# Patient Record
Sex: Female | Born: 1955 | Race: White | Hispanic: No | Marital: Married | Smoking: Former smoker
Health system: Southern US, Community
[De-identification: ages and names within clinical notes are randomized; demographics above are authoritative.]

## PROBLEM LIST (undated history)

## (undated) DIAGNOSIS — M1990 Unspecified osteoarthritis, unspecified site: Secondary | ICD-10-CM

## (undated) DIAGNOSIS — E119 Type 2 diabetes mellitus without complications: Secondary | ICD-10-CM

---

## 1998-10-18 ENCOUNTER — Other Ambulatory Visit: Admission: RE | Admit: 1998-10-18 | Discharge: 1998-10-18 | Payer: Self-pay | Admitting: Family Medicine

## 2019-11-06 DEATH — deceased

## 2020-07-25 ENCOUNTER — Emergency Department (HOSPITAL_BASED_OUTPATIENT_CLINIC_OR_DEPARTMENT_OTHER)
Admission: EM | Admit: 2020-07-25 | Discharge: 2020-07-25 | Disposition: A | Discharge status: Home / Self Care | Payer: Commercial Managed Care - PPO | Attending: Emergency Medicine | Admitting: Emergency Medicine

## 2020-07-25 ENCOUNTER — Emergency Department (HOSPITAL_BASED_OUTPATIENT_CLINIC_OR_DEPARTMENT_OTHER): Payer: Commercial Managed Care - PPO

## 2020-07-25 ENCOUNTER — Other Ambulatory Visit: Payer: Self-pay

## 2020-07-25 ENCOUNTER — Encounter (HOSPITAL_BASED_OUTPATIENT_CLINIC_OR_DEPARTMENT_OTHER): Payer: Self-pay | Admitting: Emergency Medicine

## 2020-07-25 DIAGNOSIS — S59902A Unspecified injury of left elbow, initial encounter: Secondary | ICD-10-CM | POA: Diagnosis present

## 2020-07-25 DIAGNOSIS — F17200 Nicotine dependence, unspecified, uncomplicated: Secondary | ICD-10-CM | POA: Diagnosis not present

## 2020-07-25 DIAGNOSIS — W108XXA Fall (on) (from) other stairs and steps, initial encounter: Secondary | ICD-10-CM | POA: Insufficient documentation

## 2020-07-25 DIAGNOSIS — Z23 Encounter for immunization: Secondary | ICD-10-CM | POA: Diagnosis not present

## 2020-07-25 DIAGNOSIS — S51012A Laceration without foreign body of left elbow, initial encounter: Secondary | ICD-10-CM | POA: Insufficient documentation

## 2020-07-25 DIAGNOSIS — M25422 Effusion, left elbow: Secondary | ICD-10-CM

## 2020-07-25 MED ORDER — LIDOCAINE-EPINEPHRINE 1 %-1:100000 IJ SOLN
10.0000 mL | Freq: Once | INTRAMUSCULAR | Status: AC
  Administered 2020-07-25: 10:00:00 10 mL
  Filled 2020-07-25: qty 1

## 2020-07-25 MED ORDER — FENTANYL CITRATE (PF) 100 MCG/2ML IJ SOLN
100.0000 ug | Freq: Once | INTRAMUSCULAR | Status: AC
  Administered 2020-07-25: 08:00:00 100 ug via INTRAVENOUS

## 2020-07-25 MED ORDER — OXYCODONE-ACETAMINOPHEN 5-325 MG PO TABS
1.0000 | ORAL_TABLET | Freq: Once | ORAL | Status: AC
  Administered 2020-07-25: 10:00:00 1 via ORAL
  Filled 2020-07-25: qty 1

## 2020-07-25 MED ORDER — CEPHALEXIN 500 MG PO CAPS: 500.0000 mg | ORAL_CAPSULE | Freq: Three times a day (TID) | ORAL | 0 refills | Status: AC

## 2020-07-25 MED ORDER — TETANUS-DIPHTH-ACELL PERTUSSIS 5-2.5-18.5 LF-MCG/0.5 IM SUSY
0.5000 mL | PREFILLED_SYRINGE | Freq: Once | INTRAMUSCULAR | Status: AC
  Administered 2020-07-25: 09:00:00 0.5 mL via INTRAMUSCULAR
  Filled 2020-07-25: qty 0.5

## 2020-07-25 MED ORDER — FENTANYL CITRATE (PF) 100 MCG/2ML IJ SOLN
INTRAMUSCULAR | Status: AC
  Filled 2020-07-25: qty 2

## 2020-07-25 MED ORDER — OXYCODONE-ACETAMINOPHEN 5-325 MG PO TABS: 1.0000 | ORAL_TABLET | Freq: Four times a day (QID) | ORAL | 0 refills | Status: DC | PRN

## 2020-07-25 NOTE — ED Notes (Signed)
Additional bandaging added for bleeding control

## 2020-07-25 NOTE — ED Notes (Signed)
ED Provider at bedside for suture repair 

## 2020-07-25 NOTE — ED Notes (Signed)
Pt ambulatory with steady gait, standby assist to restroom. 

## 2020-07-25 NOTE — ED Provider Notes (Signed)
[facility] EMERGENCY DEPARTMENT Provider Note   CSN: [id number] Arrival date & time: 07/25/20  0755     History Chief Complaint  Patient presents with  . Fall    Maria Rose is a 64 y.o. female.  64yo F who p/w L elbow injury. Just PTA, pt was on a 2' step when she fell and struck her L elbow, sustaining a large laceration that has bled a lot. She denies head injury or LOC. No numbness or weakness of L hand. She is L handed. No other areas of pain, no anticoagulant use. Unknown last tetanus vaccination.   The history is provided by the patient.       History reviewed. No pertinent past medical history.  There are no problems to display for this patient.   History reviewed. No pertinent surgical history.   OB History   No obstetric history on file.     No family history on file.  Social History   Tobacco Use  . Smoking status: Current Every Day Smoker  . Smokeless tobacco: Never Used  Substance Use Topics  . Alcohol use: Yes    Home Medications Prior to Admission medications   Medication Sig Start Date End Date Taking? Authorizing Provider  cephALEXin (KEFLEX) 500 MG capsule Take 1 capsule (500 mg total) by mouth 3 (three) times daily for 7 days. 07/25/20 08/01/20  Lakelyn Straus, Ambrose Finland, MD  oxyCODONE-acetaminophen (PERCOCET) 5-325 MG tablet Take 1 tablet by mouth every 6 (six) hours as needed. 07/25/20   Zenaya Ulatowski, Ambrose Finland, MD    Allergies    Patient has no known allergies.  Review of Systems   Review of Systems  Musculoskeletal: Positive for joint swelling.  Skin: Positive for wound.  Neurological: Negative for numbness.   All other systems reviewed and are negative except that which was mentioned in HPI  Physical Exam Updated Vital Signs BP (!) 119/56 (BP Location: Right Arm)   Pulse 81   Temp 98.3 F (36.8 C) (Oral)   Resp 16   Ht 5' (1.524 m)   Wt 95.3 kg   SpO2 95%   BMI 41.01 kg/m   Physical Exam Vitals and nursing  note reviewed.  Constitutional:      General: She is not in acute distress.    Appearance: She is well-developed and well-nourished.  HENT:     Head: Normocephalic and atraumatic.  Eyes:     Conjunctiva/sclera: Conjunctivae normal.  Musculoskeletal:        General: Swelling present.     Cervical back: Neck supple.     Comments: Edema over olecranon bursa with large 7cm V-shaped laceration over elbow, does not appear to involve joint space, no bone exposure; normal sensation and grip strength L hand; no tenderness RUE, BLE, or midline spine  Skin:    General: Skin is warm and dry.  Neurological:     Mental Status: She is alert and oriented to person, place, and time.  Psychiatric:        Mood and Affect: Mood and affect normal.        Judgment: Judgment normal.     Comments: Anxious, tearful     ED Results / Procedures / Treatments   Labs (all labs ordered are listed, but only abnormal results are displayed) Labs Reviewed - No data to display  EKG None  Radiology DG Elbow Complete Left  Result Date: 07/25/2020 CLINICAL DATA:  Fall off ladder. Left elbow pain enlarged laceration. EXAM: LEFT  ELBOW - COMPLETE 3+ VIEW COMPARISON:  None. FINDINGS: There is no evidence of fracture, dislocation, or joint effusion. There is no evidence of arthropathy or other focal bone abnormality. Prominent soft tissue swelling and bandage is seen along the dorsal aspect of the elbow joint. No radiopaque foreign body identified. IMPRESSION: Prominent dorsal soft tissue swelling. No evidence of fracture or radiopaque foreign body. Electronically Signed   By: Danae Orleans M.D.   On: 07/25/2020 09:25   DG Forearm Left  Result Date: 07/25/2020 CLINICAL DATA:  Fall off ladder. Left elbow pain and large laceration. Initial encounter. EXAM: LEFT FOREARM - 2 VIEW COMPARISON:  None. FINDINGS: There is no evidence of fracture or other focal bone lesions. Soft tissue swelling and bandage are seen along the  dorsal aspect of the proximal ulna and elbow joint. No evidence of radiopaque foreign body. IMPRESSION: Dorsal soft tissue swelling along the proximal ulna. No evidence of fracture or radiopaque foreign body. Electronically Signed   By: Danae Orleans M.D.   On: 07/25/2020 09:24    Procedures .Marland KitchenLaceration Repair  Date/Time: 07/25/2020 11:59 AM Performed by: Laurence Spates, MD Authorized by: Laurence Spates, MD   Consent:    Consent obtained:  Verbal   Consent given by:  Patient   Risks discussed:  Infection, pain, poor cosmetic result and poor wound healing   Alternatives discussed:  No treatment Universal protocol:    Patient identity confirmed:  Verbally with patient Anesthesia:    Anesthesia method:  Local infiltration   Local anesthetic:  Lidocaine 1% WITH epi Laceration details:    Location:  Shoulder/arm   Shoulder/arm location:  L elbow   Length (cm):  7 Pre-procedure details:    Preparation:  Patient was prepped and draped in usual sterile fashion Exploration:    Wound exploration: wound explored through full range of motion     Wound extent: no muscle damage noted, no nerve damage noted and no underlying fracture noted   Treatment:    Area cleansed with:  Povidone-iodine   Amount of cleaning:  Extensive   Irrigation solution:  Sterile saline   Irrigation volume:  1000   Irrigation method:  Pressure wash   Debridement:  None   Undermining:  None Skin repair:    Repair method:  Sutures   Suture size:  4-0   Suture material:  Nylon   Suture technique:  Horizontal mattress   Number of sutures:  9 Approximation:    Approximation:  Close Repair type:    Repair type:  Intermediate Post-procedure details:    Dressing:  Antibiotic ointment and non-adherent dressing   Procedure completion:  Tolerated well, no immediate complications Comments:     1 figure-of-eight suture in subcutaneous tissue for continuous bleeding 7 horizontal mattress sutures, 1  simple interrupted suture for skin closure   (including critical care time)  Medications Ordered in ED Medications  fentaNYL (SUBLIMAZE) injection 100 mcg (100 mcg Intravenous Given 07/25/20 0827)  Tdap (BOOSTRIX) injection 0.5 mL (0.5 mLs Intramuscular Given 07/25/20 0830)  oxyCODONE-acetaminophen (PERCOCET/ROXICET) 5-325 MG per tablet 1 tablet (1 tablet Oral Given 07/25/20 0955)  lidocaine-EPINEPHrine (XYLOCAINE W/EPI) 1 %-1:100000 (with pres) injection 10 mL (10 mLs Other Given by Other 07/25/20 1002)    ED Course  I have reviewed the triage vital signs and the nursing notes.  Pertinent imaging results that were available during my care of the patient were reviewed by me and considered in my medical decision making (see chart for  details).    MDM Rules/Calculators/A&P                          Neurovascularly intact distally, significant swelling over olecranon bursa w/ large overlying laceration. Laceration does not appear to compromise joint space and XR elbow and forearm negative for fracture. Repaired at bedside, see procedure note. Pt demonstrating full ROM on repeat exam. Will not splint in order to avoid elbow stiffness but cautioned on full flexion as this will stress the sutures in place. Because of degree of swelling to bursa and size of laceration, will have pt f/u with ortho for wound check and joint reassessment and start on abx. Discussed wound care and extensively reviewed return precautions.  Final Clinical Impression(s) / ED Diagnoses Final diagnoses:  Elbow laceration, left, initial encounter  Effusion of bursa of left elbow    Rx / DC Orders ED Discharge Orders         Ordered    cephALEXin (KEFLEX) 500 MG capsule  3 times daily        07/25/20 1157    oxyCODONE-acetaminophen (PERCOCET) 5-325 MG tablet  Every 6 hours PRN        07/25/20 1157           Starlit Raburn, Ambrose Finland, MD 07/25/20 1744

## 2020-07-25 NOTE — ED Notes (Signed)
Portable Xray at bedside.

## 2020-07-25 NOTE — ED Notes (Signed)
500 ml bolus per EDP Little

## 2020-07-25 NOTE — ED Notes (Signed)
Pt c/o neck and right side lower back pain.

## 2020-07-25 NOTE — ED Notes (Signed)
Pt requesting pain medication for increased pain, EDP Little will be updated

## 2020-07-25 NOTE — ED Triage Notes (Signed)
She fell off of a step ladder injury L arm and c/o back pain. Open injury to L elbow

## 2022-07-01 IMAGING — DX DG FOREARM 2V*L*
2 series · 2 of 2 positions shown · non-contrast
Comparison: None.

CLINICAL DATA: Fall off ladder. Left elbow pain and large
laceration. Initial encounter.

EXAM:
LEFT FOREARM - 2 VIEW

[forearm ap]
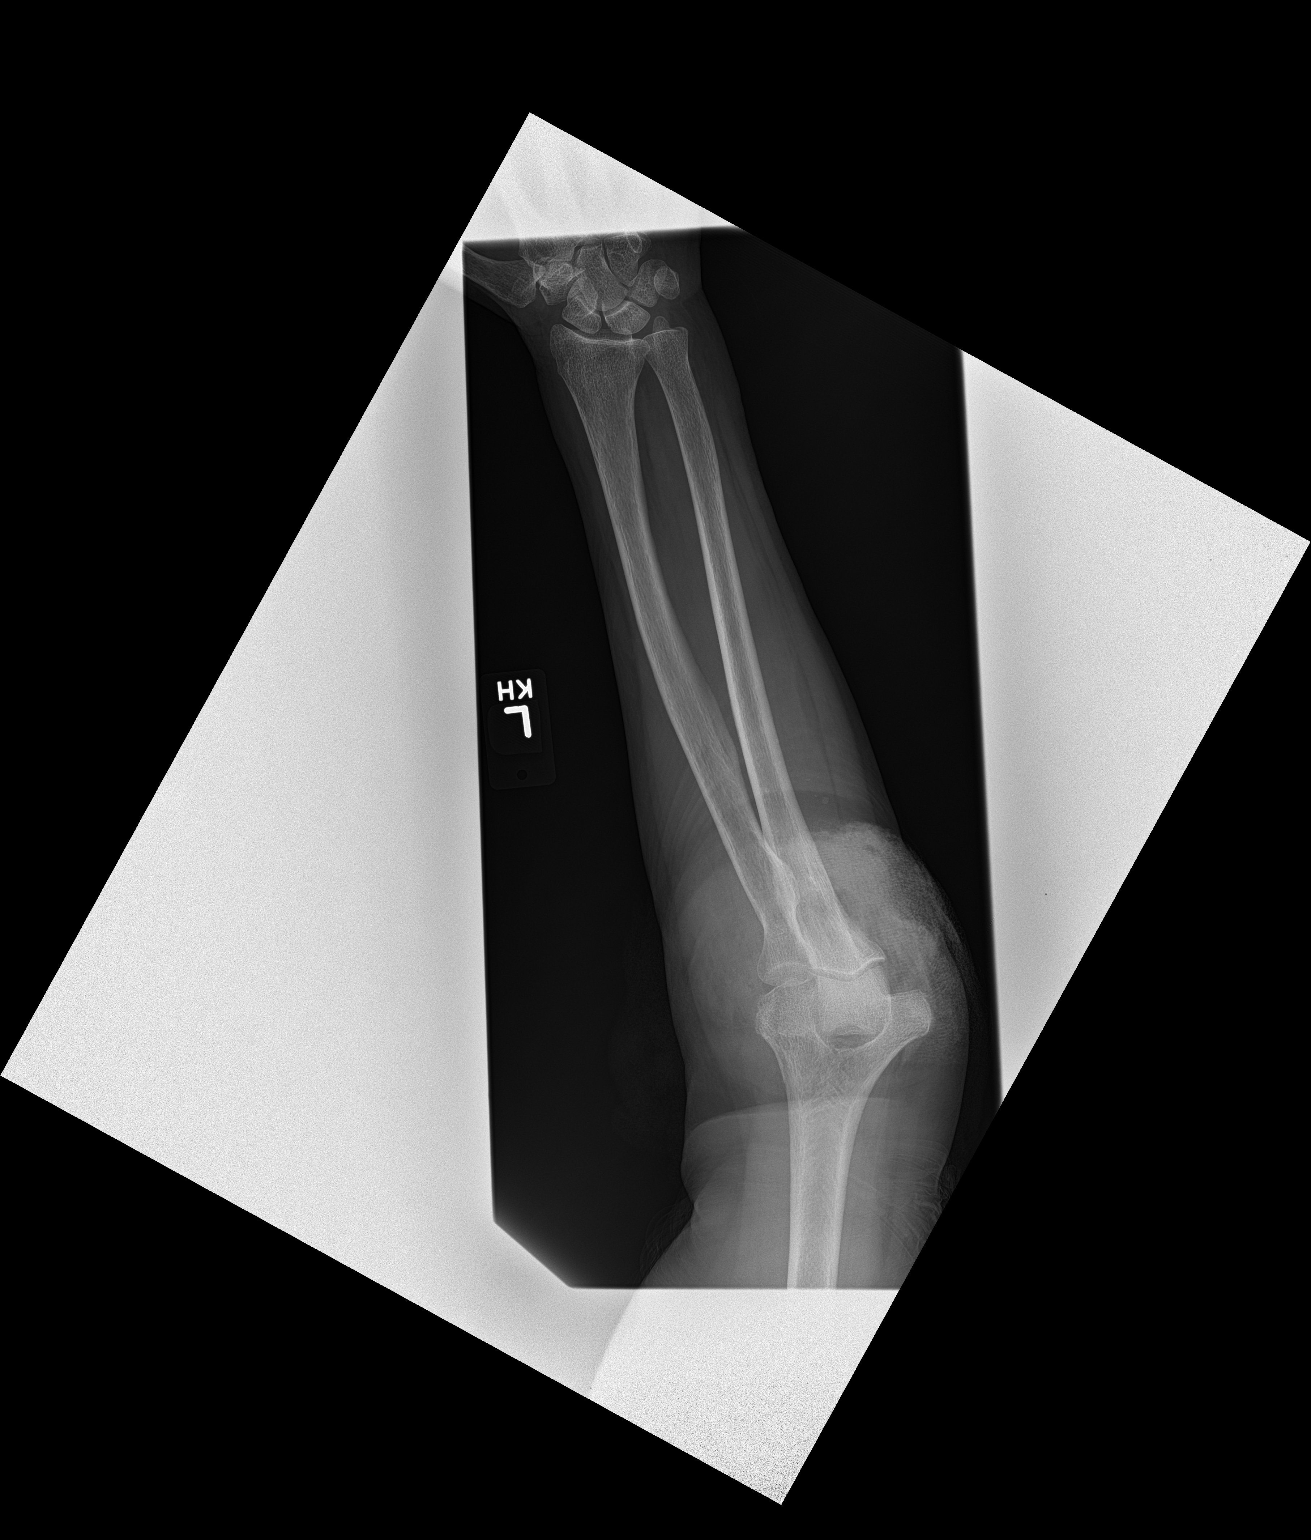

[forearm lat]
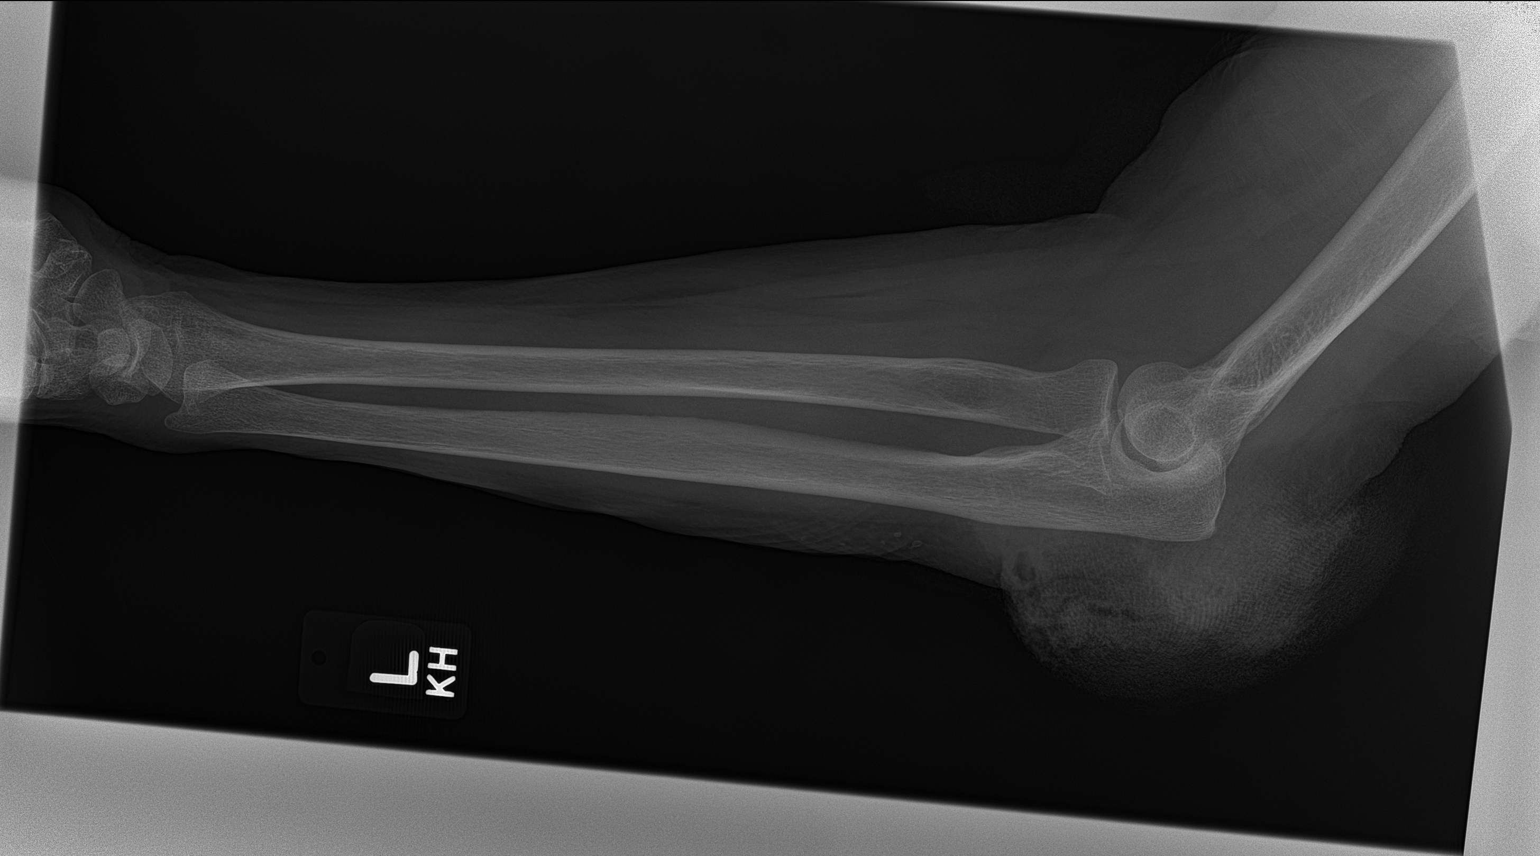

[2 of 2 positions shown; findings below may reference images not displayed]

FINDINGS: There is no evidence of fracture or other focal bone lesions. Soft
tissue swelling and bandage are seen along the dorsal aspect of the
proximal ulna and elbow joint. No evidence of radiopaque foreign
body.
IMPRESSION: Dorsal soft tissue swelling along the proximal ulna. No evidence of
fracture or radiopaque foreign body.

## 2023-05-16 ENCOUNTER — Other Ambulatory Visit: Payer: Self-pay

## 2023-05-16 ENCOUNTER — Encounter (HOSPITAL_COMMUNITY): Payer: Self-pay | Admitting: Emergency Medicine

## 2023-05-16 ENCOUNTER — Emergency Department (HOSPITAL_COMMUNITY)
Admission: EM | Admit: 2023-05-16 | Discharge: 2023-05-16 | Disposition: A | Discharge status: Home / Self Care | Payer: Commercial Managed Care - PPO | Attending: Emergency Medicine | Admitting: Emergency Medicine

## 2023-05-16 DIAGNOSIS — L304 Erythema intertrigo: Secondary | ICD-10-CM | POA: Diagnosis not present

## 2023-05-16 DIAGNOSIS — E1165 Type 2 diabetes mellitus with hyperglycemia: Secondary | ICD-10-CM | POA: Diagnosis not present

## 2023-05-16 DIAGNOSIS — R21 Rash and other nonspecific skin eruption: Secondary | ICD-10-CM | POA: Diagnosis present

## 2023-05-16 DIAGNOSIS — J340 Abscess, furuncle and carbuncle of nose: Secondary | ICD-10-CM | POA: Insufficient documentation

## 2023-05-16 DIAGNOSIS — R739 Hyperglycemia, unspecified: Secondary | ICD-10-CM

## 2023-05-16 LAB — COMPREHENSIVE METABOLIC PANEL
ALT: 21 U/L (ref 0–44)
AST: 25 U/L (ref 15–41)
Albumin: 3.8 g/dL (ref 3.5–5.0)
Alkaline Phosphatase: 78 U/L (ref 38–126)
Anion gap: 12 (ref 5–15)
BUN: 7 mg/dL — ABNORMAL LOW (ref 8–23)
CO2: 24 mmol/L (ref 22–32)
Calcium: 9 mg/dL (ref 8.9–10.3)
Chloride: 99 mmol/L (ref 98–111)
Creatinine, Ser: 0.67 mg/dL (ref 0.44–1.00)
GFR, Estimated: >60 mL/min (ref >60)
Glucose, Bld: 257 mg/dL — ABNORMAL HIGH (ref 70–99)
Potassium: 3.7 mmol/L (ref 3.5–5.1)
Sodium: 135 mmol/L (ref 135–145)
Total Bilirubin: 0.5 mg/dL (ref 0.3–1.2)
Total Protein: 7.3 g/dL (ref 6.5–8.1)

## 2023-05-16 LAB — CBC WITH DIFFERENTIAL/PLATELET
Abs Immature Granulocytes: 0.06 10*3/uL (ref 0.00–0.07)
Basophils Absolute: 0 10*3/uL (ref 0.0–0.1)
Basophils Relative: 0 %
Eosinophils Absolute: 0.2 10*3/uL (ref 0.0–0.5)
Eosinophils Relative: 2 %
HCT: 39.8 % (ref 36.0–46.0)
Hemoglobin: 11.3 g/dL — ABNORMAL LOW (ref 12.0–15.0)
Immature Granulocytes: 1 %
Lymphocytes Relative: 18 %
Lymphs Abs: 1.6 10*3/uL (ref 0.7–4.0)
MCH: 17.4 pg — ABNORMAL LOW (ref 26.0–34.0)
MCHC: 28.4 g/dL — ABNORMAL LOW (ref 30.0–36.0)
MCV: 61.4 fL — ABNORMAL LOW (ref 80.0–100.0)
Monocytes Absolute: 0.7 10*3/uL (ref 0.1–1.0)
Monocytes Relative: 7 %
Neutro Abs: 6.8 10*3/uL (ref 1.7–7.7)
Neutrophils Relative %: 72 %
Platelets: 149 10*3/uL — ABNORMAL LOW (ref 150–400)
RBC: 6.48 MIL/uL — ABNORMAL HIGH (ref 3.87–5.11)
RDW: 26.3 % — ABNORMAL HIGH (ref 11.5–15.5)
WBC: 9.3 10*3/uL (ref 4.0–10.5)
nRBC: 0 % (ref 0.0–0.2)

## 2023-05-16 MED ORDER — FLUCONAZOLE 150 MG PO TABS: 150.0000 mg | ORAL_TABLET | Freq: Every day | ORAL | 0 refills | Status: DC

## 2023-05-16 MED ORDER — CLOTRIMAZOLE 1 % EX CREA: TOPICAL_CREAM | CUTANEOUS | 1 refills | Status: DC

## 2023-05-16 MED ORDER — CEPHALEXIN 500 MG PO CAPS: 500.0000 mg | ORAL_CAPSULE | Freq: Four times a day (QID) | ORAL | 0 refills | Status: DC

## 2023-05-16 NOTE — ED Triage Notes (Signed)
Patient arrives ambulatory by POV c/o cellulitis to her nose since Friday. Patient was seen at [facility] and started on antibiotics. Was told if symptoms don't improve in 48 hours to go to ED. Patient also would like to be evaluated for rash under breast and perineum area ongoing for a long time.

## 2023-05-16 NOTE — ED Provider Notes (Signed)
[place] EMERGENCY DEPARTMENT AT [facility] Provider Note   CSN: [id number] Arrival date & time: 05/16/23  1208     History  Chief Complaint  Patient presents with   Cellulitis    Maria Rose is a 67 y.o. female.  Patient presents to the emergency department for approximately 5 days of nasal cellulitis.  Patient was seen by urgent care 2 days ago and started on Bactrim.  She states that her symptoms have improved a bit, but since still present, she was instructed to go to the emergency department after 48 hours.  No fevers, nausea or vomiting.  Swelling has drained a little bit intermittently.  She is a diabetic.  Patient also reports rash in the groin and under breasts which she would like to have checked.       Home Medications Prior to Admission medications   Medication Sig Start Date End Date Taking? Authorizing Provider  oxyCODONE-acetaminophen (PERCOCET) 5-325 MG tablet Take 1 tablet by mouth every 6 (six) hours as needed. 07/25/20   Little, Ambrose Finland, MD      Allergies    Patient has no known allergies.    Review of Systems   Review of Systems  Physical Exam Updated Vital Signs BP (!) 150/92 (BP Location: Right Arm)   Pulse (!) 102   Temp 98.7 F (37.1 C) (Oral)   Resp 18   Ht 5' (1.524 m)   Wt 95.3 kg   SpO2 95%   BMI 41.01 kg/m  Physical Exam Vitals and nursing note reviewed.  Constitutional:      Appearance: She is well-developed.  HENT:     Head: Normocephalic and atraumatic.     Nose:     Comments: Patient has cellulitic changes of the tip of the nose.  There is mild fullness of the nasal septum but no overt or obvious abscess.  No active drainage.  No facial cellulitis. Eyes:     Conjunctiva/sclera: Conjunctivae normal.  Pulmonary:     Effort: No respiratory distress.  Musculoskeletal:     Cervical back: Normal range of motion and neck supple.  Skin:    General: Skin is warm and dry.     Comments: Significant intertrigo  with some skin breakdown beneath the breasts and in the groin folds.  Few satellite lesions were noted.  Appears candidal in nature.  Neurological:     Mental Status: She is alert.     ED Results / Procedures / Treatments   Labs (all labs ordered are listed, but only abnormal results are displayed) Labs Reviewed  COMPREHENSIVE METABOLIC PANEL - Abnormal; Notable for the following components:      Result Value   Glucose, Bld 257 (*)    BUN 7 (*)    All other components within normal limits  CBC WITH DIFFERENTIAL/PLATELET - Abnormal; Notable for the following components:   RBC 6.48 (*)    Hemoglobin 11.3 (*)    MCV 61.4 (*)    MCH 17.4 (*)    MCHC 28.4 (*)    RDW 26.3 (*)    Platelets 149 (*)    All other components within normal limits    EKG None  Radiology No results found.  Procedures Procedures    Medications Ordered in ED Medications - No data to display  ED Course/ Medical Decision Making/ A&P    Patient seen and examined. History obtained directly from patient.  Due to the sensitive location, patient discussed with and seen  by Dr. Rush Landmark.  Labs/EKG: Personally reviewed and interpreted: CBC with normal white blood cell count, mild anemia with hemoglobin 11.3 microcytic; CMP glucose 257 with normal anion gap.  Imaging: None  Medications/Fluids: None ordered  Most recent vital signs reviewed and are as follows: BP (!) 150/92 (BP Location: Right Arm)   Pulse (!) 102   Temp 98.7 F (37.1 C) (Oral)   Resp 18   Ht 5' (1.524 m)   Wt 95.3 kg   SpO2 95%   BMI 41.01 kg/m   Initial impression: Patient with cellulitis of the nose, possible mild septal abscess, but with any minimal fullness.  No obvious areas to drain today.  She also has intertrigo.  Patient has had some improvement over the past 48 hours on Bactrim.  Will add Keflex to broaden coverage.  Patient will monitor.  Will start clotrimazole topically for intertrigo and also give a dose of Diflucan  given concurrent antibiotic use and recently poorly controlled blood sugars.  The patient was urged to return to the Emergency Department urgently with worsening pain, swelling, expanding erythema especially if it streaks away from the affected area, fever, or if they have any other concerns.   The patient was urged to call ENT for follow-up in 48 hours for wound recheck if the area is not significantly improved.  The patient verbalized understanding and stated agreement with this plan.                                    Medical Decision Making Amount and/or Complexity of Data Reviewed Labs: ordered.   Cellulitis of nose.  Seems to be improving on antibiotics, will broaden coverage with Keflex.  No indication for I&D at this time.  Intertrigo with treatment plan as above.  Patient appears well, nontoxic.  Do not feel that she would benefit from advanced imaging today or from emergent ENT consultation.        Final Clinical Impression(s) / ED Diagnoses Final diagnoses:  Cellulitis of nasal tip  Intertrigo  Hyperglycemia    Rx / DC Orders ED Discharge Orders          Ordered    cephALEXin (KEFLEX) 500 MG capsule  4 times daily        05/16/23 1444    fluconazole (DIFLUCAN) 150 MG tablet  Daily        05/16/23 1444    clotrimazole (LOTRIMIN) 1 % cream        05/16/23 1446              Renne Crigler, PA-C 05/16/23 1451    Tegeler, Canary Brim, MD 05/16/23 [phone]

## 2023-05-16 NOTE — Discharge Instructions (Addendum)
Please read and follow all provided instructions.  Your diagnoses today include:  1. Cellulitis of nasal tip   2. Intertrigo   3. Hyperglycemia     Tests performed today include: Complete blood cell count: Normal white blood cells Complete metabolic panel: blood sugar was high at 257 Vital signs. See below for your results today.   Home care instructions:  Follow any educational materials contained in this packet  Follow-up instructions: Return to the Emergency Department in 48 hours for a recheck if your symptoms are not significantly improved.  Please follow-up with your primary care provider in the next 1 week for further evaluation of your symptoms.   Return instructions:  Return to the Emergency Department if you have: Fever Worsening symptoms Worsening pain Worsening swelling Redness of the skin that moves away from the affected area, especially if it streaks away from the affected area  Any other emergent concerns  Additional Information: If you have recurrent abscesses, try both the following. Use a Qtip to apply an over-the-counter antibiotic to the inside of your nostrils, twice a day for 5 days. Wash your body with over-the-counter Hibaclens once a day for one week and then once every two weeks. This can reduce the amount of bacterial on your skin that causes boils and lead to fewer boils. If you continue to have multiple or recurrent boils, you should see a dermatologist (skin doctor).   Your vital signs today were: BP (!) 150/92 (BP Location: Right Arm)   Pulse (!) 102   Temp 98.7 F (37.1 C) (Oral)   Resp 18   Ht 5' (1.524 m)   Wt 95.3 kg   SpO2 95%   BMI 41.01 kg/m  If your blood pressure (BP) was elevated above 135/85 this visit, please have this repeated by your doctor within one month. --------------

## 2023-06-26 DIAGNOSIS — J3489 Other specified disorders of nose and nasal sinuses: Secondary | ICD-10-CM | POA: Insufficient documentation

## 2023-06-26 DIAGNOSIS — Z872 Personal history of diseases of the skin and subcutaneous tissue: Secondary | ICD-10-CM | POA: Insufficient documentation

## 2023-07-30 ENCOUNTER — Emergency Department (HOSPITAL_COMMUNITY): Payer: Commercial Managed Care - PPO

## 2023-07-30 ENCOUNTER — Encounter (HOSPITAL_COMMUNITY): Payer: Self-pay | Admitting: Emergency Medicine

## 2023-07-30 ENCOUNTER — Telehealth (HOSPITAL_COMMUNITY): Payer: Self-pay | Admitting: Pharmacy Technician

## 2023-07-30 ENCOUNTER — Other Ambulatory Visit: Payer: Self-pay

## 2023-07-30 ENCOUNTER — Other Ambulatory Visit (HOSPITAL_COMMUNITY): Payer: Self-pay

## 2023-07-30 ENCOUNTER — Inpatient Hospital Stay (HOSPITAL_COMMUNITY)
Admission: EM | Admit: 2023-07-30 | Discharge: 2023-08-12 | DRG: 263 | Disposition: A | Discharge status: Home / Self Care | Payer: Commercial Managed Care - PPO | Attending: Internal Medicine | Admitting: Internal Medicine

## 2023-07-30 ENCOUNTER — Observation Stay (HOSPITAL_COMMUNITY): Payer: Commercial Managed Care - PPO

## 2023-07-30 DIAGNOSIS — R55 Syncope and collapse: Secondary | ICD-10-CM | POA: Diagnosis not present

## 2023-07-30 DIAGNOSIS — R739 Hyperglycemia, unspecified: Secondary | ICD-10-CM

## 2023-07-30 DIAGNOSIS — A419 Sepsis, unspecified organism: Secondary | ICD-10-CM | POA: Diagnosis present

## 2023-07-30 DIAGNOSIS — I868 Varicose veins of other specified sites: Secondary | ICD-10-CM

## 2023-07-30 DIAGNOSIS — K920 Hematemesis: Secondary | ICD-10-CM | POA: Diagnosis not present

## 2023-07-30 DIAGNOSIS — Z79899 Other long term (current) drug therapy: Secondary | ICD-10-CM

## 2023-07-30 DIAGNOSIS — D696 Thrombocytopenia, unspecified: Secondary | ICD-10-CM | POA: Diagnosis present

## 2023-07-30 DIAGNOSIS — K55051 Focal (segmental) acute (reversible) ischemia of intestine, part unspecified: Secondary | ICD-10-CM | POA: Diagnosis not present

## 2023-07-30 DIAGNOSIS — I9789 Other postprocedural complications and disorders of the circulatory system, not elsewhere classified: Secondary | ICD-10-CM | POA: Diagnosis not present

## 2023-07-30 DIAGNOSIS — R578 Other shock: Secondary | ICD-10-CM | POA: Diagnosis not present

## 2023-07-30 DIAGNOSIS — K7469 Other cirrhosis of liver: Secondary | ICD-10-CM

## 2023-07-30 DIAGNOSIS — I864 Gastric varices: Principal | ICD-10-CM | POA: Insufficient documentation

## 2023-07-30 DIAGNOSIS — I8501 Esophageal varices with bleeding: Secondary | ICD-10-CM

## 2023-07-30 DIAGNOSIS — F17210 Nicotine dependence, cigarettes, uncomplicated: Secondary | ICD-10-CM | POA: Diagnosis present

## 2023-07-30 DIAGNOSIS — I700 Atherosclerosis of aorta: Secondary | ICD-10-CM | POA: Diagnosis present

## 2023-07-30 DIAGNOSIS — D638 Anemia in other chronic diseases classified elsewhere: Secondary | ICD-10-CM | POA: Diagnosis present

## 2023-07-30 DIAGNOSIS — I8511 Secondary esophageal varices with bleeding: Secondary | ICD-10-CM | POA: Diagnosis not present

## 2023-07-30 DIAGNOSIS — E119 Type 2 diabetes mellitus without complications: Secondary | ICD-10-CM

## 2023-07-30 DIAGNOSIS — R6510 Systemic inflammatory response syndrome (SIRS) of non-infectious origin without acute organ dysfunction: Secondary | ICD-10-CM | POA: Diagnosis not present

## 2023-07-30 DIAGNOSIS — Z823 Family history of stroke: Secondary | ICD-10-CM

## 2023-07-30 DIAGNOSIS — I8500 Esophageal varices without bleeding: Secondary | ICD-10-CM

## 2023-07-30 DIAGNOSIS — K7581 Nonalcoholic steatohepatitis (NASH): Secondary | ICD-10-CM | POA: Diagnosis present

## 2023-07-30 DIAGNOSIS — D735 Infarction of spleen: Secondary | ICD-10-CM

## 2023-07-30 DIAGNOSIS — K922 Gastrointestinal hemorrhage, unspecified: Secondary | ICD-10-CM

## 2023-07-30 DIAGNOSIS — K7460 Unspecified cirrhosis of liver: Secondary | ICD-10-CM | POA: Insufficient documentation

## 2023-07-30 DIAGNOSIS — J84112 Idiopathic pulmonary fibrosis: Secondary | ICD-10-CM | POA: Diagnosis present

## 2023-07-30 DIAGNOSIS — I6310 Cerebral infarction due to embolism of unspecified precerebral artery: Secondary | ICD-10-CM

## 2023-07-30 DIAGNOSIS — R791 Abnormal coagulation profile: Secondary | ICD-10-CM | POA: Diagnosis present

## 2023-07-30 DIAGNOSIS — K55069 Acute infarction of intestine, part and extent unspecified: Secondary | ICD-10-CM | POA: Insufficient documentation

## 2023-07-30 DIAGNOSIS — Y838 Other surgical procedures as the cause of abnormal reaction of the patient, or of later complication, without mention of misadventure at the time of the procedure: Secondary | ICD-10-CM | POA: Diagnosis not present

## 2023-07-30 DIAGNOSIS — K5900 Constipation, unspecified: Secondary | ICD-10-CM | POA: Diagnosis not present

## 2023-07-30 DIAGNOSIS — G936 Cerebral edema: Secondary | ICD-10-CM | POA: Diagnosis present

## 2023-07-30 DIAGNOSIS — E669 Obesity, unspecified: Secondary | ICD-10-CM | POA: Diagnosis present

## 2023-07-30 DIAGNOSIS — R911 Solitary pulmonary nodule: Secondary | ICD-10-CM | POA: Diagnosis present

## 2023-07-30 DIAGNOSIS — K766 Portal hypertension: Secondary | ICD-10-CM | POA: Diagnosis present

## 2023-07-30 DIAGNOSIS — K921 Melena: Secondary | ICD-10-CM | POA: Diagnosis not present

## 2023-07-30 DIAGNOSIS — K7290 Hepatic failure, unspecified without coma: Secondary | ICD-10-CM | POA: Insufficient documentation

## 2023-07-30 DIAGNOSIS — R29700 NIHSS score 0: Secondary | ICD-10-CM | POA: Diagnosis present

## 2023-07-30 DIAGNOSIS — D62 Acute posthemorrhagic anemia: Secondary | ICD-10-CM | POA: Diagnosis not present

## 2023-07-30 DIAGNOSIS — I639 Cerebral infarction, unspecified: Secondary | ICD-10-CM

## 2023-07-30 DIAGNOSIS — E1100 Type 2 diabetes mellitus with hyperosmolarity without nonketotic hyperglycemic-hyperosmolar coma (NKHHC): Secondary | ICD-10-CM

## 2023-07-30 DIAGNOSIS — Z6830 Body mass index (BMI) 30.0-30.9, adult: Secondary | ICD-10-CM

## 2023-07-30 DIAGNOSIS — Z833 Family history of diabetes mellitus: Secondary | ICD-10-CM

## 2023-07-30 DIAGNOSIS — R233 Spontaneous ecchymoses: Secondary | ICD-10-CM | POA: Diagnosis present

## 2023-07-30 DIAGNOSIS — W19XXXA Unspecified fall, initial encounter: Secondary | ICD-10-CM | POA: Diagnosis present

## 2023-07-30 DIAGNOSIS — D509 Iron deficiency anemia, unspecified: Secondary | ICD-10-CM | POA: Insufficient documentation

## 2023-07-30 HISTORY — DX: Unspecified osteoarthritis, unspecified site: M19.90

## 2023-07-30 HISTORY — DX: Type 2 diabetes mellitus without complications: E11.9

## 2023-07-30 LAB — I-STAT VENOUS BLOOD GAS, ED
Acid-base deficit: 5 mmol/L — ABNORMAL HIGH (ref 0.0–2.0)
Bicarbonate: 20.3 mmol/L (ref 20.0–28.0)
Calcium, Ion: 1.04 mmol/L — ABNORMAL LOW (ref 1.15–1.40)
HCT: 29 % — ABNORMAL LOW (ref 36.0–46.0)
Hemoglobin: 9.9 g/dL — ABNORMAL LOW (ref 12.0–15.0)
O2 Saturation: 90 %
Potassium: 4.5 mmol/L (ref 3.5–5.1)
Sodium: 137 mmol/L (ref 135–145)
TCO2: 21 mmol/L — ABNORMAL LOW (ref 22–32)
pCO2, Ven: 35.3 mm[Hg] — ABNORMAL LOW (ref 44–60)
pH, Ven: 7.367 (ref 7.25–7.43)
pO2, Ven: 60 mm[Hg] — ABNORMAL HIGH (ref 32–45)

## 2023-07-30 LAB — HEPATIC FUNCTION PANEL
ALT: 24 U/L (ref 0–44)
AST: 29 U/L (ref 15–41)
Albumin: 3.2 g/dL — ABNORMAL LOW (ref 3.5–5.0)
Alkaline Phosphatase: 59 U/L (ref 38–126)
Bilirubin, Direct: <0.1 mg/dL (ref 0.0–0.2)
Total Bilirubin: 0.7 mg/dL (ref <1.2)
Total Protein: 6 g/dL — ABNORMAL LOW (ref 6.5–8.1)

## 2023-07-30 LAB — CBC
HCT: 36.1 % (ref 36.0–46.0)
Hemoglobin: 10.3 g/dL — ABNORMAL LOW (ref 12.0–15.0)
MCH: 19 pg — ABNORMAL LOW (ref 26.0–34.0)
MCHC: 28.5 g/dL — ABNORMAL LOW (ref 30.0–36.0)
MCV: 66.6 fL — ABNORMAL LOW (ref 80.0–100.0)
Platelets: 188 10*3/uL (ref 150–400)
RBC: 5.42 MIL/uL — ABNORMAL HIGH (ref 3.87–5.11)
RDW: 22.3 % — ABNORMAL HIGH (ref 11.5–15.5)
WBC: 19.7 10*3/uL — ABNORMAL HIGH (ref 4.0–10.5)
nRBC: 0 % (ref 0.0–0.2)

## 2023-07-30 LAB — URINALYSIS, ROUTINE W REFLEX MICROSCOPIC
Bilirubin Urine: NEGATIVE
Glucose, UA: >=500 mg/dL — AB
Hgb urine dipstick: NEGATIVE
Ketones, ur: NEGATIVE mg/dL
Leukocytes,Ua: NEGATIVE
Nitrite: NEGATIVE
Protein, ur: 100 mg/dL — AB
Specific Gravity, Urine: 1.031 — ABNORMAL HIGH (ref 1.005–1.030)
pH: 5 (ref 5.0–8.0)

## 2023-07-30 LAB — MAGNESIUM: Magnesium: 1.8 mg/dL (ref 1.7–2.4)

## 2023-07-30 LAB — CBG MONITORING, ED
Glucose-Capillary: 405 mg/dL — ABNORMAL HIGH (ref 70–99)
Glucose-Capillary: 356 mg/dL — ABNORMAL HIGH (ref 70–99)

## 2023-07-30 LAB — BETA-HYDROXYBUTYRIC ACID: Beta-Hydroxybutyric Acid: 0.21 mmol/L (ref 0.05–0.27)

## 2023-07-30 LAB — BASIC METABOLIC PANEL
Anion gap: 10 (ref 5–15)
BUN: 13 mg/dL (ref 8–23)
CO2: 18 mmol/L — ABNORMAL LOW (ref 22–32)
Calcium: 8.5 mg/dL — ABNORMAL LOW (ref 8.9–10.3)
Chloride: 103 mmol/L (ref 98–111)
Creatinine, Ser: 0.74 mg/dL (ref 0.44–1.00)
GFR, Estimated: >60 mL/min (ref >60)
Glucose, Bld: 433 mg/dL — ABNORMAL HIGH (ref 70–99)
Potassium: 4.3 mmol/L (ref 3.5–5.1)
Sodium: 131 mmol/L — ABNORMAL LOW (ref 135–145)

## 2023-07-30 LAB — OSMOLALITY: Osmolality: 312 mosm/kg — ABNORMAL HIGH (ref 275–295)

## 2023-07-30 LAB — TROPONIN I (HIGH SENSITIVITY)
Troponin I (High Sensitivity): 89 ng/L — ABNORMAL HIGH (ref <18)
Troponin I (High Sensitivity): 86 ng/L — ABNORMAL HIGH (ref <18)

## 2023-07-30 LAB — CK: Total CK: 69 U/L (ref 38–234)

## 2023-07-30 LAB — GLUCOSE, CAPILLARY
Glucose-Capillary: 239 mg/dL — ABNORMAL HIGH (ref 70–99)
Glucose-Capillary: 291 mg/dL — ABNORMAL HIGH (ref 70–99)

## 2023-07-30 LAB — HEMOGLOBIN A1C
Hgb A1c MFr Bld: 11.4 % — ABNORMAL HIGH (ref 4.8–5.6)
Mean Plasma Glucose: 280.48 mg/dL

## 2023-07-30 LAB — LACTIC ACID, PLASMA
Lactic Acid, Venous: 3.8 mmol/L (ref 0.5–1.9)
Lactic Acid, Venous: 2.7 mmol/L (ref 0.5–1.9)

## 2023-07-30 LAB — HIV ANTIBODY (ROUTINE TESTING W REFLEX): HIV Screen 4th Generation wRfx: NONREACTIVE

## 2023-07-30 MED ORDER — LACTATED RINGERS IV BOLUS
1000.0000 mL | Freq: Once | INTRAVENOUS | Status: AC
  Administered 2023-07-30: 1000 mL via INTRAVENOUS

## 2023-07-30 MED ORDER — SODIUM CHLORIDE 0.9 % IV SOLN
2.0000 g | Freq: Once | INTRAVENOUS | Status: AC
  Administered 2023-07-30: 2 g via INTRAVENOUS
  Filled 2023-07-30: qty 12.5

## 2023-07-30 MED ORDER — METOCLOPRAMIDE HCL 5 MG/ML IJ SOLN
10.0000 mg | Freq: Once | INTRAMUSCULAR | Status: AC
  Administered 2023-07-30: 10 mg via INTRAVENOUS
  Filled 2023-07-30: qty 2

## 2023-07-30 MED ORDER — LACTATED RINGERS IV BOLUS
500.0000 mL | Freq: Once | INTRAVENOUS | Status: AC
  Administered 2023-07-30: 500 mL via INTRAVENOUS

## 2023-07-30 MED ORDER — INSULIN ASPART 100 UNIT/ML IJ SOLN
0.0000 [IU] | Freq: Three times a day (TID) | INTRAMUSCULAR | Status: DC
Start: 2023-07-30 — End: 2023-07-31
  Administered 2023-07-30: 5 [IU] via SUBCUTANEOUS

## 2023-07-30 MED ORDER — LACTATED RINGERS IV BOLUS
2000.0000 mL | Freq: Once | INTRAVENOUS | Status: AC
  Administered 2023-07-30: 2000 mL via INTRAVENOUS

## 2023-07-30 MED ORDER — GADOBUTROL 1 MMOL/ML IV SOLN
8.0000 mL | Freq: Once | INTRAVENOUS | Status: AC | PRN
  Administered 2023-07-30: 8 mL via INTRAVENOUS

## 2023-07-30 MED ORDER — METRONIDAZOLE 500 MG/100ML IV SOLN
500.0000 mg | Freq: Once | INTRAVENOUS | Status: AC
  Administered 2023-07-30: 500 mg via INTRAVENOUS
  Filled 2023-07-30: qty 100

## 2023-07-30 MED ORDER — DIPHENHYDRAMINE HCL 50 MG/ML IJ SOLN
25.0000 mg | Freq: Once | INTRAMUSCULAR | Status: AC
  Administered 2023-07-30: 25 mg via INTRAVENOUS
  Filled 2023-07-30: qty 1

## 2023-07-30 MED ORDER — VANCOMYCIN HCL 1750 MG/350ML IV SOLN
1750.0000 mg | Freq: Once | INTRAVENOUS | Status: AC
  Administered 2023-07-30: 1750 mg via INTRAVENOUS
  Filled 2023-07-30: qty 350

## 2023-07-30 MED ORDER — INSULIN GLARGINE-YFGN 100 UNIT/ML ~~LOC~~ SOLN
16.0000 [IU] | Freq: Every day | SUBCUTANEOUS | Status: DC
Start: 2023-07-30 — End: 2023-07-31
  Administered 2023-07-30: 16 [IU] via SUBCUTANEOUS
  Filled 2023-07-30: qty 0.16

## 2023-07-30 MED ORDER — INSULIN ASPART 100 UNIT/ML IJ SOLN
0.0000 [IU] | Freq: Every day | INTRAMUSCULAR | Status: DC
  Administered 2023-07-30: 3 [IU] via SUBCUTANEOUS

## 2023-07-30 MED ORDER — LACTATED RINGERS IV SOLN: INTRAVENOUS | Status: DC

## 2023-07-30 MED ORDER — IOHEXOL 350 MG/ML SOLN
75.0000 mL | Freq: Once | INTRAVENOUS | Status: AC | PRN
  Administered 2023-07-30: 75 mL via INTRAVENOUS

## 2023-07-30 MED ORDER — VANCOMYCIN HCL IN DEXTROSE 1-5 GM/200ML-% IV SOLN: 1000.0000 mg | Freq: Once | INTRAVENOUS | Status: DC

## 2023-07-30 NOTE — ED Notes (Signed)
MD Durwin Nora made aware of lactic 3.8

## 2023-07-30 NOTE — H&P (Signed)
Date: 07/30/2023               Patient Name:  Maria Rose MRN: [id number]  DOB: 07-Nov-1955 Age / Sex: 67 y.o., female   PCP: Pcp, No         Medical Service: Internal Medicine Teaching Service         Attending Physician: Dr. Ginnie Smart, MD      First Contact: Morrie Sheldon, MD     Second Contact: Olegario Messier, MD         After Hours (After 5p/  First Contact Pager: [phone]  weekends / holidays): Second Contact Pager: [phone]   SUBJECTIVE   Chief Complaint: Syncope  History of Present Illness: Maria Rose is a 67 YO F with no past medical history presenting to the ED with a three day history of headache and syncopal episode. Around 3 days ago, she began experiencing a headache in her right orbital region. She endorsed the pain as 9/10 and sharp in nature. She reports similar episodes occurring roughly 20-30 years ago. This morning, the patient also noted that while trying to get ice from the fridge this morning, she started to shake. She hit her head on the ground, and did not remember the episodes but noted that it lasted several minutes. After the episode, she felt disoriented and denied any incontinence. Per the patient's husband, she has also been experiencing nausea over the past few days. After the episode, the patient had no rapid breathing but was answering questions with only one word. The patient has also endorses polyuria and xerostomia but denied any fevers, chills, stomach pain, vision changes, or speaking changes. She denies any sick contacts or recent illnesses.   ED Course: BMP notable for elevated glucose of 433, but beta-hydroxybutyric acid was WNL. Bicarb decreased to 18. Leukocytosis of 19.7 so given antibiotics. UA notable for glucosuria. Tropes elevated at 89. Lactic acid elevated at 3.8. Patient also received CT head wo contrast, ct cervical spine wo contrast, and ct chest abdomen and pelvis w contrast.    Meds:  None  Past Medical  History None  History reviewed. No pertinent surgical history.  Social:  Lives With: Son and husband Occupation: Retired  Level of Function: Independent of ADLs and iADLS PCP: None - has not seen a doctor since 2017 Substances: 40 pack year history; denies any recreational drug use; occasionally will have a glass of wine  Family History:  Father: Strokes and diabetes Mother: Strokes  Allergies: Allergies as of 07/30/2023   (No Known Allergies)    Review of Systems: A complete ROS was negative except as per HPI.   OBJECTIVE:   Physical Exam: Blood pressure (!) 145/69, pulse (!) 108, temperature 98.1 F (36.7 C), temperature source Oral, resp. rate 16, height 5\' 6"  (1.676 m), weight 81.6 kg, SpO2 100%.  Constitutional: well-appearing sitting in no acute distress HENT: normocephalic atraumatic, mucous membranes moist Eyes: conjunctiva non-erythematous Neck: supple Cardiovascular: regular rate and rhythm, no m/r/g Pulmonary/Chest: normal work of breathing on room air, lungs clear to auscultation bilaterally Abdominal: soft, non-tender, non-distended MSK: normal bulk and tone Neurological: alert & oriented x 3, 5/5 strength in bilateral upper and lower extremities, dysmetria noted on finger-to-nose test Skin: warm and dry  Labs: CBC    Component Value Date/Time   WBC 19.7 (H) 07/30/2023 1219   RBC 5.42 (H) 07/30/2023 1219   HGB 9.9 (L) 07/30/2023 1508   HCT 29.0 (L) 07/30/2023 1508   PLT  188 07/30/2023 1219   MCV 66.6 (L) 07/30/2023 1219   MCH 19.0 (L) 07/30/2023 1219   MCHC 28.5 (L) 07/30/2023 1219   RDW 22.3 (H) 07/30/2023 1219   LYMPHSABS 1.6 05/16/2023 1246   MONOABS 0.7 05/16/2023 1246   EOSABS 0.2 05/16/2023 1246   BASOSABS 0.0 05/16/2023 1246    CMP     Component Value Date/Time   NA 137 07/30/2023 1508   K 4.5 07/30/2023 1508   CL 103 07/30/2023 1219   CO2 18 (L) 07/30/2023 1219   GLUCOSE 433 (H) 07/30/2023 1219   BUN 13 07/30/2023 1219    CREATININE 0.74 07/30/2023 1219   CALCIUM 8.5 (L) 07/30/2023 1219   PROT 6.0 (L) 07/30/2023 1219   ALBUMIN 3.2 (L) 07/30/2023 1219   AST 29 07/30/2023 1219   ALT 24 07/30/2023 1219   ALKPHOS 59 07/30/2023 1219   BILITOT 0.7 07/30/2023 1219   GFRNONAA >60 07/30/2023 1219    Imaging:  CT Head WO Contrast IMPRESSION: 1. Cortical based hypodensity of the right parieto-occipital junction measuring 2.4 x 2.2 cm. This is concerning for acute or subacute infarct or alternately underlying mass. Recommend MRI for further evaluation. 2. No evidence of hemorrhage or mass effect. 3. Soft tissue contusion of the left scalp vertex. 4. No fracture or static subluxation of the cervical spine. 5. Moderate disc degenerative disease from C5-C7.  CT Cervical Spine WO Contrast  IMPRESSION: 1. Cortical based hypodensity of the right parieto-occipital junction measuring 2.4 x 2.2 cm. This is concerning for acute or subacute infarct or alternately underlying mass. Recommend MRI for further evaluation. 2. No evidence of hemorrhage or mass effect. 3. Soft tissue contusion of the left scalp vertex. 4. No fracture or static subluxation of the cervical spine. 5. Moderate disc degenerative disease from C5-C7.  CT Chest Abdomen Pelvis W Contrast  IMPRESSION: 1. No CT evidence of acute traumatic injury to the chest, abdomen, or pelvis. 2. Mild pulmonary fibrosis in a pattern with apical to basal gradient featuring irregular peripheral interstitial opacity and septal thickening with minimal traction bronchiectasis and minimal areas of subpleural bronchiolectasis of the lung bases. 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 3. Small irregular nodule of the posterior left apex measuring 0.6 cm. Non-contrast chest CT at 6-12 months is recommended. If the nodule is stable at time of repeat CT, then future CT at 18-24 months (from today's scan) is considered optional for low-risk patients, but is  recommended for high-risk patients. This recommendation follows the consensus statement: Guidelines for Management of Incidental Pulmonary Nodules Detected on CT Images: From the Fleischner Society 2017; Radiology 2017; 284:228-243. 4. Cirrhosis. 5. 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 6. Sigmoid diverticulosis without evidence of acute diverticulitis.  EKG: personally reviewed my interpretation is sinus tachycardia.   ASSESSMENT & PLAN:   Assessment & Plan by Problem: Principal Problem:   Sepsis (HCC) Active Problems:   Syncope and collapse   Hyperglycemia   SIRS (systemic inflammatory response syndrome) (HCC)  Maria Rose is a 67 YO F with no past medical history presenting to the ED with a three day history of headache and syncopal episode  #Concern for sepsis  Patient noted to have elevated lactic acid of 3.8 with leukocytosis of 19.7. UA largely unremarkable with exception of glucosuria. Current source is unknown but blood cultures drawn. Patient has remained tachycardic and hypertensive but has been afebrile. She was given a dose of cefepime and is currently on  IV flagyl and vancomycin. -Continue IV vanc and Flagyl -F/U blood cultures -Trend CBC  -Trend lactic acid   #Syncopal Episode #Acute/subacute stroke vs underlying brain mass CT head notable for hypodensity of the right parieto-occipital junction measuring 2.4 x 2.2 cm concerning for acute or subacute infarct or alternately underlying mass. Patient received MRI for further evaluation. Patient also noted to have had dysmetria on physical examination. Etiology of syncopal episodes remains unknown.  -F/U MRI   #Hyperglycemia #Diabetes A1c noted to have been 11.4. No previous diagnosis of diabetes documented in record. Glucose elevated at 433 with decreased bicarb of 18. I suspect the patient was near DKA, but her beta-hydroxybutyric acid was WNL.  - Continue SSI  Diet: Heart Healthy Code:  Full  Prior to Admission Living Arrangement: Home, living son and husband Anticipated Discharge Location: Home Barriers to Discharge: Medication management   Dispo: Admit patient to Observation with expected length of stay less than 2 midnights.  Signed: Morrie Sheldon, MD Internal Medicine Resident PGY-1  07/30/2023, 5:25 PM

## 2023-07-30 NOTE — ED Notes (Signed)
ED TO INPATIENT HANDOFF REPORT  ED Nurse Name and Phone #: Morrie Sheldon RN [phone]  S Name/Age/Gender Maria Rose 67 y.o. female Room/Bed: 004C/004C  Code Status   Code Status: Full Code  Home/SNF/Other Home Patient oriented to: self, place, time, and situation Is this baseline? Yes   Triage Complete: Triage complete  Chief Complaint Sepsis [facility]) [A41.9]  Triage Note Pt BIB EMS from home pt has had increased urination, increased thirst, for several days. Pt also had syncopal episode at home. Family found pt on ground. Pt states she remembers falling and did not hit head. Pt states she just feels off. EMS CBG read High. Pt states she felt herself shaking prior to falling. Not diagnosed with diabetes.    Allergies No Known Allergies  Level of Care/Admitting Diagnosis ED Disposition     ED Disposition  Admit   Condition  --   Comment  Hospital Area: [facility] [[id number]]  Level of Care: Telemetry Medical [104]  May place patient in observation at [facility] or [facility] if equivalent level of care is available:: No  Covid Evaluation: Asymptomatic - no recent exposure (last 10 days) testing not required  Diagnosis: Sepsis [facility]) [[id number]]  Admitting Physician: Ginnie Smart [[id number]]  Attending Physician: HATCHER, JEFFREY C [[id number]]          B Medical/Surgery History History reviewed. No pertinent past medical history. History reviewed. No pertinent surgical history.   A IV Location/Drains/Wounds Patient Lines/Drains/Airways Status     Active Line/Drains/Airways     Name Placement date Placement time Site Days   Peripheral IV 07/30/23 20 G Anterior;Right Forearm 07/30/23  1215  Forearm  less than 1   Peripheral IV 07/30/23 Left;Posterior Hand 07/30/23  1419  Hand  less than 1            Intake/Output Last 24 hours No intake or output data in the 24 hours ending 07/30/23 1658  Labs/Imaging Results for orders placed or performed  during the hospital encounter of 07/30/23 (from the past 48 hours)  CBG monitoring, ED     Status: Abnormal   Collection Time: 07/30/23 11:26 AM  Result Value Ref Range   Glucose-Capillary 405 (H) 70 - 99 mg/dL    Comment: Glucose reference range applies only to samples taken after fasting for at least 8 hours.   Comment 1 Notify RN    Comment 2 Document in Chart   Basic metabolic panel     Status: Abnormal   Collection Time: 07/30/23 12:19 PM  Result Value Ref Range   Sodium 131 (L) 135 - 145 mmol/L   Potassium 4.3 3.5 - 5.1 mmol/L   Chloride 103 98 - 111 mmol/L   CO2 18 (L) 22 - 32 mmol/L   Glucose, Bld 433 (H) 70 - 99 mg/dL    Comment: Glucose reference range applies only to samples taken after fasting for at least 8 hours.   BUN 13 8 - 23 mg/dL   Creatinine, Ser 4.17 0.44 - 1.00 mg/dL   Calcium 8.5 (L) 8.9 - 10.3 mg/dL   GFR, Estimated >40 >81 mL/min    Comment: (NOTE) Calculated using the CKD-EPI Creatinine Equation (2021)    Anion gap 10 5 - 15    Comment: Performed at [facility], [address], [place], [place] [address]  CBC     Status: Abnormal   Collection Time: 07/30/23 12:19 PM  Result Value Ref Range   WBC 19.7 (H)  4.0 - 10.5 K/uL   RBC 5.42 (H) 3.87 - 5.11 MIL/uL   Hemoglobin 10.3 (L) 12.0 - 15.0 g/dL   HCT 16.1 09.6 - 04.5 %   MCV 66.6 (L) 80.0 - 100.0 fL   MCH 19.0 (L) 26.0 - 34.0 pg   MCHC 28.5 (L) 30.0 - 36.0 g/dL   RDW 40.9 (H) 81.1 - 91.4 %   Platelets 188 150 - 400 K/uL    Comment: REPEATED TO VERIFY   nRBC 0.0 0.0 - 0.2 %    Comment: Performed at [facility], [address] [place], [place] [address]  Urinalysis, Routine w reflex microscopic -Urine, Clean Catch     Status: Abnormal   Collection Time: 07/30/23 12:19 PM  Result Value Ref Range   Color, Urine YELLOW YELLOW   APPearance HAZY (A) CLEAR   Specific Gravity, Urine 1.031 (H) 1.005 - 1.030   pH 5.0 5.0 - 8.0   Glucose, UA >=500 (A) NEGATIVE mg/dL   Hgb urine dipstick  NEGATIVE NEGATIVE   Bilirubin Urine NEGATIVE NEGATIVE   Ketones, ur NEGATIVE NEGATIVE mg/dL   Protein, ur 621 (A) NEGATIVE mg/dL   Nitrite NEGATIVE NEGATIVE   Leukocytes,Ua NEGATIVE NEGATIVE   RBC / HPF 0-5 0 - 5 RBC/hpf   WBC, UA 0-5 0 - 5 WBC/hpf   Bacteria, UA RARE (A) NONE SEEN   Squamous Epithelial / HPF 0-5 0 - 5 /HPF   Mucus PRESENT    Hyaline Casts, UA PRESENT     Comment: Performed at [facility], [address], [place], [place] [address]  Beta-hydroxybutyric acid     Status: None   Collection Time: 07/30/23 12:19 PM  Result Value Ref Range   Beta-Hydroxybutyric Acid 0.21 0.05 - 0.27 mmol/L    Comment: Performed at [facility], [address] [place], [place] [address]  Troponin I (High Sensitivity)     Status: Abnormal   Collection Time: 07/30/23 12:19 PM  Result Value Ref Range   Troponin I (High Sensitivity) 89 (H) <18 ng/L    Comment: (NOTE) Elevated high sensitivity troponin I (hsTnI) values and significant  changes across serial measurements may suggest ACS but many other  chronic and acute conditions are known to elevate hsTnI results.  Refer to the "Links" section for chest pain algorithms and additional  guidance. Performed at [facility], [address], [place], [place] [address]   Magnesium     Status: None   Collection Time: 07/30/23 12:19 PM  Result Value Ref Range   Magnesium 1.8 1.7 - 2.4 mg/dL    Comment: Performed at [facility], [address] [place], [place] [address]  Hepatic function panel     Status: Abnormal   Collection Time: 07/30/23 12:19 PM  Result Value Ref Range   Total Protein 6.0 (L) 6.5 - 8.1 g/dL   Albumin 3.2 (L) 3.5 - 5.0 g/dL   AST 29 15 - 41 U/L   ALT 24 0 - 44 U/L   Alkaline Phosphatase 59 38 - 126 U/L   Total Bilirubin 0.7 <1.2 mg/dL   Bilirubin, Direct <4.4 0.0 - 0.2 mg/dL   Indirect Bilirubin NOT CALCULATED 0.3 - 0.9 mg/dL    Comment: Performed at [facility], [address]  [place], [place] [address]  CBG monitoring, ED     Status: Abnormal   Collection Time: 07/30/23 12:37 PM  Result Value Ref Range   Glucose-Capillary  356 (H) 70 - 99 mg/dL    Comment: Glucose reference range applies only to samples taken after fasting for at least 8 hours.  CK     Status: None   Collection Time: 07/30/23  2:44 PM  Result Value Ref Range   Total CK 69 38 - 234 U/L    Comment: Performed at [facility], [address], [place], [place] [address]  Osmolality     Status: Abnormal   Collection Time: 07/30/23  2:44 PM  Result Value Ref Range   Osmolality 312 (H) 275 - 295 mOsm/kg    Comment: REPEATED TO VERIFY Performed at [facility], [address], [place], [place] [address]   Troponin I (High Sensitivity)     Status: Abnormal   Collection Time: 07/30/23  2:44 PM  Result Value Ref Range   Troponin I (High Sensitivity) 86 (H) <18 ng/L    Comment: (NOTE) Elevated high sensitivity troponin I (hsTnI) values and significant  changes across serial measurements may suggest ACS but many other  chronic and acute conditions are known to elevate hsTnI results.  Refer to the "Links" section for chest pain algorithms and additional  guidance. Performed at [facility], [address], [place], [place] [address]   Lactic acid, plasma     Status: Abnormal   Collection Time: 07/30/23  2:44 PM  Result Value Ref Range   Lactic Acid, Venous 3.8 (HH) 0.5 - 1.9 mmol/L    Comment: CRITICAL RESULT CALLED TO, READ BACK BY AND VERIFIED WITH Anelise Staron RN @1543  ON 07/30/23 BY MAB Performed at [facility], [address], [place], [place] [address]   Hemoglobin A1c     Status: Abnormal   Collection Time: 07/30/23  2:46 PM  Result Value Ref Range   Hgb A1c MFr Bld 11.4 (H) 4.8 - 5.6 %    Comment: (NOTE) Pre diabetes:          5.7%-6.4%  Diabetes:              >6.4%  Glycemic control for   <7.0% adults with diabetes    Mean Plasma Glucose 280.48 mg/dL     Comment: Performed at [facility], [address] [place], [place] [address]  I-Stat venous blood gas, [facility], [facility], [facility])     Status: Abnormal   Collection Time: 07/30/23  3:08 PM  Result Value Ref Range   pH, Ven 7.367 7.25 - 7.43   pCO2, Ven 35.3 (L) 44 - 60 mmHg   pO2, Ven 60 (H) 32 - 45 mmHg   Bicarbonate 20.3 20.0 - 28.0 mmol/L   TCO2 21 (L) 22 - 32 mmol/L   O2 Saturation 90 %   Acid-base deficit 5.0 (H) 0.0 - 2.0 mmol/L   Sodium 137 135 - 145 mmol/L   Potassium 4.5 3.5 - 5.1 mmol/L   Calcium, Ion 1.04 (L) 1.15 - 1.40 mmol/L   HCT 29.0 (L) 36.0 - 46.0 %   Hemoglobin 9.9 (L) 12.0 - 15.0 g/dL   Sample type VENOUS    CT CHEST ABDOMEN PELVIS W CONTRAST Result Date: 07/30/2023 CLINICAL DATA:  Trauma EXAM: CT CHEST, ABDOMEN, AND PELVIS WITH CONTRAST TECHNIQUE: Multidetector CT imaging of the chest, abdomen and pelvis was performed following the standard protocol during bolus administration of intravenous contrast. RADIATION DOSE REDUCTION: This exam was performed according to the departmental dose-optimization program which includes automated exposure control, adjustment of the mA  and/or kV according to patient size and/or use of iterative reconstruction technique. CONTRAST:  75mL OMNIPAQUE IOHEXOL 350 MG/ML SOLN COMPARISON:  None Available. FINDINGS: CT CHEST FINDINGS Cardiovascular: Severe aortic atherosclerosis. Normal heart size. No pericardial effusion. Mediastinum/Nodes: No enlarged mediastinal, hilar, or axillary lymph nodes. Thyroid gland, trachea, and esophagus demonstrate no significant findings. Lungs/Pleura: Mild centrilobular emphysema. Mild pulmonary fibrosis in a pattern with apical to basal gradient featuring irregular peripheral interstitial opacity and septal thickening with minimal traction bronchiectasis and minimal areas of subpleural bronchiolectasis of the lung bases. Small irregular nodule of the posterior left apex measuring 0.6 cm (series 6, image 69). No pleural  effusion or pneumothorax. Musculoskeletal: No chest wall abnormality. No acute osseous findings. CT ABDOMEN PELVIS FINDINGS Hepatobiliary: No solid liver abnormality is seen. Coarse, nodular cirrhotic morphology of the liver. No gallstones, gallbladder wall thickening, or biliary dilatation. Pancreas: Unremarkable. No pancreatic ductal dilatation or surrounding inflammatory changes. Spleen: Normal in size without significant abnormality. Adrenals/Urinary Tract: Adrenal glands are unremarkable. Kidneys are normal, without renal calculi, solid lesion, or hydronephrosis. Bladder is unremarkable. Stomach/Bowel: Stomach is within normal limits. Appendix appears normal. No evidence of bowel wall thickening, distention, or inflammatory changes. Sigmoid diverticulosis. Vascular/Lymphatic: Severe aortic atherosclerosis. 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 No enlarged abdominal or pelvic lymph nodes. Reproductive: No mass or other abnormality. Other: No abdominal wall hernia or abnormality. No ascites. Musculoskeletal: No acute osseous findings. IMPRESSION: 1. No CT evidence of acute traumatic injury to the chest, abdomen, or pelvis. 2. Mild pulmonary fibrosis in a pattern with apical to basal gradient featuring irregular peripheral interstitial opacity and septal thickening with minimal traction bronchiectasis and minimal areas of subpleural bronchiolectasis of the lung bases. 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 3. Small irregular nodule of the posterior left apex measuring 0.6 cm. Non-contrast chest CT at 6-12 months is recommended. If the nodule is stable at time of repeat CT, then future CT at 18-24 months (from today's scan) is considered optional for low-risk patients, but is recommended for high-risk patients. This recommendation follows the consensus statement: Guidelines for Management of Incidental Pulmonary Nodules Detected on CT Images: From the  Fleischner Society 2017; Radiology 2017; 284:228-243. 4. Cirrhosis. 5. 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 6. Sigmoid diverticulosis without evidence of acute diverticulitis. Aortic Atherosclerosis (ICD10-I70.0) and Emphysema (ICD10-J43.9). Electronically Signed   By: Jearld Lesch M.D.   On: 07/30/2023 14:18   CT HEAD WO CONTRAST Result Date: 07/30/2023 CLINICAL DATA:  Trauma EXAM: CT HEAD WITHOUT CONTRAST CT CERVICAL SPINE WITHOUT CONTRAST TECHNIQUE: Multidetector CT imaging of the head and cervical spine was performed following the standard protocol without intravenous contrast. Multiplanar CT image reconstructions of the cervical spine were also generated. RADIATION DOSE REDUCTION: This exam was performed according to the departmental dose-optimization program which includes automated exposure control, adjustment of the mA and/or kV according to patient size and/or use of iterative reconstruction technique. COMPARISON:  None Available. FINDINGS: CT HEAD FINDINGS Brain: Cortical based hypodensity of the right parieto-occipital junction measuring 2.4 x 2.2 cm (series 11, image 25, series 9, image 24). No evidence of hemorrhage, hydrocephalus, extra-axial collection or mass effect. Vascular: No hyperdense vessel or unexpected calcification. Skull: Normal. Negative for fracture or focal lesion. Sinuses/Orbits: No acute finding. Other: Soft tissue contusion of the left scalp vertex (series 11, image 7) CT CERVICAL SPINE FINDINGS Alignment: Degenerative straightening of the normal cervical lordosis. Skull base and  vertebrae: No acute fracture. No primary bone lesion or focal pathologic process. Soft tissues and spinal canal: No prevertebral fluid or swelling. No visible canal hematoma. Disc levels: Moderate disc space height loss and osteophytosis from C5-C7 Upper chest: Negative. Other: None. IMPRESSION: 1. Cortical based hypodensity of the right parieto-occipital junction measuring 2.4 x 2.2  cm. This is concerning for acute or subacute infarct or alternately underlying mass. Recommend MRI for further evaluation. 2. No evidence of hemorrhage or mass effect. 3. Soft tissue contusion of the left scalp vertex. 4. No fracture or static subluxation of the cervical spine. 5. Moderate disc degenerative disease from C5-C7. Electronically Signed   By: Jearld Lesch M.D.   On: 07/30/2023 14:13   CT CERVICAL SPINE WO CONTRAST Result Date: 07/30/2023 CLINICAL DATA:  Trauma EXAM: CT HEAD WITHOUT CONTRAST CT CERVICAL SPINE WITHOUT CONTRAST TECHNIQUE: Multidetector CT imaging of the head and cervical spine was performed following the standard protocol without intravenous contrast. Multiplanar CT image reconstructions of the cervical spine were also generated. RADIATION DOSE REDUCTION: This exam was performed according to the departmental dose-optimization program which includes automated exposure control, adjustment of the mA and/or kV according to patient size and/or use of iterative reconstruction technique. COMPARISON:  None Available. FINDINGS: CT HEAD FINDINGS Brain: Cortical based hypodensity of the right parieto-occipital junction measuring 2.4 x 2.2 cm (series 11, image 25, series 9, image 24). No evidence of hemorrhage, hydrocephalus, extra-axial collection or mass effect. Vascular: No hyperdense vessel or unexpected calcification. Skull: Normal. Negative for fracture or focal lesion. Sinuses/Orbits: No acute finding. Other: Soft tissue contusion of the left scalp vertex (series 11, image 7) CT CERVICAL SPINE FINDINGS Alignment: Degenerative straightening of the normal cervical lordosis. Skull base and vertebrae: No acute fracture. No primary bone lesion or focal pathologic process. Soft tissues and spinal canal: No prevertebral fluid or swelling. No visible canal hematoma. Disc levels: Moderate disc space height loss and osteophytosis from C5-C7 Upper chest: Negative. Other: None. IMPRESSION: 1. Cortical  based hypodensity of the right parieto-occipital junction measuring 2.4 x 2.2 cm. This is concerning for acute or subacute infarct or alternately underlying mass. Recommend MRI for further evaluation. 2. No evidence of hemorrhage or mass effect. 3. Soft tissue contusion of the left scalp vertex. 4. No fracture or static subluxation of the cervical spine. 5. Moderate disc degenerative disease from C5-C7. Electronically Signed   By: Jearld Lesch M.D.   On: 07/30/2023 14:13    Pending Labs Unresulted Labs (From admission, onward)     Start     Ordered   07/31/23 0500  Basic metabolic panel  Tomorrow morning,   R        07/30/23 1621   07/31/23 0500  CBC  Tomorrow morning,   R        07/30/23 1621   07/30/23 1620  HIV Antibody (routine testing w rflx)  (HIV Antibody (Routine testing w reflex) panel)  Once,   R        07/30/23 1621   07/30/23 1349  Lactic acid, plasma  (Lactic Acid)  Now then every 2 hours,   R      07/30/23 1348   07/30/23 1348  Blood culture (routine x 2)  BLOOD CULTURE X 2,   R      07/30/23 1348            Vitals/Pain Today's Vitals   07/30/23 1132 07/30/23 1315 07/30/23 1523 07/30/23 1655  BP:  (!) 142/65  Marland Kitchen)  145/69  Pulse:  (!) 105  (!) 108  Resp:  16    Temp:   98 F (36.7 C) 98.1 F (36.7 C)  TempSrc:   Axillary Oral  SpO2:  98%  100%  Weight: 81.6 kg     Height: 5\' 6"  (1.676 m)     PainSc:        Isolation Precautions No active isolations  Medications Medications  lactated ringers infusion ( Intravenous New Bag/Given 07/30/23 1527)  metroNIDAZOLE (FLAGYL) IVPB 500 mg (500 mg Intravenous New Bag/Given 07/30/23 1656)  vancomycin (VANCOREADY) IVPB 1750 mg/350 mL (1,750 mg Intravenous New Bag/Given 07/30/23 1517)  lactated ringers bolus 2,000 mL (0 mLs Intravenous Stopped 07/30/23 1515)  metoCLOPramide (REGLAN) injection 10 mg (10 mg Intravenous Given 07/30/23 1420)  diphenhydrAMINE (BENADRYL) injection 25 mg (25 mg Intravenous Given 07/30/23 1419)   iohexol (OMNIPAQUE) 350 MG/ML injection 75 mL (75 mLs Intravenous Contrast Given 07/30/23 1347)  ceFEPIme (MAXIPIME) 2 g in sodium chloride 0.9 % 100 mL IVPB (0 g Intravenous Stopped 07/30/23 1601)  lactated ringers bolus 1,000 mL (1,000 mLs Intravenous New Bag/Given 07/30/23 1656)    Mobility walks with person assist     Focused Assessments Neuro Assessment Handoff:  Swallow screen pass? Yes  Cardiac Rhythm: Sinus tachycardia       Neuro Assessment: Exceptions to WDL (reports fatigue) Neuro Checks:      Has TPA been given? No If patient is a Neuro Trauma and patient is going to OR before floor call report to 4N Charge nurse: [phone] or [phone]   R Recommendations: See Admitting Provider Note  Report given to:   Additional Notes:

## 2023-07-30 NOTE — Inpatient Diabetes Management (Signed)
Inpatient Diabetes Program Recommendations  AACE/ADA: New Consensus Statement on Inpatient Glycemic Control (2015)  Target Ranges:  Prepandial:   less than 140 mg/dL      Peak postprandial:   less than 180 mg/dL (1-2 hours)      Critically ill patients:  140 - 180 mg/dL   Lab Results  Component Value Date   GLUCAP 356 (H) 07/30/2023    Review of Glycemic Control  Latest Reference Range & Units 07/30/23 11:26 07/30/23 12:37  Glucose-Capillary 70 - 99 mg/dL 119 (H) 147 (H)  (H): Data is abnormally high  Latest Reference Range & Units 07/30/23 12:19  CO2 22 - 32 mmol/L 18 (L)  (L): Data is abnormally low  Latest Reference Range & Units 07/30/23 12:19  Beta-Hydroxybutyric Acid 0.05 - 0.27 mmol/L 0.21   Diabetes history: New DM Outpatient Diabetes medications: None Current orders for Inpatient glycemic control: IVF  Met with family and patient at bedside.  Patient has had increased thirst and urination, N/V and unintentional weight loss.  She has been feeling unwell over the last few days.  This morning she was shaky and weak and passed out.  When EMS checked her glucose it was >500 mg/dL.    Spoke with them about new diagnosis. Discussed basic pathophysiology of DM Type 2, basic home care, basic diabetes diet nutrition principles, importance of checking CBGs and maintaining good CBG control to prevent long-term and short-term complications. Reviewed signs and symptoms of hyperglycemia and hypoglycemia and how to treat hypoglycemia at home. Also reviewed blood sugar goals at home.  RNs to provide ongoing basic DM education at bedside with this patient. Ordered the Living Well with Diabetes booklet.  Please deliver this to patient if she discharges from ED.    She does not have a PCP.  She states she is planning on establishing with [facility].  If she discharges, encouraged her to make an appointment for in 1-2 weeks.  She will need to start checking her CBG's at home fasting, mid afternoon  and before bed time.  She should bring this glucometer with her to her PCP visit.    Educated on The Plate Method, CHO's, portion control, CBGs at home fasting and mid afternoon, F/U with PCP every 3 months, bring meter to PCP office, long and short term complications of uncontrolled BG, importance of exercise, and eliminating caloric beverages.    Briefly introduced the insulin pen and discussed long and rapid insulins as our team will not be on campus 12/24 & 12/25.    Educated on Metformin; take with a meal, how it works and side effects.    Will continue to follow while inpatient.  Thank you, Dulce Sellar, MSN, CDCES Diabetes Coordinator Inpatient Diabetes Program [phone] (team pager from 8a-5p)

## 2023-07-30 NOTE — Progress Notes (Incomplete)
Pharmacy Antibiotic Note  Maria Rose is a 67 y.o. female admitted on 07/30/2023 with syncopal episode and concerns for sepsis.  Pharmacy has been consulted for cefepime and vancomycin dosing.  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, but cannot rule out septic etiologies yet -WBC 19.7, sCr 0.74, lactate 2.7, afebrile -Received loading dose of vanc, cefepime,and flagyl  Plan: -cefepime 2g IV every 8 hours -Vancomycin 1750mg  IV x1 -Vancomycin -Monitor renal function -Follow up signs of clinical improvement, LOT, de-escalation of antibiotics   Height: 5\' 6"  (167.6 cm) Weight: 81.6 kg (180 lb) IBW/kg (Calculated) : 59.3  Temp (24hrs), Avg:98.2 F (36.8 C), Min:98 F (36.7 C), Max:99 F (37.2 C)  Recent Labs  Lab 07/30/23 1219 07/30/23 1444 07/30/23 2043  WBC 19.7*  --   --   CREATININE 0.74  --   --   LATICACIDVEN  --  3.8* 2.7*    Estimated Creatinine Clearance: 72.5 mL/min (by C-G formula based on SCr of 0.74 mg/dL).    No Known Allergies  Antimicrobials this admission: Cefepime 12/23 >>  Vancomycin 12/23 >> ***  Dose adjustments this admission: ***  Microbiology results: *** BCx: *** *** UCx: ***  *** Sputum: ***  *** MRSA PCR: ***  Thank you for allowing pharmacy to be a part of this patient's care.  Fayrene Fearing [facility] 07/30/2023 11:56 PM

## 2023-07-30 NOTE — Hospital Course (Addendum)
#  Concern for sepsis  Initially presented with a lactic acidosis and leukocytosis.  She was started on Flagyl and vancomycin.  She remained afebrile and her blood cultures were negative.   #Syncopal Episode #Acute/subacute stroke vs underlying brain mass CT head notable for hypodensity of the right parieto-occipital junction measuring 2.4 x 2.2 cm concerning for acute or subacute infarct or alternately underlying mass. 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 TTE WNL. TEE recommended to rule out endocarditis and evaluate embolic source. TEE deferred until stabilization of patient due to declining hemoglobin and esophageal varices.   #Gastric and Splenic Varices #Anemia  #Cirrhosis  Found to have hepatic cirrhosis likely secondary to [facility].  Her hemoglobin dropped from 10.3-5.6 after melena with hematemesis. She also became hypotensive. Lactic acid climbed from 2.7 to 8.7.Due to ongoing bleed with new hematemesis, decision made to transfer to ICU and PCCM consulted for assistance. There was concern for variceal bleed so she was started on octreotide, PPI, and ceftriaxone.  She received 2 units of blood.  Endoscopy demonstrated numerous bleeding varices, so IR was asked to evaluate for TIPS. After stabilization in ICU, she was transferred back to IMTS.    #Hyperglycemia #Diabetes A1c noted to have been 11.4.

## 2023-07-30 NOTE — Progress Notes (Signed)
Pharmacy Antibiotic Note  Maria Rose is a 67 y.o. female admitted on 07/30/2023 with syncopal episode and concerns for sepsis.  Pharmacy has been consulted for cefepime and vancomycin dosing.  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, but cannot rule out septic etiologies yet -WBC 19.7, sCr 0.74, lactate 2.7, afebrile -Received loading dose of vanc, cefepime,and flagyl  Plan: -Cefepime 2g IV every 8 hours -Vancomycin 1750mg  IV x1 -Vancomycin 1000mg  IV every 12 hours (Vanc trough 15-20) -Monitor renal function -Follow up signs of clinical improvement, LOT, de-escalation of antibiotics   Height: 5\' 6"  (167.6 cm) Weight: 81.6 kg (180 lb) IBW/kg (Calculated) : 59.3  Temp (24hrs), Avg:98.2 F (36.8 C), Min:98 F (36.7 C), Max:99 F (37.2 C)  Recent Labs  Lab 07/30/23 1219 07/30/23 1444 07/30/23 2043  WBC 19.7*  --   --   CREATININE 0.74  --   --   LATICACIDVEN  --  3.8* 2.7*    Estimated Creatinine Clearance: 72.5 mL/min (by C-G formula based on SCr of 0.74 mg/dL).    No Known Allergies  Antimicrobials this admission: Cefepime 12/23 >>  Vancomycin 12/23 >>   Microbiology results: 12/23 BCx:   Thank you for allowing pharmacy to be a part of this patient's care.  Arabella Merles, PharmD. Clinical Pharmacist 07/31/2023 12:05 AM

## 2023-07-30 NOTE — ED Provider Notes (Addendum)
[place] EMERGENCY DEPARTMENT AT [facility] Provider Note   CSN: [id number] Arrival date & time: 07/30/23  1115     History  Chief Complaint  Patient presents with   Hyperglycemia   Near Syncope    Maria Rose is a 67 y.o. female.   Hyperglycemia Associated symptoms: fatigue, increased thirst, nausea, polyuria, vomiting and weakness (Generalized)   Near Syncope Associated symptoms include headaches.  Patient presents for syncope.  For the past several days, she has had polyuria and polydipsia.  She has had a bifrontal headache with pain behind her eyes.  She has had associated nausea and has been having intermittent vomiting.  She denies any recent diarrhea.  Earlier today, she had a syncopal episode.  This was unwitnessed.  Her husband was nearby and did hear the fall.  She was awake when he got to the room but she seemed disoriented.  EMS was called to her home.  EMS glucometer read "high".  She does not have a known diagnosis of diabetes.     Home Medications Prior to Admission medications   Medication Sig Start Date End Date Taking? Authorizing Provider  cephALEXin (KEFLEX) 500 MG capsule Take 1 capsule (500 mg total) by mouth 4 (four) times daily. 05/16/23   Renne Crigler, PA-C  clotrimazole (LOTRIMIN) 1 % cream Apply to affected area 2 times daily 05/16/23   Renne Crigler, PA-C  fluconazole (DIFLUCAN) 150 MG tablet Take 1 tablet (150 mg total) by mouth daily. 05/16/23   Renne Crigler, PA-C  oxyCODONE-acetaminophen (PERCOCET) 5-325 MG tablet Take 1 tablet by mouth every 6 (six) hours as needed. 07/25/20   Little, Ambrose Finland, MD      Allergies    Patient has no known allergies.    Review of Systems   Review of Systems  Constitutional:  Positive for fatigue.  Cardiovascular:  Positive for near-syncope.  Gastrointestinal:  Positive for nausea and vomiting.  Endocrine: Positive for polydipsia and polyuria.  Neurological:  Positive for weakness  (Generalized) and headaches.  All other systems reviewed and are negative.   Physical Exam Updated Vital Signs BP (!) 142/65   Pulse (!) 105   Temp 98 F (36.7 C) (Axillary)   Resp 16   Ht 5\' 6"  (1.676 m)   Wt 81.6 kg   SpO2 98%   BMI 29.05 kg/m  Physical Exam Vitals and nursing note reviewed.  Constitutional:      General: She is not in acute distress.    Appearance: Normal appearance. She is well-developed. She is not ill-appearing, toxic-appearing or diaphoretic.  HENT:     Head: Normocephalic and atraumatic.     Right Ear: External ear normal.     Left Ear: External ear normal.     Nose: Nose normal.     Mouth/Throat:     Mouth: Mucous membranes are moist.  Eyes:     Extraocular Movements: Extraocular movements intact.     Conjunctiva/sclera: Conjunctivae normal.  Cardiovascular:     Rate and Rhythm: Normal rate and regular rhythm.  Pulmonary:     Effort: Pulmonary effort is normal. No respiratory distress.  Abdominal:     General: There is no distension.     Palpations: Abdomen is soft.  Musculoskeletal:        General: No swelling. Normal range of motion.     Cervical back: Normal range of motion and neck supple.  Skin:    General: Skin is warm and dry.  Coloration: Skin is not jaundiced or pale.  Neurological:     General: No focal deficit present.     Mental Status: She is alert and oriented to person, place, and time.  Psychiatric:        Mood and Affect: Mood normal.        Behavior: Behavior normal.     ED Results / Procedures / Treatments   Labs (all labs ordered are listed, but only abnormal results are displayed) Labs Reviewed  BASIC METABOLIC PANEL - Abnormal; Notable for the following components:      Result Value   Sodium 131 (*)    CO2 18 (*)    Glucose, Bld 433 (*)    Calcium 8.5 (*)    All other components within normal limits  CBC - Abnormal; Notable for the following components:   WBC 19.7 (*)    RBC 5.42 (*)    Hemoglobin  10.3 (*)    MCV 66.6 (*)    MCH 19.0 (*)    MCHC 28.5 (*)    RDW 22.3 (*)    All other components within normal limits  URINALYSIS, ROUTINE W REFLEX MICROSCOPIC - Abnormal; Notable for the following components:   APPearance HAZY (*)    Specific Gravity, Urine 1.031 (*)    Glucose, UA >=500 (*)    Protein, ur 100 (*)    Bacteria, UA RARE (*)    All other components within normal limits  HEPATIC FUNCTION PANEL - Abnormal; Notable for the following components:   Total Protein 6.0 (*)    Albumin 3.2 (*)    All other components within normal limits  HEMOGLOBIN A1C - Abnormal; Notable for the following components:   Hgb A1c MFr Bld 11.4 (*)    All other components within normal limits  OSMOLALITY - Abnormal; Notable for the following components:   Osmolality 312 (*)    All other components within normal limits  LACTIC ACID, PLASMA - Abnormal; Notable for the following components:   Lactic Acid, Venous 3.8 (*)    All other components within normal limits  CBG MONITORING, ED - Abnormal; Notable for the following components:   Glucose-Capillary 405 (*)    All other components within normal limits  CBG MONITORING, ED - Abnormal; Notable for the following components:   Glucose-Capillary 356 (*)    All other components within normal limits  I-STAT VENOUS BLOOD GAS, ED - Abnormal; Notable for the following components:   pCO2, Ven 35.3 (*)    pO2, Ven 60 (*)    TCO2 21 (*)    Acid-base deficit 5.0 (*)    Calcium, Ion 1.04 (*)    HCT 29.0 (*)    Hemoglobin 9.9 (*)    All other components within normal limits  TROPONIN I (HIGH SENSITIVITY) - Abnormal; Notable for the following components:   Troponin I (High Sensitivity) 89 (*)    All other components within normal limits  TROPONIN I (HIGH SENSITIVITY) - Abnormal; Notable for the following components:   Troponin I (High Sensitivity) 86 (*)    All other components within normal limits  CULTURE, BLOOD (ROUTINE X 2)  CULTURE, BLOOD  (ROUTINE X 2)  BETA-HYDROXYBUTYRIC ACID  MAGNESIUM  CK  LACTIC ACID, PLASMA    EKG EKG Interpretation Date/Time:  Monday July 30 2023 11:30:58 EST Ventricular Rate:  115 PR Interval:  122 QRS Duration:  88 QT Interval:  348 QTC Calculation: 481 R Axis:   4  Text Interpretation:  Sinus tachycardia Cannot rule out Anterior infarct , age undetermined Marked ST abnormality, possible inferior subendocardial injury Confirmed by Gloris Manchester [phone]) on 07/30/2023 11:51:03 AM  Radiology CT CHEST ABDOMEN PELVIS W CONTRAST Result Date: 07/30/2023 CLINICAL DATA:  Trauma EXAM: CT CHEST, ABDOMEN, AND PELVIS WITH CONTRAST TECHNIQUE: Multidetector CT imaging of the chest, abdomen and pelvis was performed following the standard protocol during bolus administration of intravenous contrast. RADIATION DOSE REDUCTION: This exam was performed according to the departmental dose-optimization program which includes automated exposure control, adjustment of the mA and/or kV according to patient size and/or use of iterative reconstruction technique. CONTRAST:  75mL OMNIPAQUE IOHEXOL 350 MG/ML SOLN COMPARISON:  None Available. FINDINGS: CT CHEST FINDINGS Cardiovascular: Severe aortic atherosclerosis. Normal heart size. No pericardial effusion. Mediastinum/Nodes: No enlarged mediastinal, hilar, or axillary lymph nodes. Thyroid gland, trachea, and esophagus demonstrate no significant findings. Lungs/Pleura: Mild centrilobular emphysema. Mild pulmonary fibrosis in a pattern with apical to basal gradient featuring irregular peripheral interstitial opacity and septal thickening with minimal traction bronchiectasis and minimal areas of subpleural bronchiolectasis of the lung bases. Small irregular nodule of the posterior left apex measuring 0.6 cm (series 6, image 69). No pleural effusion or pneumothorax. Musculoskeletal: No chest wall abnormality. No acute osseous findings. CT ABDOMEN PELVIS FINDINGS Hepatobiliary: No solid  liver abnormality is seen. Coarse, nodular cirrhotic morphology of the liver. No gallstones, gallbladder wall thickening, or biliary dilatation. Pancreas: Unremarkable. No pancreatic ductal dilatation or surrounding inflammatory changes. Spleen: Normal in size without significant abnormality. Adrenals/Urinary Tract: Adrenal glands are unremarkable. Kidneys are normal, without renal calculi, solid lesion, or hydronephrosis. Bladder is unremarkable. Stomach/Bowel: Stomach is within normal limits. Appendix appears normal. No evidence of bowel wall thickening, distention, or inflammatory changes. Sigmoid diverticulosis. Vascular/Lymphatic: Severe aortic atherosclerosis. 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 No enlarged abdominal or pelvic lymph nodes. Reproductive: No mass or other abnormality. Other: No abdominal wall hernia or abnormality. No ascites. Musculoskeletal: No acute osseous findings. IMPRESSION: 1. No CT evidence of acute traumatic injury to the chest, abdomen, or pelvis. 2. Mild pulmonary fibrosis in a pattern with apical to basal gradient featuring irregular peripheral interstitial opacity and septal thickening with minimal traction bronchiectasis and minimal areas of subpleural bronchiolectasis of the lung bases. 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 3. Small irregular nodule of the posterior left apex measuring 0.6 cm. Non-contrast chest CT at 6-12 months is recommended. If the nodule is stable at time of repeat CT, then future CT at 18-24 months (from today's scan) is considered optional for low-risk patients, but is recommended for high-risk patients. This recommendation follows the consensus statement: Guidelines for Management of Incidental Pulmonary Nodules Detected on CT Images: From the Fleischner Society 2017; Radiology 2017; 284:228-243. 4. Cirrhosis. 5. 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 6. Sigmoid  diverticulosis without evidence of acute diverticulitis. Aortic Atherosclerosis (ICD10-I70.0) and Emphysema (ICD10-J43.9). Electronically Signed   By: Jearld Lesch M.D.   On: 07/30/2023 14:18   CT HEAD WO CONTRAST Result Date: 07/30/2023 CLINICAL DATA:  Trauma EXAM: CT HEAD WITHOUT CONTRAST CT CERVICAL SPINE WITHOUT CONTRAST TECHNIQUE: Multidetector CT imaging of the head and cervical spine was performed following the standard protocol without intravenous contrast. Multiplanar CT image reconstructions of the cervical spine were also generated. RADIATION DOSE REDUCTION: This exam was performed according to the departmental dose-optimization program which includes automated exposure control, adjustment of the mA and/or kV according to patient size  and/or use of iterative reconstruction technique. COMPARISON:  None Available. FINDINGS: CT HEAD FINDINGS Brain: Cortical based hypodensity of the right parieto-occipital junction measuring 2.4 x 2.2 cm (series 11, image 25, series 9, image 24). No evidence of hemorrhage, hydrocephalus, extra-axial collection or mass effect. Vascular: No hyperdense vessel or unexpected calcification. Skull: Normal. Negative for fracture or focal lesion. Sinuses/Orbits: No acute finding. Other: Soft tissue contusion of the left scalp vertex (series 11, image 7) CT CERVICAL SPINE FINDINGS Alignment: Degenerative straightening of the normal cervical lordosis. Skull base and vertebrae: No acute fracture. No primary bone lesion or focal pathologic process. Soft tissues and spinal canal: No prevertebral fluid or swelling. No visible canal hematoma. Disc levels: Moderate disc space height loss and osteophytosis from C5-C7 Upper chest: Negative. Other: None. IMPRESSION: 1. Cortical based hypodensity of the right parieto-occipital junction measuring 2.4 x 2.2 cm. This is concerning for acute or subacute infarct or alternately underlying mass. Recommend MRI for further evaluation. 2. No evidence  of hemorrhage or mass effect. 3. Soft tissue contusion of the left scalp vertex. 4. No fracture or static subluxation of the cervical spine. 5. Moderate disc degenerative disease from C5-C7. Electronically Signed   By: Jearld Lesch M.D.   On: 07/30/2023 14:13   CT CERVICAL SPINE WO CONTRAST Result Date: 07/30/2023 CLINICAL DATA:  Trauma EXAM: CT HEAD WITHOUT CONTRAST CT CERVICAL SPINE WITHOUT CONTRAST TECHNIQUE: Multidetector CT imaging of the head and cervical spine was performed following the standard protocol without intravenous contrast. Multiplanar CT image reconstructions of the cervical spine were also generated. RADIATION DOSE REDUCTION: This exam was performed according to the departmental dose-optimization program which includes automated exposure control, adjustment of the mA and/or kV according to patient size and/or use of iterative reconstruction technique. COMPARISON:  None Available. FINDINGS: CT HEAD FINDINGS Brain: Cortical based hypodensity of the right parieto-occipital junction measuring 2.4 x 2.2 cm (series 11, image 25, series 9, image 24). No evidence of hemorrhage, hydrocephalus, extra-axial collection or mass effect. Vascular: No hyperdense vessel or unexpected calcification. Skull: Normal. Negative for fracture or focal lesion. Sinuses/Orbits: No acute finding. Other: Soft tissue contusion of the left scalp vertex (series 11, image 7) CT CERVICAL SPINE FINDINGS Alignment: Degenerative straightening of the normal cervical lordosis. Skull base and vertebrae: No acute fracture. No primary bone lesion or focal pathologic process. Soft tissues and spinal canal: No prevertebral fluid or swelling. No visible canal hematoma. Disc levels: Moderate disc space height loss and osteophytosis from C5-C7 Upper chest: Negative. Other: None. IMPRESSION: 1. Cortical based hypodensity of the right parieto-occipital junction measuring 2.4 x 2.2 cm. This is concerning for acute or subacute infarct or  alternately underlying mass. Recommend MRI for further evaluation. 2. No evidence of hemorrhage or mass effect. 3. Soft tissue contusion of the left scalp vertex. 4. No fracture or static subluxation of the cervical spine. 5. Moderate disc degenerative disease from C5-C7. Electronically Signed   By: Jearld Lesch M.D.   On: 07/30/2023 14:13    Procedures Procedures    Medications Ordered in ED Medications  lactated ringers infusion ( Intravenous New Bag/Given 07/30/23 1527)  metroNIDAZOLE (FLAGYL) IVPB 500 mg (has no administration in time range)  vancomycin (VANCOREADY) IVPB 1750 mg/350 mL (1,750 mg Intravenous New Bag/Given 07/30/23 1517)  lactated ringers bolus 1,000 mL (has no administration in time range)  lactated ringers bolus 2,000 mL (0 mLs Intravenous Stopped 07/30/23 1515)  metoCLOPramide (REGLAN) injection 10 mg (10 mg Intravenous Given 07/30/23 1420)  diphenhydrAMINE (BENADRYL) injection 25 mg (25 mg Intravenous Given 07/30/23 1419)  iohexol (OMNIPAQUE) 350 MG/ML injection 75 mL (75 mLs Intravenous Contrast Given 07/30/23 1347)  ceFEPIme (MAXIPIME) 2 g in sodium chloride 0.9 % 100 mL IVPB (0 g Intravenous Stopped 07/30/23 1601)    ED Course/ Medical Decision Making/ A&P                                 Medical Decision Making Amount and/or Complexity of Data Reviewed Labs: ordered. Radiology: ordered.  Risk Prescription drug management. Decision regarding hospitalization.   This patient presents to the ED for concern of syncope, this involves an extensive number of treatment options, and is a complaint that carries with it a high risk of complications and morbidity.  The differential diagnosis includes dehydration, anemia, infection, vasovagal episode, arrhythmia, seizure, CVA, ICH   Co morbidities that complicate the patient evaluation  Anxiety, depression   Additional history obtained:  Additional history obtained from patient's husband External records from  outside source obtained and reviewed including EMR   Lab Tests:  I Ordered, and personally interpreted labs.  The pertinent results include: Leukocytosis is present.  Anemia is present consistent with prior lab work.  Microcytosis suggest chronicity.  Glucose is elevated without clear evidence of DKA.  Lactic acidosis is elevated concerning for severe sepsis.  Alternatively, this could just be from severe dehydration.   Imaging Studies ordered:  I ordered imaging studies including CT of head, cervical spine, chest, abdomen, pelvis; MRI brain I independently visualized and interpreted imaging which showed CT of head showed a right parieto-occipital hypodensity concerning for subacute infarct or underlying mass.  CT of CT of chest, abdomen, pelvis did not show acute findings but did show what appear to be chronic findings consistent with pulmonary fibrosis, small irregular lung nodule, findings consistent with cirrhosis, evidence of splenic and gastric varices. I agree with the radiologist interpretation   Cardiac Monitoring: / EKG:  The patient was maintained on a cardiac monitor.  I personally viewed and interpreted the cardiac monitored which showed an underlying rhythm of: Sinus rhythm  Problem List / ED Course / Critical interventions / Medication management  Patient presenting for syncopal episode.  This follows several days of headache, polyuria, polydipsia, nausea, and vomiting.  Glucometer with EMS read "high".  CBG on arrival was 405.  Patient likely has undiagnosed diabetes.  Vital signs on arrival notable for tachycardia and hypotension.  2 L of IV fluid were ordered.  Workup was initiated.  Reglan and Benadryl were ordered for headache and nausea.  Patient has no focal deficits on exam.  I suspect recent symptoms have been exacerbated by dehydration.  She does think that she hit her head when she fell today.  Will check imaging to assess for acute injuries.  Lab work showed  leukocytosis.  Patient was treated empirically for sepsis with broad-spectrum antibiotics.  She does meet SIRS criteria.  She does not appear to be in DKA.  Headache and nausea were treated with Benadryl and Reglan.  Symptoms did improve.  On imaging, patient has area of hypodensity on CT head concerning for subacute stroke or underlying mass.  MRI was ordered to further evaluate.  Initial lactic acid was elevated.  Additional IV fluids were ordered.  No source of infection was identified while in the ED.  Cultures are pending.  Patient was admitted for further management. I ordered medication including  IV fluids for hydration; broad-spectrum antibiotics for empiric treatment of sepsis; Reglan Benadryl for headache and nausea. Reevaluation of the patient after these medicines showed that the patient improved I have reviewed the patients home medicines and have made adjustments as needed   Social Determinants of Health:  Lives at home with family  CRITICAL CARE Performed by: Gloris Manchester   Total critical care time: 32 minutes  Critical care time was exclusive of separately billable procedures and treating other patients.  Critical care was necessary to treat or prevent imminent or life-threatening deterioration.  Critical care was time spent personally by me on the following activities: development of treatment plan with patient and/or surrogate as well as nursing, discussions with consultants, evaluation of patient's response to treatment, examination of patient, obtaining history from patient or surrogate, ordering and performing treatments and interventions, ordering and review of laboratory studies, ordering and review of radiographic studies, pulse oximetry and re-evaluation of patient's condition.       Final Clinical Impression(s) / ED Diagnoses Final diagnoses:  Syncope and collapse  Hyperglycemia  SIRS (systemic inflammatory response syndrome) (HCC)    Rx / DC Orders ED  Discharge Orders     None         Gloris Manchester, MD 07/30/23 1614    Gloris Manchester, MD 07/30/23 [phone]

## 2023-07-30 NOTE — ED Triage Notes (Signed)
Pt BIB EMS from home pt has had increased urination, increased thirst, for several days. Pt also had syncopal episode at home. Family found pt on ground. Pt states she remembers falling and did not hit head. Pt states she just feels off. EMS CBG read High. Pt states she felt herself shaking prior to falling. Not diagnosed with diabetes.

## 2023-07-30 NOTE — ED Notes (Addendum)
 CCMD notified. Pt on monitor.

## 2023-07-30 NOTE — Progress Notes (Signed)
ED Pharmacy Antibiotic Sign Off An antibiotic consult was received from an ED provider for cefepime and vancomycin per pharmacy dosing for sepsis. A chart review was completed to assess appropriateness.  A single dose of cefepime and flagyl  placed by the ED provider.   The following one time order(s) were placed per pharmacy consult:  vancomycin 1750 mg x 1 dose  Further antibiotic and/or antibiotic pharmacy consults should be ordered by the admitting provider if indicated.   Thank you for allowing pharmacy to be a part of this patient's care.   Delmar Landau, PharmD, BCPS 07/30/2023 1:52 PM ED Clinical Pharmacist -  [phone]

## 2023-07-30 NOTE — Progress Notes (Signed)
Received after-hours page from [facility] radiology read already and final read of MRI brain with multiple acute infarcts.  Given the many territories involved, likely an embolic source.  Differential at this time include septic vs cardio embolic > vascular etiologies. Spoke with Dr. Wilford Corner from neurology, who will see patient later tonight.  Until then, the plan is as follows:  - No DAPT or anticoagulation given the concern for septic emboli and high risk of bleeding - CBC with differential in the morning and follow-up cultures - Continue antibiotic regimen - 2D echocardiogram ordered - Neuroconsult to follow; appreciate guidance and assistance with this case  Morene Crocker, MD 11:39 PM 07/30/2023

## 2023-07-30 NOTE — Telephone Encounter (Addendum)
Patient Product/process development scientist completed.    The patient is insured through [facility]. Patient has [employer], may use a copay card, and/or apply for patient assistance if available.    Ran test claim for Lantus Pen and the current 30 day co-pay is $18.70.  Ran test claim for Novolog FlexPen and the current 30 day co-pay is $27.06.  Ran test claim for Dexcom G7 Sensor and Requires Prior Authorization  Ran test claim for [employer] 3 Sensor and Not Covered  This test claim was processed through [employer]- copay amounts may vary at other pharmacies due to [employer], or as the patient moves through the different stages of their insurance plan.     Roland Earl, CPHT Pharmacy Technician III Certified Patient Advocate [facility] Patient Advocate Team Direct Number: [phone]  Fax: [phone]

## 2023-07-31 ENCOUNTER — Observation Stay (HOSPITAL_COMMUNITY): Payer: Commercial Managed Care - PPO

## 2023-07-31 ENCOUNTER — Observation Stay (HOSPITAL_COMMUNITY): Payer: Commercial Managed Care - PPO | Admitting: Anesthesiology

## 2023-07-31 ENCOUNTER — Encounter (HOSPITAL_COMMUNITY)
Admission: EM | Disposition: A | Discharge status: Home / Self Care | Payer: Self-pay | Source: Home / Self Care | Attending: Internal Medicine

## 2023-07-31 ENCOUNTER — Inpatient Hospital Stay (HOSPITAL_COMMUNITY): Payer: Commercial Managed Care - PPO

## 2023-07-31 DIAGNOSIS — I639 Cerebral infarction, unspecified: Secondary | ICD-10-CM

## 2023-07-31 DIAGNOSIS — J84112 Idiopathic pulmonary fibrosis: Secondary | ICD-10-CM | POA: Diagnosis present

## 2023-07-31 DIAGNOSIS — K5900 Constipation, unspecified: Secondary | ICD-10-CM | POA: Diagnosis not present

## 2023-07-31 DIAGNOSIS — K921 Melena: Secondary | ICD-10-CM | POA: Diagnosis not present

## 2023-07-31 DIAGNOSIS — I8501 Esophageal varices with bleeding: Secondary | ICD-10-CM | POA: Diagnosis not present

## 2023-07-31 DIAGNOSIS — R739 Hyperglycemia, unspecified: Secondary | ICD-10-CM | POA: Diagnosis not present

## 2023-07-31 DIAGNOSIS — I8511 Secondary esophageal varices with bleeding: Secondary | ICD-10-CM | POA: Diagnosis not present

## 2023-07-31 DIAGNOSIS — K7290 Hepatic failure, unspecified without coma: Secondary | ICD-10-CM | POA: Insufficient documentation

## 2023-07-31 DIAGNOSIS — A419 Sepsis, unspecified organism: Secondary | ICD-10-CM | POA: Diagnosis not present

## 2023-07-31 DIAGNOSIS — I868 Varicose veins of other specified sites: Secondary | ICD-10-CM | POA: Diagnosis present

## 2023-07-31 DIAGNOSIS — I6389 Other cerebral infarction: Secondary | ICD-10-CM

## 2023-07-31 DIAGNOSIS — K55051 Focal (segmental) acute (reversible) ischemia of intestine, part unspecified: Secondary | ICD-10-CM | POA: Diagnosis not present

## 2023-07-31 DIAGNOSIS — E1100 Type 2 diabetes mellitus with hyperosmolarity without nonketotic hyperglycemic-hyperosmolar coma (NKHHC): Secondary | ICD-10-CM

## 2023-07-31 DIAGNOSIS — W19XXXA Unspecified fall, initial encounter: Secondary | ICD-10-CM | POA: Diagnosis present

## 2023-07-31 DIAGNOSIS — K7581 Nonalcoholic steatohepatitis (NASH): Secondary | ICD-10-CM | POA: Diagnosis present

## 2023-07-31 DIAGNOSIS — I700 Atherosclerosis of aorta: Secondary | ICD-10-CM | POA: Diagnosis present

## 2023-07-31 DIAGNOSIS — K7469 Other cirrhosis of liver: Secondary | ICD-10-CM | POA: Diagnosis present

## 2023-07-31 DIAGNOSIS — G936 Cerebral edema: Secondary | ICD-10-CM | POA: Diagnosis present

## 2023-07-31 DIAGNOSIS — R55 Syncope and collapse: Secondary | ICD-10-CM | POA: Diagnosis present

## 2023-07-31 DIAGNOSIS — R569 Unspecified convulsions: Secondary | ICD-10-CM

## 2023-07-31 DIAGNOSIS — E669 Obesity, unspecified: Secondary | ICD-10-CM | POA: Diagnosis present

## 2023-07-31 DIAGNOSIS — D696 Thrombocytopenia, unspecified: Secondary | ICD-10-CM | POA: Diagnosis present

## 2023-07-31 DIAGNOSIS — I6310 Cerebral infarction due to embolism of unspecified precerebral artery: Secondary | ICD-10-CM | POA: Diagnosis not present

## 2023-07-31 DIAGNOSIS — I864 Gastric varices: Secondary | ICD-10-CM | POA: Insufficient documentation

## 2023-07-31 DIAGNOSIS — I9789 Other postprocedural complications and disorders of the circulatory system, not elsewhere classified: Secondary | ICD-10-CM | POA: Diagnosis not present

## 2023-07-31 DIAGNOSIS — K7460 Unspecified cirrhosis of liver: Secondary | ICD-10-CM | POA: Diagnosis not present

## 2023-07-31 DIAGNOSIS — K922 Gastrointestinal hemorrhage, unspecified: Secondary | ICD-10-CM

## 2023-07-31 DIAGNOSIS — I6340 Cerebral infarction due to embolism of unspecified cerebral artery: Secondary | ICD-10-CM | POA: Diagnosis not present

## 2023-07-31 DIAGNOSIS — K920 Hematemesis: Secondary | ICD-10-CM | POA: Diagnosis not present

## 2023-07-31 DIAGNOSIS — R6510 Systemic inflammatory response syndrome (SIRS) of non-infectious origin without acute organ dysfunction: Secondary | ICD-10-CM | POA: Diagnosis not present

## 2023-07-31 DIAGNOSIS — R578 Other shock: Secondary | ICD-10-CM | POA: Diagnosis not present

## 2023-07-31 DIAGNOSIS — K766 Portal hypertension: Secondary | ICD-10-CM | POA: Diagnosis present

## 2023-07-31 DIAGNOSIS — Y838 Other surgical procedures as the cause of abnormal reaction of the patient, or of later complication, without mention of misadventure at the time of the procedure: Secondary | ICD-10-CM | POA: Diagnosis not present

## 2023-07-31 DIAGNOSIS — R6520 Severe sepsis without septic shock: Secondary | ICD-10-CM | POA: Diagnosis not present

## 2023-07-31 DIAGNOSIS — D62 Acute posthemorrhagic anemia: Secondary | ICD-10-CM | POA: Diagnosis not present

## 2023-07-31 DIAGNOSIS — R791 Abnormal coagulation profile: Secondary | ICD-10-CM | POA: Diagnosis present

## 2023-07-31 DIAGNOSIS — R233 Spontaneous ecchymoses: Secondary | ICD-10-CM | POA: Diagnosis present

## 2023-07-31 DIAGNOSIS — D638 Anemia in other chronic diseases classified elsewhere: Secondary | ICD-10-CM | POA: Diagnosis present

## 2023-07-31 DIAGNOSIS — D735 Infarction of spleen: Secondary | ICD-10-CM | POA: Diagnosis not present

## 2023-07-31 HISTORY — DX: Gastrointestinal hemorrhage, unspecified: K92.2

## 2023-07-31 HISTORY — PX: HEMOSTASIS CONTROL: SHX6838

## 2023-07-31 HISTORY — PX: ESOPHAGOGASTRODUODENOSCOPY (EGD) WITH PROPOFOL: SHX5813

## 2023-07-31 HISTORY — DX: Cerebral infarction, unspecified: I63.9

## 2023-07-31 HISTORY — DX: Varicose veins of other specified sites: I86.8

## 2023-07-31 LAB — CBC WITH DIFFERENTIAL/PLATELET
Abs Immature Granulocytes: 0.14 10*3/uL — ABNORMAL HIGH (ref 0.00–0.07)
Basophils Absolute: 0 10*3/uL (ref 0.0–0.1)
Basophils Relative: 0 %
Eosinophils Absolute: 0 10*3/uL (ref 0.0–0.5)
Eosinophils Relative: 0 %
HCT: 18.5 % — ABNORMAL LOW (ref 36.0–46.0)
Hemoglobin: 5.3 g/dL — CL (ref 12.0–15.0)
Immature Granulocytes: 1 %
Lymphocytes Relative: 25 %
Lymphs Abs: 3.9 10*3/uL (ref 0.7–4.0)
MCH: 18.9 pg — ABNORMAL LOW (ref 26.0–34.0)
MCHC: 28.6 g/dL — ABNORMAL LOW (ref 30.0–36.0)
MCV: 66.1 fL — ABNORMAL LOW (ref 80.0–100.0)
Monocytes Absolute: 0.9 10*3/uL (ref 0.1–1.0)
Monocytes Relative: 6 %
Neutro Abs: 10.7 10*3/uL — ABNORMAL HIGH (ref 1.7–7.7)
Neutrophils Relative %: 68 %
Platelets: 155 10*3/uL (ref 150–400)
RBC: 2.8 MIL/uL — ABNORMAL LOW (ref 3.87–5.11)
RDW: 22.1 % — ABNORMAL HIGH (ref 11.5–15.5)
Smear Review: NORMAL
WBC: 15.7 10*3/uL — ABNORMAL HIGH (ref 4.0–10.5)
nRBC: 0 % (ref 0.0–0.2)

## 2023-07-31 LAB — TSH: TSH: 3.586 u[IU]/mL (ref 0.350–4.500)

## 2023-07-31 LAB — GLUCOSE, CAPILLARY
Glucose-Capillary: 357 mg/dL — ABNORMAL HIGH (ref 70–99)
Glucose-Capillary: 342 mg/dL — ABNORMAL HIGH (ref 70–99)
Glucose-Capillary: 320 mg/dL — ABNORMAL HIGH (ref 70–99)
Glucose-Capillary: 240 mg/dL — ABNORMAL HIGH (ref 70–99)
Glucose-Capillary: 216 mg/dL — ABNORMAL HIGH (ref 70–99)
Glucose-Capillary: 193 mg/dL — ABNORMAL HIGH (ref 70–99)
Glucose-Capillary: 168 mg/dL — ABNORMAL HIGH (ref 70–99)
Glucose-Capillary: 137 mg/dL — ABNORMAL HIGH (ref 70–99)
Glucose-Capillary: 136 mg/dL — ABNORMAL HIGH (ref 70–99)
Glucose-Capillary: 151 mg/dL — ABNORMAL HIGH (ref 70–99)
Glucose-Capillary: 153 mg/dL — ABNORMAL HIGH (ref 70–99)
Glucose-Capillary: 168 mg/dL — ABNORMAL HIGH (ref 70–99)
Glucose-Capillary: 146 mg/dL — ABNORMAL HIGH (ref 70–99)

## 2023-07-31 LAB — CBC
HCT: 28.4 % — ABNORMAL LOW (ref 36.0–46.0)
Hemoglobin: 9.1 g/dL — ABNORMAL LOW (ref 12.0–15.0)
MCH: 23.6 pg — ABNORMAL LOW (ref 26.0–34.0)
MCHC: 32 g/dL (ref 30.0–36.0)
MCV: 73.6 fL — ABNORMAL LOW (ref 80.0–100.0)
Platelets: 135 10*3/uL — ABNORMAL LOW (ref 150–400)
RBC: 3.86 MIL/uL — ABNORMAL LOW (ref 3.87–5.11)
RDW: 25.5 % — ABNORMAL HIGH (ref 11.5–15.5)
WBC: 16.7 10*3/uL — ABNORMAL HIGH (ref 4.0–10.5)
nRBC: 0 % (ref 0.0–0.2)

## 2023-07-31 LAB — BASIC METABOLIC PANEL
Anion gap: 9 (ref 5–15)
Anion gap: 11 (ref 5–15)
Anion gap: 6 (ref 5–15)
BUN: 25 mg/dL — ABNORMAL HIGH (ref 8–23)
BUN: 25 mg/dL — ABNORMAL HIGH (ref 8–23)
BUN: 23 mg/dL (ref 8–23)
CO2: 20 mmol/L — ABNORMAL LOW (ref 22–32)
CO2: 20 mmol/L — ABNORMAL LOW (ref 22–32)
CO2: 23 mmol/L (ref 22–32)
Calcium: 7.8 mg/dL — ABNORMAL LOW (ref 8.9–10.3)
Calcium: 7.9 mg/dL — ABNORMAL LOW (ref 8.9–10.3)
Calcium: 7.6 mg/dL — ABNORMAL LOW (ref 8.9–10.3)
Chloride: 110 mmol/L (ref 98–111)
Chloride: 110 mmol/L (ref 98–111)
Chloride: 111 mmol/L (ref 98–111)
Creatinine, Ser: 0.58 mg/dL (ref 0.44–1.00)
Creatinine, Ser: 0.58 mg/dL (ref 0.44–1.00)
Creatinine, Ser: 0.62 mg/dL (ref 0.44–1.00)
GFR, Estimated: >60 mL/min (ref >60)
GFR, Estimated: >60 mL/min (ref >60)
GFR, Estimated: >60 mL/min (ref >60)
Glucose, Bld: 153 mg/dL — ABNORMAL HIGH (ref 70–99)
Glucose, Bld: 164 mg/dL — ABNORMAL HIGH (ref 70–99)
Glucose, Bld: 145 mg/dL — ABNORMAL HIGH (ref 70–99)
Potassium: 3.7 mmol/L (ref 3.5–5.1)
Potassium: 3.8 mmol/L (ref 3.5–5.1)
Potassium: 3.5 mmol/L (ref 3.5–5.1)
Sodium: 139 mmol/L (ref 135–145)
Sodium: 141 mmol/L (ref 135–145)
Sodium: 140 mmol/L (ref 135–145)

## 2023-07-31 LAB — COMPREHENSIVE METABOLIC PANEL
ALT: 34 U/L (ref 0–44)
AST: 43 U/L — ABNORMAL HIGH (ref 15–41)
Albumin: 2.2 g/dL — ABNORMAL LOW (ref 3.5–5.0)
Alkaline Phosphatase: 35 U/L — ABNORMAL LOW (ref 38–126)
Anion gap: 13 (ref 5–15)
BUN: 34 mg/dL — ABNORMAL HIGH (ref 8–23)
CO2: 15 mmol/L — ABNORMAL LOW (ref 22–32)
Calcium: 7.5 mg/dL — ABNORMAL LOW (ref 8.9–10.3)
Chloride: 105 mmol/L (ref 98–111)
Creatinine, Ser: 0.77 mg/dL (ref 0.44–1.00)
GFR, Estimated: >60 mL/min (ref >60)
Glucose, Bld: 472 mg/dL — ABNORMAL HIGH (ref 70–99)
Potassium: 4.3 mmol/L (ref 3.5–5.1)
Sodium: 133 mmol/L — ABNORMAL LOW (ref 135–145)
Total Bilirubin: 0.6 mg/dL (ref <1.2)
Total Protein: 3.9 g/dL — ABNORMAL LOW (ref 6.5–8.1)

## 2023-07-31 LAB — PREPARE RBC (CROSSMATCH)

## 2023-07-31 LAB — LIPID PANEL
Cholesterol: 104 mg/dL (ref 0–200)
HDL: 23 mg/dL — ABNORMAL LOW (ref >40)
LDL Cholesterol: 63 mg/dL (ref 0–99)
Total CHOL/HDL Ratio: 4.5 {ratio}
Triglycerides: 89 mg/dL (ref <150)
VLDL: 18 mg/dL (ref 0–40)

## 2023-07-31 LAB — URINALYSIS, ROUTINE W REFLEX MICROSCOPIC
Bacteria, UA: NONE SEEN
Bilirubin Urine: NEGATIVE
Glucose, UA: >=500 mg/dL — AB
Ketones, ur: 20 mg/dL — AB
Leukocytes,Ua: NEGATIVE
Nitrite: NEGATIVE
Protein, ur: NEGATIVE mg/dL
Specific Gravity, Urine: 1.025 (ref 1.005–1.030)
pH: 5 (ref 5.0–8.0)

## 2023-07-31 LAB — HEPATITIS B SURFACE ANTIGEN: Hepatitis B Surface Ag: NONREACTIVE

## 2023-07-31 LAB — ECHOCARDIOGRAM COMPLETE BUBBLE STUDY
AR max vel: 2.28 cm2
AV Area VTI: 2.26 cm2
AV Area mean vel: 2.13 cm2
AV Mean grad: 5 mm[Hg]
AV Peak grad: 9.9 mm[Hg]
Ao pk vel: 1.57 m/s
Area-P 1/2: 4.21 cm2
MV VTI: 2.96 cm2
S' Lateral: 3.3 cm

## 2023-07-31 LAB — HEMOGLOBIN AND HEMATOCRIT, BLOOD
HCT: 25.6 % — ABNORMAL LOW (ref 36.0–46.0)
HCT: 22.7 % — ABNORMAL LOW (ref 36.0–46.0)
Hemoglobin: 8.4 g/dL — ABNORMAL LOW (ref 12.0–15.0)
Hemoglobin: 7.2 g/dL — ABNORMAL LOW (ref 12.0–15.0)

## 2023-07-31 LAB — HEPATITIS B CORE ANTIBODY, TOTAL: Hep B Core Total Ab: NONREACTIVE

## 2023-07-31 LAB — PROCALCITONIN: Procalcitonin: 0.12 ng/mL

## 2023-07-31 LAB — ABO/RH: ABO/RH(D): O NEG

## 2023-07-31 LAB — HEPATITIS A ANTIBODY, TOTAL: hep A Total Ab: NONREACTIVE

## 2023-07-31 LAB — OSMOLALITY: Osmolality: 301 mosm/kg — ABNORMAL HIGH (ref 275–295)

## 2023-07-31 LAB — AMMONIA: Ammonia: 32 umol/L (ref 9–35)

## 2023-07-31 LAB — HEPATITIS C ANTIBODY: HCV Ab: NONREACTIVE

## 2023-07-31 LAB — PROTIME-INR
INR: 1.4 — ABNORMAL HIGH (ref 0.8–1.2)
Prothrombin Time: 16.9 s — ABNORMAL HIGH (ref 11.4–15.2)

## 2023-07-31 LAB — LACTIC ACID, PLASMA
Lactic Acid, Venous: 8.4 mmol/L (ref 0.5–1.9)
Lactic Acid, Venous: 3.5 mmol/L (ref 0.5–1.9)

## 2023-07-31 LAB — HEPATITIS A ANTIBODY, IGM: Hep A IgM: NONREACTIVE

## 2023-07-31 LAB — MAGNESIUM: Magnesium: 1.7 mg/dL (ref 1.7–2.4)

## 2023-07-31 LAB — MRSA NEXT GEN BY PCR, NASAL: MRSA by PCR Next Gen: NOT DETECTED

## 2023-07-31 LAB — T4, FREE: Free T4: 0.85 ng/dL (ref 0.61–1.12)

## 2023-07-31 LAB — PHOSPHORUS: Phosphorus: 3.5 mg/dL (ref 2.5–4.6)

## 2023-07-31 SURGERY — ESOPHAGOGASTRODUODENOSCOPY (EGD) WITH PROPOFOL
Anesthesia: General

## 2023-07-31 MED ORDER — SODIUM CHLORIDE 0.9 % IV SOLN
2.0000 g | Freq: Three times a day (TID) | INTRAVENOUS | Status: DC
  Administered 2023-07-31: 2 g via INTRAVENOUS
  Filled 2023-07-31: qty 12.5

## 2023-07-31 MED ORDER — DEXTROSE 50 % IV SOLN: 0.0000 mL | INTRAVENOUS | Status: DC | PRN

## 2023-07-31 MED ORDER — ONDANSETRON HCL 4 MG/2ML IJ SOLN
INTRAMUSCULAR | Status: DC | PRN
  Administered 2023-07-31: 4 mg via INTRAVENOUS

## 2023-07-31 MED ORDER — SODIUM CHLORIDE 0.9 % IV BOLUS: 1000.0000 mL | Freq: Once | INTRAVENOUS | Status: DC | PRN

## 2023-07-31 MED ORDER — INSULIN GLARGINE-YFGN 100 UNIT/ML ~~LOC~~ SOLN
12.0000 [IU] | Freq: Two times a day (BID) | SUBCUTANEOUS | Status: DC
  Administered 2023-07-31 – 2023-08-04 (×10): 12 [IU] via SUBCUTANEOUS
  Filled 2023-07-31 (×14): qty 0.12

## 2023-07-31 MED ORDER — INSULIN ASPART 100 UNIT/ML IJ SOLN: 3.0000 [IU] | INTRAMUSCULAR | Status: DC

## 2023-07-31 MED ORDER — SODIUM CHLORIDE 0.9 % IV SOLN
2.0000 g | INTRAVENOUS | Status: AC
  Administered 2023-07-31 – 2023-08-05 (×7): 2 g via INTRAVENOUS
  Filled 2023-07-31 (×6): qty 20

## 2023-07-31 MED ORDER — SODIUM CHLORIDE 0.9% IV SOLUTION: Freq: Once | INTRAVENOUS | Status: AC

## 2023-07-31 MED ORDER — CALCIUM GLUCONATE-NACL 1-0.675 GM/50ML-% IV SOLN
1.0000 g | Freq: Once | INTRAVENOUS | Status: AC
  Administered 2023-07-31: 1000 mg via INTRAVENOUS
  Filled 2023-07-31: qty 50

## 2023-07-31 MED ORDER — INSULIN ASPART 100 UNIT/ML IJ SOLN: 2.0000 [IU] | INTRAMUSCULAR | Status: DC

## 2023-07-31 MED ORDER — INSULIN ASPART 100 UNIT/ML IJ SOLN
2.0000 [IU] | INTRAMUSCULAR | Status: DC
  Administered 2023-07-31 (×2): 4 [IU] via SUBCUTANEOUS
  Administered 2023-07-31: 2 [IU] via SUBCUTANEOUS
  Administered 2023-08-01: 4 [IU] via SUBCUTANEOUS
  Administered 2023-08-01: 2 [IU] via SUBCUTANEOUS
  Administered 2023-08-01 (×2): 6 [IU] via SUBCUTANEOUS
  Administered 2023-08-02 (×3): 4 [IU] via SUBCUTANEOUS
  Administered 2023-08-02 (×2): 2 [IU] via SUBCUTANEOUS
  Administered 2023-08-03: 4 [IU] via SUBCUTANEOUS
  Administered 2023-08-03: 2 [IU] via SUBCUTANEOUS
  Administered 2023-08-03: 4 [IU] via SUBCUTANEOUS
  Administered 2023-08-03: 2 [IU] via SUBCUTANEOUS
  Administered 2023-08-03: 4 [IU] via SUBCUTANEOUS
  Administered 2023-08-04: 6 [IU] via SUBCUTANEOUS
  Administered 2023-08-04: 2 [IU] via SUBCUTANEOUS
  Administered 2023-08-04: 6 [IU] via SUBCUTANEOUS
  Administered 2023-08-05: 4 [IU] via SUBCUTANEOUS
  Administered 2023-08-05: 6 [IU] via SUBCUTANEOUS

## 2023-07-31 MED ORDER — ONDANSETRON HCL 4 MG/2ML IJ SOLN
4.0000 mg | Freq: Four times a day (QID) | INTRAMUSCULAR | Status: DC | PRN
  Administered 2023-07-31: 4 mg via INTRAVENOUS

## 2023-07-31 MED ORDER — INSULIN DETEMIR 100 UNIT/ML ~~LOC~~ SOLN
36.0000 [IU] | Freq: Two times a day (BID) | SUBCUTANEOUS | Status: DC
  Filled 2023-07-31 (×2): qty 0.36

## 2023-07-31 MED ORDER — INSULIN REGULAR(HUMAN) IN NACL 100-0.9 UT/100ML-% IV SOLN
INTRAVENOUS | Status: DC
  Administered 2023-07-31: 7.5 [IU]/h via INTRAVENOUS
  Administered 2023-07-31: 16 [IU]/h via INTRAVENOUS
  Filled 2023-07-31 (×2): qty 100

## 2023-07-31 MED ORDER — DEXTROSE IN LACTATED RINGERS 5 % IV SOLN: INTRAVENOUS | Status: DC

## 2023-07-31 MED ORDER — FENTANYL CITRATE (PF) 250 MCG/5ML IJ SOLN
INTRAMUSCULAR | Status: DC | PRN
  Administered 2023-07-31: 100 ug via INTRAVENOUS

## 2023-07-31 MED ORDER — PANTOPRAZOLE SODIUM 40 MG IV SOLR
80.0000 mg | Freq: Once | INTRAVENOUS | Status: AC
  Administered 2023-07-31: 80 mg via INTRAVENOUS
  Filled 2023-07-31: qty 20

## 2023-07-31 MED ORDER — SODIUM CHLORIDE 0.9% IV SOLUTION: Freq: Once | INTRAVENOUS | Status: DC

## 2023-07-31 MED ORDER — LACTATED RINGERS IV SOLN: INTRAVENOUS | Status: AC

## 2023-07-31 MED ORDER — SODIUM CHLORIDE 0.9 % IV SOLN
50.0000 ug/h | INTRAVENOUS | Status: DC
  Administered 2023-07-31 – 2023-08-05 (×10): 50 ug/h via INTRAVENOUS
  Filled 2023-07-31 (×14): qty 1

## 2023-07-31 MED ORDER — FENTANYL CITRATE (PF) 100 MCG/2ML IJ SOLN
INTRAMUSCULAR | Status: AC
  Filled 2023-07-31: qty 2

## 2023-07-31 MED ORDER — INSULIN DETEMIR 100 UNIT/ML ~~LOC~~ SOLN
15.0000 [IU] | Freq: Two times a day (BID) | SUBCUTANEOUS | Status: DC
  Filled 2023-07-31 (×2): qty 0.15

## 2023-07-31 MED ORDER — INSULIN GLARGINE-YFGN 100 UNIT/ML ~~LOC~~ SOLN
36.0000 [IU] | Freq: Two times a day (BID) | SUBCUTANEOUS | Status: DC
  Filled 2023-07-31 (×2): qty 0.36

## 2023-07-31 MED ORDER — PROPOFOL 10 MG/ML IV BOLUS
INTRAVENOUS | Status: DC | PRN
  Administered 2023-07-31: 120 mg via INTRAVENOUS

## 2023-07-31 MED ORDER — STROKE: EARLY STAGES OF RECOVERY BOOK
Freq: Once | Status: AC
  Administered 2023-08-01: 1
  Filled 2023-07-31: qty 1

## 2023-07-31 MED ORDER — ORAL CARE MOUTH RINSE: 15.0000 mL | OROMUCOSAL | Status: DC | PRN

## 2023-07-31 MED ORDER — MAGNESIUM SULFATE 2 GM/50ML IV SOLN
2.0000 g | Freq: Once | INTRAVENOUS | Status: AC
  Administered 2023-07-31: 2 g via INTRAVENOUS
  Filled 2023-07-31: qty 50

## 2023-07-31 MED ORDER — ALBUMIN HUMAN 5 % IV SOLN: INTRAVENOUS | Status: DC | PRN

## 2023-07-31 MED ORDER — CHLORHEXIDINE GLUCONATE CLOTH 2 % EX PADS
6.0000 | MEDICATED_PAD | Freq: Every day | CUTANEOUS | Status: DC
  Administered 2023-07-31 – 2023-08-10 (×9): 6 via TOPICAL

## 2023-07-31 MED ORDER — PHENYLEPHRINE 80 MCG/ML (10ML) SYRINGE FOR IV PUSH (FOR BLOOD PRESSURE SUPPORT)
PREFILLED_SYRINGE | INTRAVENOUS | Status: DC | PRN
  Administered 2023-07-31 (×3): 160 ug via INTRAVENOUS

## 2023-07-31 MED ORDER — SODIUM CHLORIDE 0.9 % IV SOLN
12.5000 mg | Freq: Four times a day (QID) | INTRAVENOUS | Status: DC | PRN
  Administered 2023-07-31: 12.5 mg via INTRAVENOUS
  Filled 2023-07-31: qty 0.5

## 2023-07-31 MED ORDER — SODIUM CHLORIDE 0.9 % IV SOLN: 25.0000 mg | Freq: Four times a day (QID) | INTRAVENOUS | Status: DC | PRN

## 2023-07-31 MED ORDER — PANTOPRAZOLE SODIUM 40 MG IV SOLR
40.0000 mg | Freq: Two times a day (BID) | INTRAVENOUS | Status: DC
  Administered 2023-08-03 – 2023-08-11 (×17): 40 mg via INTRAVENOUS
  Filled 2023-07-31 (×17): qty 10

## 2023-07-31 MED ORDER — METOCLOPRAMIDE HCL 5 MG/ML IJ SOLN
10.0000 mg | Freq: Once | INTRAMUSCULAR | Status: AC
  Administered 2023-07-31: 10 mg via INTRAVENOUS
  Filled 2023-07-31: qty 2

## 2023-07-31 MED ORDER — PANTOPRAZOLE SODIUM 40 MG IV SOLR
40.0000 mg | Freq: Four times a day (QID) | INTRAVENOUS | Status: DC
  Administered 2023-07-31 – 2023-08-03 (×11): 40 mg via INTRAVENOUS
  Filled 2023-07-31 (×11): qty 10

## 2023-07-31 MED ORDER — IOHEXOL 350 MG/ML SOLN
75.0000 mL | Freq: Once | INTRAVENOUS | Status: AC | PRN
  Administered 2023-07-31: 75 mL via INTRAVENOUS

## 2023-07-31 MED ORDER — VANCOMYCIN HCL IN DEXTROSE 1-5 GM/200ML-% IV SOLN
1000.0000 mg | Freq: Two times a day (BID) | INTRAVENOUS | Status: DC
  Administered 2023-07-31: 1000 mg via INTRAVENOUS
  Filled 2023-07-31: qty 200

## 2023-07-31 MED ORDER — SUCCINYLCHOLINE CHLORIDE 200 MG/10ML IV SOSY
PREFILLED_SYRINGE | INTRAVENOUS | Status: DC | PRN
  Administered 2023-07-31: 120 mg via INTRAVENOUS

## 2023-07-31 MED ORDER — OCTREOTIDE LOAD VIA INFUSION
50.0000 ug | Freq: Once | INTRAVENOUS | Status: AC
  Administered 2023-07-31: 50 ug via INTRAVENOUS
  Filled 2023-07-31: qty 25

## 2023-07-31 SURGICAL SUPPLY — 14 items

## 2023-07-31 NOTE — Significant Event (Addendum)
Rapid Response Event Note   Reason for Call :  RED MEWS, tachycardia-120s, GIB   Called earlier in night(0031) to report YELLOW MEWS for tachycardia-105, other VSS. On chart review, HR unchanged at that time.   RRT called back at 0200 d/t HR-120s s/p pt having large BM that was dark red with clots.  Initial Focused Assessment:  Pt lying in bed with eyes closed. She will awaken to voice but is very sleepy. She is alert and oriented, c/o dizziness, denies CP/SOB. Lungs CTA. ABD large/distended/soft. Skin cool/clammy/pale.   T-97.6, HR-124, BP-82/47, RR-21, SpO2-100% on RA  Interventions:  1L NS bolus CBC/CMP/PT, INR/LA/T&S 2 PIVs started Protonix 80mg  IV NPO GI consult in AM Plan of Care:  BP better after bolus. Pt more awake. Finish bolus and await lab values. Anticipate need for blood transfusion. Please call RRT if further assistance needed.   Event Summary:   MD Notified: Drs. Gomez-Caraballo and Amoako notified by bedside RN and came to bedside Call Time:0200 Arrival Time:0205 End Time:0240   Update: 0309-Hgb-5.3, LA-8.4. Plan:  Octreotide, PRBCs, tx to PCU.   Update: 0335-Pt now vomiting BRB. Pt txed to 3M12, PCCM and GI consulted.    Terrilyn Saver, RN

## 2023-07-31 NOTE — Progress Notes (Addendum)
      This is a 67yo F with newly diagnosed DM, admitted for sepsis and found to have bilateral ischemic infarcts, cirrhosis c/b gastric and splenic varices with upper and lower GI bleeding  The night team got paged by the nurse of this patient regarding patient having large tarry stools,  weakness and nausea  and sustained tachy in the 120s b/p of 99/48 MAP 62 but no vomit. Patient was evaluated at bedside . On arrival, rapid response was already present at bedside inserting peripheral IV lines.   On exam, patient was laying in bed,unconfused, but endorsed dizziness. She  knew where she was,knew her name and could correctly explain why she was admitted into the hospital. Patient had distended abdomen with shifting dullness, hyperactive bowel sounds. No signs of guarding or rebound tenderness.She denies any abdominal pain. Her  BP was  94/59. Cardiac exam was remarkable for Tachycardia .Patient was placed on 2 L  sating at 97%  Of note, she had a a CT abdomen and pelvis yesterday that showed evidence of cirrhosis and splenic and gastric varices in the left upper quadrant. Likely the source of patient's bleeding. Patient will benefit from GI consult.   Patient is already on cefepime for antibiotic prophylaxis.  Will try calling husband Technical brewer ) ,Mr Lorilee Waldie to keep abreast of patient's condition # [phone]  Plan will be as follows -  Recycle vitals every 15 minutes  - STAT CBC with differential  - STAT CMP - GI consult in the morning if patient is hemodynamically unstable  - Give 1 L NS - Type and Screen  - NPO  - Protonix 80 mg IV - PT/INR - Consider blood transfusions if needed  ( Patient consented)   - If patient continues to bleed, will consider one time dose of octreotide bolus followed by an infusion.    Signature: Kathleen Lime , MD Internal Medicine Resident, PGY-1 [facility]  Pager: [phone] 2:29 AM, 07/31/2023   Please  contact the on call pager after 5 pm and on weekends at [phone].

## 2023-07-31 NOTE — Progress Notes (Addendum)
eLink Physician-Brief Progress Note Patient Name: Maria Rose DOB: 01-Sep-1955 MRN: [id number]   Date of Service  07/31/2023  HPI/Events of Note  GIB, Cirrhosis, gastric varices. GI advised IR intervention. As per Dr Tonia Brooms notes to get CTA abdomen . Discussed with RN. Do not have good IV access for CTA per radiology tech. Asking for US guided PIV placement- Ordered  Hg down to 7.2, no active bleeding noted. - 1 PRBC ordered. Follow Hg Asp precautions.  Camera eval done also. VS stable. Able to protect her airways. NPO  Earlier CTA brain, neck no LVO.    eICU Interventions  As above. To go for CTA once gets a good IV access. On octreotide drip     Intervention Category Intermediate Interventions: Other:;Bleeding - evaluation and treatment with blood products  Ranee Gosselin 07/31/2023, 9:34 PM

## 2023-07-31 NOTE — Progress Notes (Signed)
eLink Physician-Brief Progress Note Patient Name: Maria Rose DOB: Mar 04, 1956 MRN: [id number]   Date of Service  07/31/2023  HPI/Events of Note  Admitted with GI bleed. Notified of pink urine.   eICU Interventions  Check UA     Intervention Category Major Interventions: Hemorrhage - evaluation and management  [name] Tycho Cheramie 07/31/2023, 5:46 AM

## 2023-07-31 NOTE — Progress Notes (Signed)
PCCM:  Please see note from earlier this AM. Patient BP stable.  No additional bleeding at this time.  H&H pending  IV reglan  And possible need for scope Appreciate GI help  Josephine Igo, DO [facility] 07/31/2023 9:14 AM

## 2023-07-31 NOTE — Plan of Care (Signed)
  Problem: Coping: Goal: Ability to adjust to condition or change in health will improve Outcome: Progressing   Problem: Skin Integrity: Goal: Risk for impaired skin integrity will decrease Outcome: Progressing   Problem: Tissue Perfusion: Goal: Adequacy of tissue perfusion will improve Outcome: Progressing

## 2023-07-31 NOTE — Progress Notes (Signed)
Echocardiogram 2D Echocardiogram has been performed.  Maria Rose P Darvis Croft 07/31/2023, 1:50 PM

## 2023-07-31 NOTE — Anesthesia Preprocedure Evaluation (Addendum)
Anesthesia Evaluation  Patient identified by MRN, date of birth, ID band Patient awake    Reviewed: Allergy & Precautions, NPO status , Patient's Chart, lab work & pertinent test results  History of Anesthesia Complications Negative for: history of anesthetic complications  Airway Mallampati: II  TM Distance: >3 FB Neck ROM: Full    Dental  (+) Edentulous Upper, Edentulous Lower   Pulmonary Current Smoker and Patient abstained from smoking.   breath sounds clear to auscultation       Cardiovascular negative cardio ROS  Rhythm:Regular Rate:Normal     Neuro/Psych CVA    GI/Hepatic ,,,(+) Cirrhosis   Esophageal Varices    hematemesis   Endo/Other  BMI 29  Renal/GU negative Renal ROS     Musculoskeletal   Abdominal   Peds  Hematology  (+) Blood dyscrasia (Hb 9.1, plt 135k), anemia   Anesthesia Other Findings   Reproductive/Obstetrics                             Anesthesia Physical Anesthesia Plan  ASA: 3  Anesthesia Plan: General   Post-op Pain Management: Minimal or no pain anticipated   Induction: Intravenous and Rapid sequence  PONV Risk Score and Plan: 2 and Ondansetron and Dexamethasone  Airway Management Planned: Oral ETT  Additional Equipment: None  Intra-op Plan:   Post-operative Plan: Extubation in OR  Informed Consent: I have reviewed the patients History and Physical, chart, labs and discussed the procedure including the risks, benefits and alternatives for the proposed anesthesia with the patient or authorized representative who has indicated his/her understanding and acceptance.       Plan Discussed with: CRNA and Surgeon  Anesthesia Plan Comments:        Anesthesia Quick Evaluation

## 2023-07-31 NOTE — Procedures (Addendum)
Patient Name: Maria Rose  MRN: [id number]  Epilepsy Attending: Charlsie Quest  Referring Physician/Provider: Milon Dikes, MD  Date: 07/31/2023 Duration: 28.18 mins  Patient history: 67yo F with an episode of altered mental status getting eeg to evaluate for seizure  Level of alertness: Awake, asleep  AEDs during EEG study: None  Technical aspects: This EEG study was done with scalp electrodes positioned according to the 10-20 International system of electrode placement. Electrical activity was reviewed with band pass filter of 1-70Hz , sensitivity of 7 uV/mm, display speed of 12mm/sec with a 60Hz  notched filter applied as appropriate. EEG data were recorded continuously and digitally stored.  Video monitoring was available and reviewed as appropriate.  Description: The posterior dominant rhythm consists of 9 Hz activity of moderate voltage (25-35 uV) seen predominantly in posterior head regions, asymmetric ( right<left) and reactive to eye opening and eye closing. Sleep was characterized by vertex waves, sleep spindles (12 to 14 Hz), maximal frontocentral region.  EEG showed continuous 3-5hz  theta- delta slowing in right posterior quadrant.  Intermittent generalized 3-5Hz  theta- delta slowing was also noted.  Photic driving was not seen during photic stimulation.  Hyperventilation was not not performed.  ABNORMALITY -Continuous slow, right posterior quadrant -Intermittent slow, generalized  IMPRESSION: This study is suggestive of cortical dysfunction in right posterior quadrant likely secondary to underlying stroke.  Additionally there is mild diffuse encephalopathy.  No seizures or epileptiform discharges were seen throughout the recording.   [name] Annabelle Harman

## 2023-07-31 NOTE — Progress Notes (Signed)
2D echo attempted. Speech with patient. Will try later

## 2023-07-31 NOTE — Inpatient Diabetes Management (Signed)
Inpatient Diabetes Program Recommendations  AACE/ADA: New Consensus Statement on Inpatient Glycemic Control (2015)  Target Ranges:  Prepandial:   less than 140 mg/dL      Peak postprandial:   less than 180 mg/dL (1-2 hours)      Critically ill patients:  140 - 180 mg/dL   Lab Results  Component Value Date   GLUCAP 216 (H) 07/31/2023   HGBA1C 11.4 (H) 07/30/2023    Review of Glycemic Control  Diabetes history: New Diagnosis  Current orders for Inpatient glycemic control:  IV insulin/Endotool  A1c 11.4% 12/23  Note pt with recent ED visit on 10/9 with glucose at that time at 257.  Inpatient Diabetes Program Recommendations:    IV insulin for now, insulin gtt running at 8.5 units/hour  Follow with pt when stable toward the end of admission for education and d/c needs.  Thanks,  Christena Deem RN, MSN, BC-ADM Inpatient Diabetes Coordinator Team Pager [phone] (8a-5p)

## 2023-07-31 NOTE — Evaluation (Signed)
Physical Therapy Evaluation Patient Details Name: Maria Rose MRN: [id number] DOB: July 23, 1956 Today's Date: 07/31/2023  History of Present Illness  67 y.o. female presents to [facility] hospital on 07/30/2023 after a fall and shaking episode. 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 Additional punctate acute infarcts in right cerebellum, left temporal lobe, left occipital lobe, right occipital lobe, right parietal lobe, right frontal lobe, right thalamus, left frontal lobe. Pt with bloody bowel movement and hematemesis on 12/24. PMH includes hepatic cirrhosis, gastric and splenic varices, sepsis.  Clinical Impression  Pt presents to PT with deficits in functional mobility, strength, balance, power, cognition, endurance. Pt demonstrates generalized weakness in all extremities with inconsistent MMT due to poor processing. Pt is able to stand and perform early gait training at the edge of bed, requiring support to maintain stability and verbal cueing for sequencing. Pt will benefit from frequent mobilization in an effort to improve mobility quality and to reduce falls risk. Pt reports independence prior to admission with use of RW. PT recommends a rehab consult at this time.        If plan is discharge home, recommend the following: A lot of help with walking and/or transfers;A lot of help with bathing/dressing/bathroom;Assistance with cooking/housework;Direct supervision/assist for medications management;Direct supervision/assist for financial management;Assist for transportation;Help with stairs or ramp for entrance   Can travel by private vehicle        Equipment Recommendations  (TBD pending progress)  Recommendations for Other Services  Rehab consult    Functional Status Assessment Patient has had a recent decline in their functional status and demonstrates the ability to make significant  improvements in function in a reasonable and predictable amount of time.     Precautions / Restrictions Precautions Precautions: Fall Restrictions Weight Bearing Restrictions Per Provider Order: No      Mobility  Bed Mobility Overal bed mobility: Needs Assistance Bed Mobility: Supine to Sit, Sit to Supine     Supine to sit: Min assist, HOB elevated Sit to supine: Min assist, HOB elevated        Transfers Overall transfer level: Needs assistance Equipment used: 1 person hand held assist Transfers: Sit to/from Stand Sit to Stand: Min assist                Ambulation/Gait Ambulation/Gait assistance: Min Chemical engineer (Feet): 3 Feet Assistive device: 2 person hand held assist Gait Pattern/deviations: Shuffle Gait velocity: reduced Gait velocity interpretation: <1.31 ft/sec, indicative of household ambulator   General Gait Details: pt shuffles to R side at edge of bed  Stairs            Wheelchair Mobility     Tilt Bed    Modified Rankin (Stroke Patients Only)       Balance Overall balance assessment: Needs assistance Sitting-balance support: No upper extremity supported, Feet supported Sitting balance-Leahy Scale: Good     Standing balance support: Bilateral upper extremity supported, Reliant on assistive device for balance Standing balance-Leahy Scale: Poor                               Pertinent Vitals/Pain Pain Assessment Pain Assessment: 0-10 Pain Score: 8  Pain Location: abdomen Pain Descriptors / Indicators: Grimacing Pain Intervention(s): Monitored during session    Home Living Family/patient expects to be discharged to:: Private residence Living Arrangements: Spouse/significant other;Children Available Help  at Discharge: Family;Available 24 hours/day Type of Home: House Home Access: Stairs to enter Entrance Stairs-Rails: None Entrance Stairs-Number of Steps: 10   Home Layout: One level Home Equipment:  Agricultural consultant (2 wheels)      Prior Function Prior Level of Function : Independent/Modified Independent;Driving             Mobility Comments: ambulatory with RW       Extremity/Trunk Assessment   Upper Extremity Assessment Upper Extremity Assessment: Generalized weakness;Difficult to assess due to impaired cognition (slowed processing and inconsistent following of commands for formal MMT)    Lower Extremity Assessment Lower Extremity Assessment: Generalized weakness (slowed processing and inconsistent following of commands for formal MMT)    Cervical / Trunk Assessment Cervical / Trunk Assessment: Kyphotic  Communication   Communication Communication: Difficulty communicating thoughts/reduced clarity of speech Cueing Techniques: Verbal cues  Cognition Arousal: Alert Behavior During Therapy: WFL for tasks assessed/performed Overall Cognitive Status: Impaired/Different from baseline Area of Impairment: Orientation, Problem solving, Awareness, Safety/judgement                 Orientation Level: Disoriented to, Situation (reports she is at the hospital due to her diabetes)       Safety/Judgement: Decreased awareness of safety, Decreased awareness of deficits Awareness: Intellectual Problem Solving: Slow processing, Requires verbal cues          General Comments General comments (skin integrity, edema, etc.): VSS on RA    Exercises     Assessment/Plan    PT Assessment Patient needs continued PT services  PT Problem List Decreased strength;Decreased activity tolerance;Decreased balance;Decreased mobility;Decreased knowledge of use of DME       PT Treatment Interventions DME instruction;Stair training;Gait training;Functional mobility training;Therapeutic activities;Therapeutic exercise;Balance training;Neuromuscular re-education;Cognitive remediation;Patient/family education    PT Goals (Current goals can be found in the Care Plan section)  Acute Rehab  PT Goals Patient Stated Goal: to return to independence PT Goal Formulation: With patient Time For Goal Achievement: 08/14/23 Potential to Achieve Goals: Fair    Frequency Min 1X/week     Co-evaluation               AM-PAC PT "6 Clicks" Mobility  Outcome Measure Help needed turning from your back to your side while in a flat bed without using bedrails?: A Little Help needed moving from lying on your back to sitting on the side of a flat bed without using bedrails?: A Little Help needed moving to and from a bed to a chair (including a wheelchair)?: A Little Help needed standing up from a chair using your arms (e.g., wheelchair or bedside chair)?: A Little Help needed to walk in hospital room?: Total Help needed climbing 3-5 steps with a railing? : Total 6 Click Score: 14    End of Session   Activity Tolerance: Patient tolerated treatment well Patient left: in bed;with call bell/phone within reach;with nursing/sitter in room Nurse Communication: Mobility status PT Visit Diagnosis: Other abnormalities of gait and mobility (R26.89);Muscle weakness (generalized) (M62.81)    Time: 6644-0347 PT Time Calculation (min) (ACUTE ONLY): 16 min   Charges:   PT Evaluation $PT Eval Moderate Complexity: 1 Mod   PT General Charges $$ ACUTE PT VISIT: 1 Visit         Arlyss Gandy, PT, DPT Acute Rehabilitation Office [phone]   Arlyss Gandy 07/31/2023, 1:03 PM

## 2023-07-31 NOTE — Progress Notes (Addendum)
   Please see my previous notes.  67yo F with newly diagnosed DM, admitted for sepsis and found to have bilateral ischemic infarcts, cirrhosis c/b gastric and splenic varices with upper and lower GI bleeding.  Patient's CBC showed a Hgb of 5.3 from 10.3 (24 hrs apart) with a lactic acid of 8.7 from  2.7 within 4 hrs.  Suspects this patient is bleeding from her varices. Blood pressure continues to be low at  94/59  with MAPS < 65 and now having hematemesis at bedside.     Latest Ref Rng & Units 07/31/2023    2:22 AM 07/30/2023    3:08 PM 07/30/2023   12:19 PM  CBC  WBC 4.0 - 10.5 K/uL 15.7   19.7   Hemoglobin 12.0 - 15.0 g/dL 5.3  9.9  40.9   Hematocrit 36.0 - 46.0 % 18.5  29.0  36.1   Platelets 150 - 400 K/uL 155   188         Latest Ref Rng & Units 07/31/2023    2:22 AM 07/30/2023    3:08 PM 07/30/2023   12:19 PM  CMP  Glucose 70 - 99 mg/dL 811   914   BUN 8 - 23 mg/dL 34   13   Creatinine 7.82 - 1.00 mg/dL 9.56   2.13   Sodium 086 - 145 mmol/L 133  137  131   Potassium 3.5 - 5.1 mmol/L 4.3  4.5  4.3   Chloride 98 - 111 mmol/L 105   103   CO2 22 - 32 mmol/L 15   18   Calcium 8.9 - 10.3 mg/dL 7.5   8.5   Total Protein 6.5 - 8.1 g/dL 3.9   6.0   Total Bilirubin <1.2 mg/dL 0.6   0.7   Alkaline Phos 38 - 126 U/L 35   59   AST 15 - 41 U/L 43   29   ALT 0 - 44 U/L 34   24      Will start blood infusion and also give octreotide at this time. Will continue to monitor vitals signs.Patient will benefit from ICU transfer and PCCM consult. Dr Ileene Patrick, MD consulted, plan is to scope in the morning  - Appreciated Rapid Response RN Mindy Hopper's assistance  - Appreciate GI recs  - EGD in the morning per Dr Adela Lank - Transfer to ICU for hemodynamic instability and airway protection - Page PCCM  Attempted to reach patient's family for updates but unable to reach them.  Kathleen Lime , MD Internal Medicine Resident, PGY-1 [facility]  Pager:  [phone] 4:35 AM, 07/31/2023   Please contact the on call pager after 5 pm and on weekends at [phone].

## 2023-07-31 NOTE — Evaluation (Signed)
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 No pertinent past medical history. Past Surgical History: History reviewed. No pertinent surgical history. HPI:  67 y.o. female presents to [facility] hospital on 07/30/2023 after a fall and shaking episode. 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 Additional punctate acute infarcts in right cerebellum, left temporal lobe, left occipital lobe, right occipital lobe, right parietal lobe, right frontal lobe, right thalamus, left frontal lobe. Pt with bloody bowel movement and hematemesis on 12/24. PMH includes hepatic cirrhosis, gastric and splenic varices, sepsis.   Assessment / Plan / Recommendation Clinical Impression  Pt is drowsy this morning and very easily distracted by her environment. This impacts her ability to attend to tasks and greatly impacts her ability to store new information. Even with many repetitions, she could not repeat five words back to SLP for immediate recall, which resulted in 0/5 words retrieved after brief delay (even though she had repeated up to 4 of the words immediately). She is oriented x2 with partial awareness to situation, but disoriented to time. Her speech is low in volume which makes her  hard to understand at times. It sounds like she was independent PTA. Only portions of the SLUMS were administered today (4 out of possible 14 points) in light of increasing lethargy and reduced attention. Pt will benefit from ongoing SLP follow with emphasis on differential diagnosis of abilities.    SLP Assessment  SLP Recommendation/Assessment: Patient needs continued Speech Lanaguage Pathology Services SLP Visit Diagnosis: Cognitive communication deficit (R41.841)    Recommendations for follow up therapy are one component of a multi-disciplinary discharge planning process, led by the attending physician.  Recommendations may be updated based on patient status, additional functional criteria and insurance authorization.    Follow Up Recommendations  Acute inpatient rehab (3hours/day)    Assistance Recommended at Discharge  Frequent or constant Supervision/Assistance  Functional Status Assessment Patient has had a recent decline in their functional status and demonstrates the ability to make significant improvements in function in a reasonable and predictable amount of time.  Frequency and Duration min 2x/week  2 weeks      SLP Evaluation Cognition  Overall Cognitive Status: Impaired/Different from baseline Arousal/Alertness: Lethargic Orientation Level: Oriented to person;Oriented to place;Disoriented to time;Disoriented to situation (partially oriented to situation; thought that it was March) Attention: Sustained Sustained Attention: Impaired Sustained Attention Impairment: Verbal basic Memory: Impaired Memory Impairment: Storage deficit;Retrieval deficit Awareness: Impaired Awareness Impairment: Intellectual impairment Problem Solving: Impaired Problem Solving Impairment: Verbal complex Safety/Judgment: Impaired       Comprehension  Auditory Comprehension Overall Auditory Comprehension: Impaired Commands: Impaired One Step Basic Commands: 75-100% accurate Conversation:  Simple Interfering Components: Attention    Expression Expression Primary Mode of Expression: Verbal Verbal Expression Overall Verbal Expression: Impaired Initiation: No impairment Automatic Speech: Name;Social Response Naming: Impairment Divergent:  (11  words in one minute plus multiple repetitions of words) Pragmatics: Impairment Impairments: Eye contact Interfering Components: Attention   Oral / Motor  Motor Speech Overall Motor Speech: Impaired Phonation: Low vocal intensity Intelligibility: Intelligibility reduced Phrase: 50-74% accurate            Mahala Menghini., M.A. CCC-SLP Acute Rehabilitation Services Office [phone]  Secure chat preferred  07/31/2023, 10:27 AM

## 2023-07-31 NOTE — Anesthesia Procedure Notes (Signed)
Procedure Name: Intubation Date/Time: 07/31/2023 2:44 PM  Performed by: Orlin Hilding, CRNAPre-anesthesia Checklist: Patient identified, Emergency Drugs available, Suction available, Patient being monitored and Timeout performed Patient Re-evaluated:Patient Re-evaluated prior to induction Oxygen Delivery Method: Circle system utilized Preoxygenation: Pre-oxygenation with 100% oxygen Induction Type: IV induction Ventilation: Mask ventilation without difficulty Laryngoscope Size: Mac and 3 Grade View: Grade I Tube type: Oral Tube size: 7.0 mm Number of attempts: 1 Placement Confirmation: ETT inserted through vocal cords under direct vision, positive ETCO2 and breath sounds checked- equal and bilateral Secured at: 22 cm Tube secured with: Tape Dental Injury: Teeth and Oropharynx as per pre-operative assessment

## 2023-07-31 NOTE — Progress Notes (Signed)
STROKE TEAM PROGRESS NOTE   SUBJECTIVE (INTERVAL HISTORY) No family is at the bedside.  Overall her condition is stable. Pt had severe anemia s/p PRBC. GI on board, had EGD showed gastric varices. Recommend IR on board to consider TIPS. Pt drowsy and sleepy on exam.    OBJECTIVE Temp:  [97.5 F (36.4 C)-98.6 F (37 C)] 98.5 F (36.9 C) (12/24 1517) Pulse Rate:  [87-124] 91 (12/24 1745) Cardiac Rhythm: Supraventricular tachycardia (12/24 0943) Resp:  [10-27] 12 (12/24 1745) BP: (57-142)/(41-123) 95/49 (12/24 1730) SpO2:  [92 %-100 %] 94 % (12/24 1745)  Recent Labs  Lab 07/31/23 1115 07/31/23 1236 07/31/23 1401 07/31/23 1427 07/31/23 1531  GLUCAP 168* 137* 151* 136* 153*   Recent Labs  Lab 07/30/23 1219 07/30/23 1508 07/31/23 0222 07/31/23 0935 07/31/23 1355  NA 131* 137 133*  --  139  K 4.3 4.5 4.3  --  3.7  CL 103  --  105  --  110  CO2 18*  --  15*  --  20*  GLUCOSE 433*  --  472*  --  153*  BUN 13  --  34*  --  25*  CREATININE 0.74  --  0.77  --  0.58  CALCIUM 8.5*  --  7.5*  --  7.8*  MG 1.8  --   --  1.7  --   PHOS  --   --   --  3.5  --    Recent Labs  Lab 07/30/23 1219 07/31/23 0222  AST 29 43*  ALT 24 34  ALKPHOS 59 35*  BILITOT 0.7 0.6  PROT 6.0* 3.9*  ALBUMIN 3.2* 2.2*   Recent Labs  Lab 07/30/23 1219 07/30/23 1508 07/31/23 0222 07/31/23 0935 07/31/23 1355  WBC 19.7*  --  15.7* 16.7*  --   NEUTROABS  --   --  10.7*  --   --   HGB 10.3* 9.9* 5.3* 9.1* 8.4*  HCT 36.1 29.0* 18.5* 28.4* 25.6*  MCV 66.6*  --  66.1* 73.6*  --   PLT 188  --  155 135*  --    Recent Labs  Lab 07/30/23 1444  CKTOTAL 69   Recent Labs    07/31/23 0222  LABPROT 16.9*  INR 1.4*   Recent Labs    07/30/23 1219 07/31/23 0636  COLORURINE YELLOW YELLOW  LABSPEC 1.031* 1.025  PHURINE 5.0 5.0  GLUCOSEU >=500* >=500*  HGBUR NEGATIVE LARGE*  BILIRUBINUR NEGATIVE NEGATIVE  KETONESUR NEGATIVE 20*  PROTEINUR 100* NEGATIVE  NITRITE NEGATIVE NEGATIVE   LEUKOCYTESUR NEGATIVE NEGATIVE       Component Value Date/Time   CHOL 104 07/31/2023 0222   TRIG 89 07/31/2023 0222   HDL 23 (L) 07/31/2023 0222   CHOLHDL 4.5 07/31/2023 0222   VLDL 18 07/31/2023 0222   LDLCALC 63 07/31/2023 0222   Lab Results  Component Value Date   HGBA1C 11.4 (H) 07/30/2023   No results found for: "LABOPIA", "COCAINSCRNUR", "LABBENZ", "AMPHETMU", "THCU", "LABBARB"  No results for input(s): "ETH" in the last 168 hours.  I have personally reviewed the radiological images below and agree with the radiology interpretations.  ECHOCARDIOGRAM COMPLETE BUBBLE STUDY Result Date: 07/31/2023    ECHOCARDIOGRAM REPORT   Patient Name:   Maria Rose Date of Exam: 07/31/2023 Medical Rec #:  [id number]       Height:       66.0 in Accession #:    [id number]      Weight:  180.0 lb Date of Birth:  [birth year]        BSA:          1.912 m Patient Age:    67 years        BP:           124/51 mmHg Patient Gender: F               HR:           105 bpm. Exam Location:  Inpatient Procedure: 2D Echo, Cardiac Doppler, Color Doppler and Saline Contrast Bubble            Study Indications:    Stroke  History:        Patient has no prior history of Echocardiogram examinations.                 Risk Factors:Current Smoker.  Sonographer:    Karma Ganja Referring Phys: [id number] JEFFREY C HATCHER  Sonographer Comments: Technically challenging study due to limited acoustic windows. Image acquisition challenging due to patient body habitus and Image acquisition challenging due to uncooperative patient. IMPRESSIONS  1. Left ventricular ejection fraction, by estimation, is 60 to 65%. The left ventricle has normal function. The left ventricle has no regional wall motion abnormalities. Left ventricular diastolic parameters are consistent with Grade I diastolic dysfunction (impaired relaxation).  2. Right ventricular systolic function is normal. The right ventricular size is normal.  3. Suboptimal bubble study-  no shunting seen.  4. The mitral valve is degenerative. No evidence of mitral valve regurgitation.  5. The aortic valve was not well visualized. Aortic valve regurgitation is not visualized. No aortic stenosis is present. Comparison(s): No prior Echocardiogram. FINDINGS  Left Ventricle: Left ventricular ejection fraction, by estimation, is 60 to 65%. The left ventricle has normal function. The left ventricle has no regional wall motion abnormalities. The left ventricular internal cavity size was normal in size. There is  no left ventricular hypertrophy. Left ventricular diastolic parameters are consistent with Grade I diastolic dysfunction (impaired relaxation). Right Ventricle: The right ventricular size is normal. No increase in right ventricular wall thickness. Right ventricular systolic function is normal. Left Atrium: Left atrial size was normal in size. Right Atrium: Right atrial size was normal in size. Pericardium: There is no evidence of pericardial effusion. Presence of epicardial fat layer. Mitral Valve: The mitral valve is degenerative in appearance. Mild mitral annular calcification. No evidence of mitral valve regurgitation. MV peak gradient, 4.0 mmHg. The mean mitral valve gradient is 2.0 mmHg. Tricuspid Valve: The tricuspid valve is grossly normal. Tricuspid valve regurgitation is not demonstrated. Aortic Valve: The aortic valve was not well visualized. Aortic valve regurgitation is not visualized. No aortic stenosis is present. Aortic valve mean gradient measures 5.0 mmHg. Aortic valve peak gradient measures 9.9 mmHg. Aortic valve area, by VTI measures 2.26 cm. Pulmonic Valve: The pulmonic valve was not well visualized. Pulmonic valve regurgitation is not visualized. No evidence of pulmonic stenosis. Aorta: The aortic root is normal in size and structure. IAS/Shunts: The interatrial septum was not well visualized. Agitated saline contrast was given intravenously to evaluate for intracardiac  shunting.  LEFT VENTRICLE PLAX 2D LVIDd:         5.00 cm   Diastology LVIDs:         3.30 cm   LV e' medial:    7.77 cm/s LV PW:         1.10 cm   LV E/e' medial:  10.4  LV IVS:        1.10 cm   LV e' lateral:   6.53 cm/s LVOT diam:     2.00 cm   LV E/e' lateral: 12.3 LV SV:         53 LV SV Index:   28 LVOT Area:     3.14 cm  RIGHT VENTRICLE RV S prime:     15.90 cm/s TAPSE (M-mode): 2.7 cm LEFT ATRIUM             Index LA diam:        4.30 cm 2.25 cm/m LA Vol (A2C):   37.1 ml 19.40 ml/m LA Vol (A4C):   53.2 ml 27.82 ml/m LA Biplane Vol: 44.0 ml 23.01 ml/m  AORTIC VALVE AV Area (Vmax):    2.28 cm AV Area (Vmean):   2.13 cm AV Area (VTI):     2.26 cm AV Vmax:           157.00 cm/s AV Vmean:          103.000 cm/s AV VTI:            0.234 m AV Peak Grad:      9.9 mmHg AV Mean Grad:      5.0 mmHg LVOT Vmax:         114.00 cm/s LVOT Vmean:        69.800 cm/s LVOT VTI:          0.168 m LVOT/AV VTI ratio: 0.72  AORTA Ao Root diam: 3.40 cm MITRAL VALVE MV Area (PHT): 4.21 cm     SHUNTS MV Area VTI:   2.96 cm     Systemic VTI:  0.17 m MV Peak grad:  4.0 mmHg     Systemic Diam: 2.00 cm MV Mean grad:  2.0 mmHg MV Vmax:       1.00 m/s MV Vmean:      70.0 cm/s MV Decel Time: 180 msec MV E velocity: 80.60 cm/s MV A velocity: 113.00 cm/s MV E/A ratio:  0.71 Riley Lam MD Electronically signed by Riley Lam MD Signature Date/Time: 07/31/2023/2:39:51 PM    Final    CT ANGIO HEAD NECK W WO CM Result Date: 07/31/2023 CLINICAL DATA:  Stroke/TIA, determine embolic source. EXAM: CT ANGIOGRAPHY HEAD AND NECK WITH AND WITHOUT CONTRAST TECHNIQUE: Multidetector CT imaging of the head and neck was performed using the standard protocol during bolus administration of intravenous contrast. Multiplanar CT image reconstructions and MIPs were obtained to evaluate the vascular anatomy. Carotid stenosis measurements (when applicable) are obtained utilizing NASCET criteria, using the distal internal carotid diameter  as the denominator. RADIATION DOSE REDUCTION: This exam was performed according to the departmental dose-optimization program which includes automated exposure control, adjustment of the mA and/or kV according to patient size and/or use of iterative reconstruction technique. CONTRAST:  75mL OMNIPAQUE IOHEXOL 350 MG/ML SOLN COMPARISON:  Head CT and MRI 07/30/2023 FINDINGS: CT HEAD FINDINGS Brain: Progressively well-defined cytotoxic edema is present at the junction of the right temporal, occipital, and parietal lobes corresponding to the known acute/early subacute infarct and extending over an area of 4.5-5 cm in diameter, possibly with trace associated petechial hemorrhage. The additional scattered punctate acute cerebral and cerebellar infarcts on MRI are occult by CT. No space-occupying parenchymal hematoma, mass, midline shift, or extra-axial fluid collection is identified. There is a background of mild chronic small vessel ischemia in the cerebral white matter. The ventricles are normal in size. Vascular: Calcified atherosclerosis at the  skull base. Skull: No acute fracture or suspicious osseous lesion. Sinuses/Orbits: Paranasal sinuses and mastoid air cells are clear. Unremarkable orbits. Other: None. Review of the MIP images confirms the above findings CTA NECK FINDINGS Aortic arch: Standard branching with a moderate amount of mixed soft and calcified atherosclerotic plaque. No significant stenosis of the arch vessel origins. Right carotid system: Patent with predominantly calcified plaque at the carotid bifurcation. No definite evidence of a significant stenosis allowing for streak artifact. Left carotid system: Patent with mixed calcified and soft plaque at the carotid bifurcation. No evidence of a significant stenosis. Vertebral arteries: Patent without evidence of a significant stenosis or dissection. Skeleton: Moderate cervical spondylosis. Other neck: No evidence of cervical lymphadenopathy or mass.  Upper chest: More fully evaluated on yesterday's chest CT. Review of the MIP images confirms the above findings CTA HEAD FINDINGS Anterior circulation: The internal carotid arteries are patent from skull base to carotid termini with calcified plaque resulting in mild left cavernous and bilateral paraclinoid stenoses. ACAs and MCAs are patent without evidence of a significant proximal stenosis. There is a mildly decreased number of distal right MCA branch vessels in the region of the dominant recent infarct, however no proximal branch occlusion is evident. No aneurysm is identified. Posterior circulation: The intracranial vertebral arteries are patent to the basilar with the left being mildly dominant. There is mild atherosclerotic irregularity of both V4 segments without significant stenosis. Patent left PICA, right AICA, and bilateral SCA origins are visualized. The basilar artery is widely patent. Posterior communicating arteries are diminutive or absent. Both PCAs are patent without evidence of a significant proximal stenosis. No aneurysm is identified. Venous sinuses: Patent. Anatomic variants: None. Review of the MIP images confirms the above findings IMPRESSION: 1. No large vessel occlusion. 2. Intracranial atherosclerosis with mild bilateral ICA stenoses. 3. Cervical carotid atherosclerosis without significant stenosis. 4.  Aortic Atherosclerosis (ICD10-I70.0). Electronically Signed   By: Sebastian Ache M.D.   On: 07/31/2023 11:29   EEG adult Result Date: 07/31/2023 Charlsie Quest, MD     07/31/2023 10:18 AM Patient Name: Maria Rose MRN: [id number] Epilepsy Attending: Charlsie Quest Referring Physician/Provider: Milon Dikes, MD Date: 07/31/2023 Duration: 28.18 mins Patient history: 67yo F with an episode of altered mental status getting eeg to evaluate for seizure Level of alertness: Awake, asleep AEDs during EEG study: None Technical aspects: This EEG study was done with scalp electrodes  positioned according to the 10-20 International system of electrode placement. Electrical activity was reviewed with band pass filter of 1-70Hz , sensitivity of 7 uV/mm, display speed of 75mm/sec with a 60Hz  notched filter applied as appropriate. EEG data were recorded continuously and digitally stored.  Video monitoring was available and reviewed as appropriate. Description: The posterior dominant rhythm consists of 9 Hz activity of moderate voltage (25-35 uV) seen predominantly in posterior head regions, asymmetric ( right<left) and reactive to eye opening and eye closing. Sleep was characterized by vertex waves, sleep spindles (12 to 14 Hz), maximal frontocentral region.  EEG showed continuous 3-5hz  theta- delta slowing in right posterior quadrant.  Intermittent generalized 3-5Hz  theta- delta slowing was also noted.  Photic driving was not seen during photic stimulation.  Hyperventilation was not not performed. ABNORMALITY -Continuous slow, right posterior quadrant -Intermittent slow, generalized IMPRESSION: This study is suggestive of cortical dysfunction in right posterior quadrant likely secondary to underlying stroke.  Additionally there is mild diffuse encephalopathy.  No seizures or epileptiform discharges were seen throughout the recording. Priyanka O  Melynda Ripple   MR Brain W and Wo Contrast Result Date: 07/30/2023 CLINICAL DATA:  New onset headache EXAM: MRI HEAD WITHOUT AND WITH CONTRAST TECHNIQUE: Multiplanar, multiecho pulse sequences of the brain and surrounding structures were obtained without and with intravenous contrast. CONTRAST:  8mL GADAVIST GADOBUTROL 1 MMOL/ML IV SOLN COMPARISON:  No prior MRI available, correlation is made with CT head 07/30/2023 FINDINGS: Brain: Restricted diffusion with ADC correlate in the right posterior temporal and lateral occipital and parietal cortex (series 3, images 22-33), which is associated with increased T2 hyperintense signal, gyral swelling, and hemosiderin  deposition, likely petechial hemorrhage. Additional punctate foci of restricted diffusion with ADC correlate in the right cerebellum (series 3, image 8), left temporal lobe (series 3, image 19), left occipital lobe (series 3, image 22), right occipital lobe (series 3, images 23), right parietal lobe (series 3, images 29-41), right frontal lobe (series 3, image 28 and 30), right thalamus (series 3, image 29), and left frontal lobe (series 3, images 25 and 33-34); these foci are both in the white matter and cortex. No abnormal parenchymal or meningeal enhancement. No mass, mass effect, or midline shift. No hydrocephalus or extra-axial collection. Pituitary and craniocervical junction within normal limits. Vascular: Normal arterial flow voids. Normal arterial and venous enhancement. Skull and upper cervical spine: Normal marrow signal. Sinuses/Orbits: Clear paranasal sinuses. No acute finding in the orbits. Other: The mastoid air cells are well aerated. IMPRESSION: 1. Acute to early subacute infarct in the right posterior temporal and lateral occipital and parietal cortex, with associated edema and petechial hemorrhage. 2. Additional punctate acute infarcts in the right cerebellum, left temporal lobe, left occipital lobe, right occipital lobe, right parietal lobe, right frontal lobe, right thalamus, and left frontal lobe. These foci are both in the white matter and cortex. The distribution is suggestive of a central embolic phenomenon. These results will be called to the ordering clinician or representative by the Radiologist Assistant, and communication documented in the PACS or [employer]. Electronically Signed   By: Wiliam Ke M.D.   On: 07/30/2023 22:55   CT CHEST ABDOMEN PELVIS W CONTRAST Result Date: 07/30/2023 CLINICAL DATA:  Trauma EXAM: CT CHEST, ABDOMEN, AND PELVIS WITH CONTRAST TECHNIQUE: Multidetector CT imaging of the chest, abdomen and pelvis was performed following the standard protocol  during bolus administration of intravenous contrast. RADIATION DOSE REDUCTION: This exam was performed according to the departmental dose-optimization program which includes automated exposure control, adjustment of the mA and/or kV according to patient size and/or use of iterative reconstruction technique. CONTRAST:  75mL OMNIPAQUE IOHEXOL 350 MG/ML SOLN COMPARISON:  None Available. FINDINGS: CT CHEST FINDINGS Cardiovascular: Severe aortic atherosclerosis. Normal heart size. No pericardial effusion. Mediastinum/Nodes: No enlarged mediastinal, hilar, or axillary lymph nodes. Thyroid gland, trachea, and esophagus demonstrate no significant findings. Lungs/Pleura: Mild centrilobular emphysema. Mild pulmonary fibrosis in a pattern with apical to basal gradient featuring irregular peripheral interstitial opacity and septal thickening with minimal traction bronchiectasis and minimal areas of subpleural bronchiolectasis of the lung bases. Small irregular nodule of the posterior left apex measuring 0.6 cm (series 6, image 69). No pleural effusion or pneumothorax. Musculoskeletal: No chest wall abnormality. No acute osseous findings. CT ABDOMEN PELVIS FINDINGS Hepatobiliary: No solid liver abnormality is seen. Coarse, nodular cirrhotic morphology of the liver. No gallstones, gallbladder wall thickening, or biliary dilatation. Pancreas: Unremarkable. No pancreatic ductal dilatation or surrounding inflammatory changes. Spleen: Normal in size without significant abnormality. Adrenals/Urinary Tract: Adrenal glands are unremarkable. Kidneys are normal, without renal  calculi, solid lesion, or hydronephrosis. Bladder is unremarkable. Stomach/Bowel: Stomach is within normal limits. Appendix appears normal. No evidence of bowel wall thickening, distention, or inflammatory changes. Sigmoid diverticulosis. Vascular/Lymphatic: Severe aortic atherosclerosis. 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 No  enlarged abdominal or pelvic lymph nodes. Reproductive: No mass or other abnormality. Other: No abdominal wall hernia or abnormality. No ascites. Musculoskeletal: No acute osseous findings. IMPRESSION: 1. No CT evidence of acute traumatic injury to the chest, abdomen, or pelvis. 2. Mild pulmonary fibrosis in a pattern with apical to basal gradient featuring irregular peripheral interstitial opacity and septal thickening with minimal traction bronchiectasis and minimal areas of subpleural bronchiolectasis of the lung bases. 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 3. Small irregular nodule of the posterior left apex measuring 0.6 cm. Non-contrast chest CT at 6-12 months is recommended. If the nodule is stable at time of repeat CT, then future CT at 18-24 months (from today's scan) is considered optional for low-risk patients, but is recommended for high-risk patients. This recommendation follows the consensus statement: Guidelines for Management of Incidental Pulmonary Nodules Detected on CT Images: From the Fleischner Society 2017; Radiology 2017; 284:228-243. 4. Cirrhosis. 5. 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 6. Sigmoid diverticulosis without evidence of acute diverticulitis. Aortic Atherosclerosis (ICD10-I70.0) and Emphysema (ICD10-J43.9). Electronically Signed   By: Jearld Lesch M.D.   On: 07/30/2023 14:18   CT HEAD WO CONTRAST Result Date: 07/30/2023 CLINICAL DATA:  Trauma EXAM: CT HEAD WITHOUT CONTRAST CT CERVICAL SPINE WITHOUT CONTRAST TECHNIQUE: Multidetector CT imaging of the head and cervical spine was performed following the standard protocol without intravenous contrast. Multiplanar CT image reconstructions of the cervical spine were also generated. RADIATION DOSE REDUCTION: This exam was performed according to the departmental dose-optimization program which includes automated exposure control, adjustment of the mA and/or kV according to patient size  and/or use of iterative reconstruction technique. COMPARISON:  None Available. FINDINGS: CT HEAD FINDINGS Brain: Cortical based hypodensity of the right parieto-occipital junction measuring 2.4 x 2.2 cm (series 11, image 25, series 9, image 24). No evidence of hemorrhage, hydrocephalus, extra-axial collection or mass effect. Vascular: No hyperdense vessel or unexpected calcification. Skull: Normal. Negative for fracture or focal lesion. Sinuses/Orbits: No acute finding. Other: Soft tissue contusion of the left scalp vertex (series 11, image 7) CT CERVICAL SPINE FINDINGS Alignment: Degenerative straightening of the normal cervical lordosis. Skull base and vertebrae: No acute fracture. No primary bone lesion or focal pathologic process. Soft tissues and spinal canal: No prevertebral fluid or swelling. No visible canal hematoma. Disc levels: Moderate disc space height loss and osteophytosis from C5-C7 Upper chest: Negative. Other: None. IMPRESSION: 1. Cortical based hypodensity of the right parieto-occipital junction measuring 2.4 x 2.2 cm. This is concerning for acute or subacute infarct or alternately underlying mass. Recommend MRI for further evaluation. 2. No evidence of hemorrhage or mass effect. 3. Soft tissue contusion of the left scalp vertex. 4. No fracture or static subluxation of the cervical spine. 5. Moderate disc degenerative disease from C5-C7. Electronically Signed   By: Jearld Lesch M.D.   On: 07/30/2023 14:13   CT CERVICAL SPINE WO CONTRAST Result Date: 07/30/2023 CLINICAL DATA:  Trauma EXAM: CT HEAD WITHOUT CONTRAST CT CERVICAL SPINE WITHOUT CONTRAST TECHNIQUE: Multidetector CT imaging of the head and cervical spine was performed following the standard protocol without intravenous contrast. Multiplanar CT image reconstructions of the cervical spine were also generated.  RADIATION DOSE REDUCTION: This exam was performed according to the departmental dose-optimization program which includes  automated exposure control, adjustment of the mA and/or kV according to patient size and/or use of iterative reconstruction technique. COMPARISON:  None Available. FINDINGS: CT HEAD FINDINGS Brain: Cortical based hypodensity of the right parieto-occipital junction measuring 2.4 x 2.2 cm (series 11, image 25, series 9, image 24). No evidence of hemorrhage, hydrocephalus, extra-axial collection or mass effect. Vascular: No hyperdense vessel or unexpected calcification. Skull: Normal. Negative for fracture or focal lesion. Sinuses/Orbits: No acute finding. Other: Soft tissue contusion of the left scalp vertex (series 11, image 7) CT CERVICAL SPINE FINDINGS Alignment: Degenerative straightening of the normal cervical lordosis. Skull base and vertebrae: No acute fracture. No primary bone lesion or focal pathologic process. Soft tissues and spinal canal: No prevertebral fluid or swelling. No visible canal hematoma. Disc levels: Moderate disc space height loss and osteophytosis from C5-C7 Upper chest: Negative. Other: None. IMPRESSION: 1. Cortical based hypodensity of the right parieto-occipital junction measuring 2.4 x 2.2 cm. This is concerning for acute or subacute infarct or alternately underlying mass. Recommend MRI for further evaluation. 2. No evidence of hemorrhage or mass effect. 3. Soft tissue contusion of the left scalp vertex. 4. No fracture or static subluxation of the cervical spine. 5. Moderate disc degenerative disease from C5-C7. Electronically Signed   By: Jearld Lesch M.D.   On: 07/30/2023 14:13     PHYSICAL EXAM  Temp:  [97.5 F (36.4 C)-98.6 F (37 C)] 98.5 F (36.9 C) (12/24 1517) Pulse Rate:  [87-124] 91 (12/24 1745) Resp:  [10-27] 12 (12/24 1745) BP: (57-142)/(41-123) 95/49 (12/24 1730) SpO2:  [92 %-100 %] 94 % (12/24 1745)  General - Well nourished, well developed, drowsy sleepy.  Ophthalmologic - fundi not visualized due to noncooperation.  Cardiovascular - Regular rhythm and  rate.  Neuro - drowsy sleepy, eyes open on voice but not able to maintain eye opening without repetitive stimulation, however orientated to age, place, and time. No aphasia, paucity of speech, but able to follow most simple commands. Able to name and repeat.  Right gaze preference, incomplete left gaze, tracking bilaterally, not blinking to visual threat on the left but inconsistent blinking to visual threat on the right, PERRL. No obvious facial droop. Tongue protrusion not corporative. RUE no drift and LUE slow drift, on presentation right lower extremity moving more than left lower extremity. Sensation, coordination and gait not tested.    ASSESSMENT/PLAN Ms. MATTI DIPAOLA is a 67 y.o. female with history of nasal cellulitis on Abx admitted for SOB, syncope, hyperglycemia, speech difficulty, AMS with elevated lactic acid. No tPA given due to outside window.    Stroke:  bilateral embolic shower, embolic pattern, etiology unclear, needs to rule out endocarditis CT right parietooccipital infarct CTA head and neck mild bilateral ICA stenoses.  MRI  Acute to early subacute infarct in the right posterior temporal and lateral occipital and parietal cortex, with associated edema and petechial hemorrhage. Additional punctate acute infarcts in the right cerebellum, left temporal lobe, left occipital lobe, right occipital lobe, right parietal lobe, right frontal lobe, right thalamus, and left frontal lobe. These foci are both in the white matter and cortex. 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 On octreotide, PPI IV and empiric Abx  Consulting IR for TIPS consideratoin  Severe anemia  S/p PRBC infusion Hb 10.3->5.3->PRBC->9.1->7.2->PRBC GI on board, EGD showed gastric and duodenum bleeding S/p hemostatic spray Close monitoring  Diabetes, new diagnosis HgbA1c 11.4 goal < 7.0 Currently on insulin drip CBG monitoring SSI DM education and close PCP follow up  Leukocytosis Elevated lactic acid WBC 19.7->15.7->16.7 Afebrile Elevated LA UA neg Blood culture pending Concerning for sepsis, on Rocephin, flagyl and vancomycin Needs TEE to rule out endocarditis  BP management BP Stable Avoid low BP Long term BP goal normotensive  Lipid management Home meds:  none  LDL 63, goal < 70 no statin needed given LDL at goal  Other Stroke Risk Factors Advanced age  Other Active Problems Mild thrombocytopenia - 155->135  Hospital day # 0  This patient is critically ill due to sepsis, stroke, GIB, anemia and at significant risk of neurological worsening, death form septic shock, severe GIB, anemia, recurrent stroke. This patient's care requires constant monitoring of vital signs, hemodynamics, respiratory and cardiac monitoring, review of multiple databases, neurological assessment, discussion with family, other specialists and medical decision making of high complexity. I spent 40 minutes of neurocritical care time in the care of this patient.   Marvel Plan, MD PhD Stroke Neurology 07/31/2023 6:39 PM    To contact Stroke Continuity provider, please refer to [email or web address]. After hours, contact General Neurology

## 2023-07-31 NOTE — Progress Notes (Signed)
PCCM:  Patient has varices. GI recommending speaking with IR.  I have placed ordered for CTA AP BTRO and Eval for IR consult.  Josephine Igo, DO [facility] 07/31/2023 4:02 PM

## 2023-07-31 NOTE — Progress Notes (Signed)
Inpatient Rehab Admissions Coordinator Note:   Per therapy recommendations patient was screened for CIR candidacy by Stephania Fragmin, PT. At this time, pt appears to be a potential candidate for CIR. I will place an order for rehab consult for full assessment, per our protocol.  Please contact me any with questions.Estill Dooms, PT, DPT [phone] 07/31/23 2:22 PM

## 2023-07-31 NOTE — Transfer of Care (Signed)
Immediate Anesthesia Transfer of Care Note  Patient: Maria Rose  Procedure(s) Performed: ESOPHAGOGASTRODUODENOSCOPY (EGD) WITH PROPOFOL HEMOSTASIS CONTROL  Patient Location: PACU and Endoscopy Unit  Anesthesia Type:General  Level of Consciousness: drowsy and patient cooperative  Airway & Oxygen Therapy: Patient Spontanous Breathing  Post-op Assessment: Report given to RN and Post -op Vital signs reviewed and stable  Post vital signs: Reviewed and stable  Last Vitals:  Vitals Value Taken Time  BP    Temp    Pulse 100 07/31/23 1519  Resp 18 07/31/23 1519  SpO2 96 % 07/31/23 1519  Vitals shown include unfiled device data.  Last Pain:  Vitals:   07/31/23 1416  TempSrc: Tympanic  PainSc: 0-No pain         Complications: No notable events documented.

## 2023-07-31 NOTE — Consult Note (Signed)
NAME:  Maria Rose, MRN:  [id number], DOB:  [birth year], LOS: 0 ADMISSION DATE:  07/30/2023, CONSULTATION DATE:  07/31/23 REFERRING MD:  Ninetta Lights CHIEF COMPLAINT:  GIB   History of Present Illness:  Maria Rose is a 67 y.o. female who has a PMH as below. She had not seen a healthcare provider in almost 10 years. On 12/23, she was admitted after a fall following a shaking episode. She had been having headaches as well as nausea prior to this. Per husband, she was initially unresponsive for about 10 seconds then after the fall she felt a bit disoriented and was answering questions with only one word.  She had CT head/C spine that demonstrated cortical hypodensity at the right parietooccipital junction measuring 2.4x2.2cm, no hemorrhage, DDD C5-C7. 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  MRI brain after admission demonstrated acute to early subacute infarct in the right posterior temporal and lateral occipital parietal cortex with associated edema and petechial hemorrhage. Additional punctate acute infarcts in right cerebellum, left temporal lobe, left occipital lobe, right occipital lobe, right parietal lobe, right frontal lobe, right thalamus, left frontal lobe.  Early AM 12/24 she had 2 episodes of large bloody bowel movements initially tarry mixed with a little bit of bright red and clots. BP dropped slightly to SBP 80s after 2nd large episode, repeat Hgb had dropped from 10.3 to 5.3 and lactic acid had climbed from 2.7 to 8.7. She later had small volume hematemesis. Due to ongoing bleed with new hematemesis, decision made to transfer to ICU and PCCM consulted for assistance. She was started on 2u PRBC and a 1L NS bolus. BP improved with SBP 120s. She is currently on 1 of 2u PRBC's.  She denies any prior GIB, any alcohol use, frequent NSAIDs, antiplatelets/anticoagulants. Has mild  abdominal pain only with deep palpation and not at baseline. Does have nausea and has not received any antiemetics yet.  Pertinent  Medical History:  has Sepsis (HCC); Syncope and collapse; Hyperglycemia; SIRS (systemic inflammatory response syndrome) (HCC); Hepatic cirrhosis (HCC); GI bleed; Gastric varices; and Splenic varices on their problem list.  Significant Hospital Events: Including procedures, antibiotic start and stop dates in addition to other pertinent events   12/23 admit. 12/24 transfer to ICU due to ongoing LGIB and hematemesis with drop in Hgb from 10.3 to 5.3  Interim History / Subjective:  Feels nauseous but otherwise no complaints. Getting 1st of 2u PRBC now. GI notified by IMTS and they plan to see her in the AM. Recommended continuing Octreotide  Objective:  Blood pressure (!) 111/47, pulse 100, temperature (!) 97.5 F (36.4 C), temperature source Oral, resp. rate 20, height 5\' 6"  (1.676 m), weight 81.6 kg, SpO2 98%.        Intake/Output Summary (Last 24 hours) at 07/31/2023 0421 Last data filed at 07/31/2023 4098 Gross per 24 hour  Intake 3914.8 ml  Output --  Net 3914.8 ml   Filed Weights   07/30/23 1132  Weight: 81.6 kg    Examination: General: Adult female, resting in bed, in NAD. Neuro: Awake, slightly confused but able to answer basic questions appropriately. HEENT: [place]/AT. Sclerae anicteric. EOMI. Minimal amount of blood in emesis bag, currently nauseous but no active vomiting. Cardiovascular: RRR, no M/R/G.  Lungs: Respirations even and unlabored.  CTA bilaterally, No W/R/R. Abdomen: Obese. BS x 4, soft, epigastric tenderness to deep palpation. Musculoskeletal: No gross deformities, no  edema.  Skin: Intact, warm, no rashes.   Labs/imaging personally reviewed:  CT head/CSpine 12/23 > cortical hypodensity at the right parietooccipital junction measuring 2.4x2.2cm, no hemorrhage, DDD C5-C7.  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 MRI brain 12/23 > acute to early subacute infarct in the right posterior temporal and lateral occipital parietal cortex with associated edema and petechial hemorrhage. Additional punctate acute infarcts in right cerebellum, left temporal lobe, left occipital lobe, right occipital lobe, right parietal lobe, right frontal lobe, right thalamus, left frontal lobe. CTA head/neck 12/24 >  Echo 12/24 > EEG 12/24 >   Assessment & Plan:   GIB - unclear etiology but with large volume tarry stools and new hematemesis, concern for gastric varices as source. ABLA - 2/2 above. - IMTS has consulted GI who will plan to see her in AM 12/24. - Continue Octreotide. - Continue PPI, received 80mg  IV bolus and will now order 40mg  q6hrs x 72 hours then transition to 40mg  q12hrs (in lieu of continuous infusion given IVF shortages). - Continue PRBC resuscitation, currently on 1st of 2u PRBC. - Maintain Hgb > 7. - NPO. - If has increased hematemesis, might require intubation. Ok for now with close monitoring. - Zofran.  Acute vs subacute CVA with multiple areas of punctate infarcts - concern for embolic source. - Neuro following. - No antiplatelets/AC for now. - F/u on echo, CTA head/neck, EEG. - PT/OT/SLP when able.  Concern for occult sepsis - unclear source thus far. - Continue empiric Vanc/Cefepime for now.  - Follow cultures.  Hyperglycemia with new DM (A1c 11.4) - concern for HHS given continued hyperglycemia with metabolic acidosis. BHBA negative. - Change from SSI and Semglee to Endotool per HHS/DKA protocol. Will avoid initial IVF bolus as already received 1L NS and receiving 2u PRBC now.  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 Small LUL nodule. - Once stabilized and over acute issues, send autoimmune panel. - Continue supplemental O2 as needed to maintain SpO2 > 92%. - Needs follow up CT chest in 6 months. -  Can have outpatient pulmonary follow up with Dr. Marchelle Gearing or Dr. Isaiah Serge.  Best practice (evaluated daily):  Diet/type: NPO DVT prophylaxis: SCD Pressure ulcer(s): pressure ulcer assessment deferred  GI prophylaxis: PPI Lines: N/A Foley:  N/A Code Status:  full code Last date of multidisciplinary goals of care discussion: None yet.  Labs   CBC: Recent Labs  Lab 07/30/23 1219 07/30/23 1508 07/31/23 0222  WBC 19.7*  --  15.7*  NEUTROABS  --   --  10.7*  HGB 10.3* 9.9* 5.3*  HCT 36.1 29.0* 18.5*  MCV 66.6*  --  66.1*  PLT 188  --  155    Basic Metabolic Panel: Recent Labs  Lab 07/30/23 1219 07/30/23 1508 07/31/23 0222  NA 131* 137 133*  K 4.3 4.5 4.3  CL 103  --  105  CO2 18*  --  15*  GLUCOSE 433*  --  472*  BUN 13  --  34*  CREATININE 0.74  --  0.77  CALCIUM 8.5*  --  7.5*  MG 1.8  --   --    GFR: Estimated Creatinine Clearance: 72.5 mL/min (by C-G formula based on SCr of 0.77 mg/dL). Recent Labs  Lab 07/30/23 1219 07/30/23 1444 07/30/23 2043 07/31/23 0222  WBC 19.7*  --   --  15.7*  LATICACIDVEN  --  3.8* 2.7* 8.4*  Liver Function Tests: Recent Labs  Lab 07/30/23 1219 07/31/23 0222  AST 29 43*  ALT 24 34  ALKPHOS 59 35*  BILITOT 0.7 0.6  PROT 6.0* 3.9*  ALBUMIN 3.2* 2.2*   No results for input(s): "LIPASE", "AMYLASE" in the last 168 hours. No results for input(s): "AMMONIA" in the last 168 hours.  ABG    Component Value Date/Time   HCO3 20.3 07/30/2023 1508   TCO2 21 (L) 07/30/2023 1508   ACIDBASEDEF 5.0 (H) 07/30/2023 1508   O2SAT 90 07/30/2023 1508     Coagulation Profile: Recent Labs  Lab 07/31/23 0222  INR 1.4*    Cardiac Enzymes: Recent Labs  Lab 07/30/23 1444  CKTOTAL 69    HbA1C: Hgb A1c MFr Bld  Date/Time Value Ref Range Status  07/30/2023 02:46 PM 11.4 (H) 4.8 - 5.6 % Final    Comment:    (NOTE) Pre diabetes:          5.7%-6.4%  Diabetes:              >6.4%  Glycemic control for   <7.0% adults with  diabetes     CBG: Recent Labs  Lab 07/30/23 1126 07/30/23 1237 07/30/23 1751 07/30/23 2231 07/31/23 0411  GLUCAP 405* 356* 239* 291* 357*    Review of Systems:   All negative; except for those that are bolded, which indicate positives.  Constitutional: weight loss, weight gain, night sweats, fevers, chills, fatigue, weakness.  HEENT: headaches, sore throat, sneezing, nasal congestion, post nasal drip, difficulty swallowing, tooth/dental problems, visual complaints, visual changes, ear aches. Neuro: difficulty with speech, weakness, numbness, ataxia. CV:  chest pain, orthopnea, PND, swelling in lower extremities, dizziness, palpitations, syncope.  Resp: cough, hemoptysis, dyspnea, wheezing. GI: heartburn, indigestion, abdominal pain, nausea, vomiting, diarrhea, constipation, change in bowel habits, loss of appetite, hematemesis, melena, hematochezia.  GU: dysuria, change in color of urine, urgency or frequency, flank pain, hematuria. MSK: joint pain or swelling, decreased range of motion. Psych: change in mood or affect, depression, anxiety, suicidal ideations, homicidal ideations. Skin: rash, itching, bruising.   Past Medical History:  She,  has no past medical history on file.   Surgical History:  History reviewed. No pertinent surgical history.   Social History:   reports that she has been smoking. She has never used smokeless tobacco. She reports current alcohol use.   Family History:  Her family history is not on file.   Allergies No Known Allergies   Home Medications  Prior to Admission medications   Medication Sig Start Date End Date Taking? Authorizing Provider  clotrimazole (LOTRIMIN) 1 % cream Apply to affected area 2 times daily 05/16/23  Yes Renne Crigler, PA-C  erythromycin ophthalmic ointment Place 1 Application into the left eye 4 (four) times daily. 06/03/23  Yes [provider]  mupirocin ointment (BACTROBAN) 2 % Apply 1 Application topically  daily. 06/26/23  Yes [provider]  cephALEXin (KEFLEX) 500 MG capsule Take 1 capsule (500 mg total) by mouth 4 (four) times daily. Patient not taking: Reported on 07/30/2023 05/16/23   Renne Crigler, PA-C  fluconazole (DIFLUCAN) 150 MG tablet Take 1 tablet (150 mg total) by mouth daily. Patient not taking: Reported on 07/30/2023 05/16/23   Renne Crigler, PA-C     Critical care time: 50 min.   Rutherford Guys, PA - [facility] For pager details, please see AMION or use Epic chat  After 1900, please call [facility] for cross coverage needs 07/31/2023,  4:21 AM

## 2023-07-31 NOTE — Consult Note (Signed)
Consultation  Referring Provider:   CCM Primary Care Physician:  Pcp, No Primary Gastroenterologist:  Gentry Fitz       Reason for Consultation:    Melena and hematemesis with acute blood loss anemia DOA: 07/30/2023         Hospital Day: 2         HPI:   Maria Rose is a 67 y.o. female with past medical history significant for fatty liver, diabetes, nasal cellulitis, obesity.   Presents to the ER with syncopal episode. Plan to be tachycardic, afebrile normotensive.   Lactate 3.8 WBC 19.7 glucose 400s started on vancomycin/cefepime/Flagyl for possible sepsis A1c 11.8 Blood cultures negative 12 hours, HIV negative BUN 13, creatinine 0.74, sodium 131, CO2 18 Hgb 10.3 with microcytosis, platelets 188 AST 29, ALT 24, alk phos 59, total bilirubin 0.79 INR 1.4 Urine negative for ketones, glucose over 500 protein 100 rare bacteria no leukocytes or nitrates. Troponin 89--> 8.6 stable, CPK 69 CT chest abdomen pelvis with contrast coarse nodular cirrhotic liver unremarkable gallbladder no biliary dilation unremarkable pancreas spleen normal size unremarkable bowel open sigmoid diverticulosis.  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  No family was present at the time of my evaluation. Elevated liver functions 2016 with [facility] alk phos 107 AST 64 ALT 106 had  negative at hepatitis panel at that time no further workup. Patient states she has never seen GI doctor never had EGD:, Father with pancreatic cancer, no family history of liver disease. Denies history of alcohol use did have 34-year year smoking history but quit in January denies any drug use. Patient normally has bowel movement every other day denies any history of melena or hematochezia.  Denies any history of jaundice, leg swelling, abdominal swelling, confusion of then after her syncopal episode that brought her to the ER. Denies GERD, dysphagia.  Has had some nausea in the past week.  She has had decreased appetite with weight loss in the last 2 to 3 months.  She has had some chills in the last few days but denies fever.  Patient is also had headache and weakness within the last week leading up to the syncopal episode. Patient is on Aleve 1 to 2 pills daily for years for knee arthritis is not on PPI. Since being here patient had melena with hematemesis and I was just notified she had another episode of bright red/dark bowel movement. Hemoglobin 5.3 has had 2 PRBCs pending stat H&H repeat. Tachycardic in the room, blood pressure stable she is not on pressors.  Abnormal ED labs: Abnormal Labs Reviewed  BASIC METABOLIC PANEL - Abnormal; Notable for the following components:      Result Value   Sodium 131 (*)    CO2 18 (*)    Glucose, Bld 433 (*)    Calcium 8.5 (*)    All other components within normal limits  CBC - Abnormal; Notable for the following components:  WBC 19.7 (*)    RBC 5.42 (*)    Hemoglobin 10.3 (*)    MCV 66.6 (*)    MCH 19.0 (*)    MCHC 28.5 (*)    RDW 22.3 (*)    All other components within normal limits  URINALYSIS, ROUTINE W REFLEX MICROSCOPIC - Abnormal; Notable for the following components:   APPearance HAZY (*)    Specific Gravity, Urine 1.031 (*)    Glucose, UA >=500 (*)    Protein, ur 100 (*)    Bacteria, UA RARE (*)    All other components within normal  limits  HEPATIC FUNCTION PANEL - Abnormal; Notable for the following components:   Total Protein 6.0 (*)    Albumin 3.2 (*)    All other components within normal limits  HEMOGLOBIN A1C - Abnormal; Notable for the following components:   Hgb A1c MFr Bld 11.4 (*)    All other components within normal limits  OSMOLALITY - Abnormal; Notable for the following components:   Osmolality 312 (*)    All other components within normal limits  LACTIC ACID, PLASMA - Abnormal; Notable for the following components:   Lactic Acid, Venous 2.7 (*)    All other components within normal limits  LACTIC ACID, PLASMA - Abnormal; Notable for the following components:   Lactic Acid, Venous 3.8 (*)    All other components within normal limits  GLUCOSE, CAPILLARY - Abnormal; Notable for the following components:   Glucose-Capillary 239 (*)    All other components within normal limits  LIPID PANEL - Abnormal; Notable for the following components:   HDL 23 (*)    All other components within normal limits  GLUCOSE, CAPILLARY - Abnormal; Notable for the following components:   Glucose-Capillary 291 (*)    All other components within normal limits  LACTIC ACID, PLASMA - Abnormal; Notable for the following components:   Lactic Acid, Venous 8.4 (*)    All other components within normal limits  CBC WITH DIFFERENTIAL/PLATELET - Abnormal; Notable for the following components:   WBC 15.7 (*)    RBC 2.80 (*)    Hemoglobin 5.3 (*)    HCT 18.5 (*)    MCV 66.1 (*)    MCH 18.9 (*)    MCHC 28.6 (*)    RDW 22.1 (*)    Neutro Abs 10.7 (*)    Abs Immature Granulocytes 0.14 (*)    All other components within normal limits  PROTIME-INR - Abnormal; Notable for the following components:   Prothrombin Time 16.9 (*)    INR 1.4 (*)    All other components within normal limits  COMPREHENSIVE METABOLIC PANEL - Abnormal; Notable for the following components:   Sodium 133 (*)    CO2 15 (*)    Glucose, Bld 472 (*)    BUN 34  (*)    Calcium 7.5 (*)    Total Protein 3.9 (*)    Albumin 2.2 (*)    AST 43 (*)    Alkaline Phosphatase 35 (*)    All other components within normal limits  GLUCOSE, CAPILLARY - Abnormal; Notable for the following components:   Glucose-Capillary 357 (*)    All other components within normal limits  GLUCOSE, CAPILLARY - Abnormal; Notable for the following components:   Glucose-Capillary 342 (*)    All other components within normal limits  URINALYSIS, ROUTINE W REFLEX MICROSCOPIC - Abnormal; Notable for the following components:   Glucose, UA >=500 (*)    Hgb  urine dipstick LARGE (*)    Ketones, ur 20 (*)    All other components within normal limits  GLUCOSE, CAPILLARY - Abnormal; Notable for the following components:   Glucose-Capillary 320 (*)    All other components within normal limits  GLUCOSE, CAPILLARY - Abnormal; Notable for the following components:   Glucose-Capillary 240 (*)    All other components within normal limits  CBG MONITORING, ED - Abnormal; Notable for the following components:   Glucose-Capillary 405 (*)    All other components within normal limits  CBG MONITORING, ED - Abnormal; Notable for the following components:   Glucose-Capillary 356 (*)    All other components within normal limits  I-STAT VENOUS BLOOD GAS, ED - Abnormal; Notable for the following components:   pCO2, Ven 35.3 (*)    pO2, Ven 60 (*)    TCO2 21 (*)    Acid-base deficit 5.0 (*)    Calcium, Ion 1.04 (*)    HCT 29.0 (*)    Hemoglobin 9.9 (*)    All other components within normal limits  TROPONIN I (HIGH SENSITIVITY) - Abnormal; Notable for the following components:   Troponin I (High Sensitivity) 89 (*)    All other components within normal limits  TROPONIN I (HIGH SENSITIVITY) - Abnormal; Notable for the following components:   Troponin I (High Sensitivity) 86 (*)    All other components within normal limits    History reviewed. No pertinent past medical history.  Surgical  History:  She  has no past surgical history on file. Family History:  Her family history is not on file. Social History:   reports that she has been smoking. She has never used smokeless tobacco. She reports current alcohol use. No history on file for drug use.  Prior to Admission medications   Medication Sig Start Date End Date Taking? Authorizing Provider  clotrimazole (LOTRIMIN) 1 % cream Apply to affected area 2 times daily 05/16/23  Yes Renne Crigler, PA-C  erythromycin ophthalmic ointment Place 1 Application into the left eye 4 (four) times daily. 06/03/23  Yes [provider]  mupirocin ointment (BACTROBAN) 2 % Apply 1 Application topically daily. 06/26/23  Yes [provider]    Current Facility-Administered Medications  Medication Dose Route Frequency Provider Last Rate Last Admin   [START ON 08/01/2023]  stroke: early stages of recovery book   Does not apply Once Milon Dikes, MD       cefTRIAXone (ROCEPHIN) 2 g in sodium chloride 0.9 % 100 mL IVPB  2 g Intravenous Q24H Patrici Ranks, MD 200 mL/hr at 07/31/23 0751 2 g at 07/31/23 0751   Chlorhexidine Gluconate Cloth 2 % PADS 6 each  6 each Topical Daily Desai, Rahul P, PA-C   6 each at 07/31/23 0426   dextrose 5 % in lactated ringers infusion   Intravenous Continuous Rutherford Guys P, PA-C 125 mL/hr at 07/31/23 0748 New Bag at 07/31/23 0748   dextrose 50 % solution 0-50 mL  0-50 mL Intravenous PRN Celine Mans, Rahul P, PA-C       insulin regular, human (MYXREDLIN) 100 units/ 100 mL infusion   Intravenous Continuous Desai, Rahul P, PA-C 10 mL/hr at 07/31/23 0731 10 Units/hr at 07/31/23 0731   lactated ringers infusion   Intravenous Continuous Desai, Rahul P, PA-C 125 mL/hr at 07/31/23 0600 Infusion Verify at 07/31/23 0600   octreotide (SANDOSTATIN) 500 mcg in sodium chloride 0.9 % 250 mL (2 mcg/mL) infusion  50 mcg/hr Intravenous Continuous Gomez-Caraballo,  Byrd Hesselbach, MD 25 mL/hr at 07/31/23 0600 50 mcg/hr at 07/31/23 0600    ondansetron (ZOFRAN) injection 4 mg  4 mg Intravenous Q6H PRN Celine Mans, Rahul P, PA-C   4 mg at 07/31/23 0425   Oral care mouth rinse  15 mL Mouth Rinse PRN Desai, Rahul P, PA-C       pantoprazole (PROTONIX) injection 40 mg  40 mg Intravenous Q6H Desai, Rahul P, PA-C       Followed by   Melene Muller ON 08/03/2023] pantoprazole (PROTONIX) injection 40 mg  40 mg Intravenous Q12H Desai, Rahul P, PA-C       promethazine (PHENERGAN) 12.5 mg in sodium chloride 0.9 % 50 mL IVPB  12.5 mg Intravenous Q6H PRN Albustami, Flonnie Hailstone, MD       sodium chloride 0.9 % bolus 1,000 mL  1,000 mL Intravenous Once PRN Ginnie Smart, MD        Allergies as of 07/30/2023   (No Known Allergies)    Review of Systems:    Constitutional: No weight loss, fever, chills, weakness or fatigue HEENT: Eyes: No change in vision               Ears, Nose, Throat:  No change in hearing or congestion Skin: No rash or itching Cardiovascular: No chest pain, chest pressure or palpitations   Respiratory: No SOB or cough Gastrointestinal: See HPI and otherwise negative Genitourinary: No dysuria or change in urinary frequency Neurological: No headache, dizziness or syncope Musculoskeletal: No new muscle or joint pain Hematologic: No bleeding or bruising Psychiatric: No history of depression or anxiety     Physical Exam:  Vital signs in last 24 hours: Temp:  [97.5 F (36.4 C)-99 F (37.2 C)] 98.5 F (36.9 C) (12/24 0726) Pulse Rate:  [98-124] 98 (12/24 0645) Resp:  [10-27] 17 (12/24 0645) BP: (57-145)/(36-111) 133/111 (12/24 0645) SpO2:  [94 %-100 %] 94 % (12/24 0645) Weight:  [81.6 kg] 81.6 kg (12/23 1132) Last BM Date : 07/31/23 Last BM recorded by nurses in past 5 days No data recorded  General:   Chronically ill appearing elderly female in no acute distress Head:  Normocephalic and atraumatic. Eyes: sclerae anicteric,conjunctive pale  Heart: Tachycardic regular rhythm, systolic murmur Pulm: Clear anteriorly; no  wheezing Abdomen:  Soft, Obese AB, Active bowel sounds. mild tenderness right lower quadrant and epigastric. Without guarding and Without rebound, No organomegaly appreciated. Extremities:  Without edema,  Msk:  Symmetrical without gross deformities. Peripheral pulses palpable Neurologic:  Alert and  oriented x4;  No focal deficits.  No clonus or asterixis Skin:   Dry and intact without significant lesions or rashes.  Ecchymosis bilateral arms Psychiatric:  Cooperative, slightly lethargic. Normal mood and affect.  LAB RESULTS: Recent Labs    07/30/23 1219 07/30/23 1508 07/31/23 0222  WBC 19.7*  --  15.7*  HGB 10.3* 9.9* 5.3*  HCT 36.1 29.0* 18.5*  PLT 188  --  155   BMET Recent Labs    07/30/23 1219 07/30/23 1508 07/31/23 0222  NA 131* 137 133*  K 4.3 4.5 4.3  CL 103  --  105  CO2 18*  --  15*  GLUCOSE 433*  --  472*  BUN 13  --  34*  CREATININE 0.74  --  0.77  CALCIUM 8.5*  --  7.5*   LFT Recent Labs    07/30/23 1219 07/31/23 0222  PROT 6.0* 3.9*  ALBUMIN 3.2* 2.2*  AST 29 43*  ALT 24 34  ALKPHOS  59 35*  BILITOT 0.7 0.6  BILIDIR <0.1  --   IBILI NOT CALCULATED  --    PT/INR Recent Labs    07/31/23 0222  LABPROT 16.9*  INR 1.4*    STUDIES: MR Brain W and Wo Contrast Result Date: 07/30/2023 CLINICAL DATA:  New onset headache EXAM: MRI HEAD WITHOUT AND WITH CONTRAST TECHNIQUE: Multiplanar, multiecho pulse sequences of the brain and surrounding structures were obtained without and with intravenous contrast. CONTRAST:  8mL GADAVIST GADOBUTROL 1 MMOL/ML IV SOLN COMPARISON:  No prior MRI available, correlation is made with CT head 07/30/2023 FINDINGS: Brain: Restricted diffusion with ADC correlate in the right posterior temporal and lateral occipital and parietal cortex (series 3, images 22-33), which is associated with increased T2 hyperintense signal, gyral swelling, and hemosiderin deposition, likely petechial hemorrhage. Additional punctate foci of restricted  diffusion with ADC correlate in the right cerebellum (series 3, image 8), left temporal lobe (series 3, image 19), left occipital lobe (series 3, image 22), right occipital lobe (series 3, images 23), right parietal lobe (series 3, images 29-41), right frontal lobe (series 3, image 28 and 30), right thalamus (series 3, image 29), and left frontal lobe (series 3, images 25 and 33-34); these foci are both in the white matter and cortex. No abnormal parenchymal or meningeal enhancement. No mass, mass effect, or midline shift. No hydrocephalus or extra-axial collection. Pituitary and craniocervical junction within normal limits. Vascular: Normal arterial flow voids. Normal arterial and venous enhancement. Skull and upper cervical spine: Normal marrow signal. Sinuses/Orbits: Clear paranasal sinuses. No acute finding in the orbits. Other: The mastoid air cells are well aerated. IMPRESSION: 1. Acute to early subacute infarct in the right posterior temporal and lateral occipital and parietal cortex, with associated edema and petechial hemorrhage. 2. Additional punctate acute infarcts in the right cerebellum, left temporal lobe, left occipital lobe, right occipital lobe, right parietal lobe, right frontal lobe, right thalamus, and left frontal lobe. These foci are both in the white matter and cortex. The distribution is suggestive of a central embolic phenomenon. These results will be called to the ordering clinician or representative by the Radiologist Assistant, and communication documented in the PACS or [employer]. Electronically Signed   By: Wiliam Ke M.D.   On: 07/30/2023 22:55   CT CHEST ABDOMEN PELVIS W CONTRAST Result Date: 07/30/2023 CLINICAL DATA:  Trauma EXAM: CT CHEST, ABDOMEN, AND PELVIS WITH CONTRAST TECHNIQUE: Multidetector CT imaging of the chest, abdomen and pelvis was performed following the standard protocol during bolus administration of intravenous contrast. RADIATION DOSE REDUCTION: This  exam was performed according to the departmental dose-optimization program which includes automated exposure control, adjustment of the mA and/or kV according to patient size and/or use of iterative reconstruction technique. CONTRAST:  75mL OMNIPAQUE IOHEXOL 350 MG/ML SOLN COMPARISON:  None Available. FINDINGS: CT CHEST FINDINGS Cardiovascular: Severe aortic atherosclerosis. Normal heart size. No pericardial effusion. Mediastinum/Nodes: No enlarged mediastinal, hilar, or axillary lymph nodes. Thyroid gland, trachea, and esophagus demonstrate no significant findings. Lungs/Pleura: Mild centrilobular emphysema. Mild pulmonary fibrosis in a pattern with apical to basal gradient featuring irregular peripheral interstitial opacity and septal thickening with minimal traction bronchiectasis and minimal areas of subpleural bronchiolectasis of the lung bases. Small irregular nodule of the posterior left apex measuring 0.6 cm (series 6, image 69). No pleural effusion or pneumothorax. Musculoskeletal: No chest wall abnormality. No acute osseous findings. CT ABDOMEN PELVIS FINDINGS Hepatobiliary: No solid liver abnormality is seen. Coarse, nodular cirrhotic morphology of the liver.  No gallstones, gallbladder wall thickening, or biliary dilatation. Pancreas: Unremarkable. No pancreatic ductal dilatation or surrounding inflammatory changes. Spleen: Normal in size without significant abnormality. Adrenals/Urinary Tract: Adrenal glands are unremarkable. Kidneys are normal, without renal calculi, solid lesion, or hydronephrosis. Bladder is unremarkable. Stomach/Bowel: Stomach is within normal limits. Appendix appears normal. No evidence of bowel wall thickening, distention, or inflammatory changes. Sigmoid diverticulosis. Vascular/Lymphatic: Severe aortic atherosclerosis. 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 No enlarged abdominal or pelvic lymph nodes. Reproductive: No mass or other  abnormality. Other: No abdominal wall hernia or abnormality. No ascites. Musculoskeletal: No acute osseous findings. IMPRESSION: 1. No CT evidence of acute traumatic injury to the chest, abdomen, or pelvis. 2. Mild pulmonary fibrosis in a pattern with apical to basal gradient featuring irregular peripheral interstitial opacity and septal thickening with minimal traction bronchiectasis and minimal areas of subpleural bronchiolectasis of the lung bases. 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 3. Small irregular nodule of the posterior left apex measuring 0.6 cm. Non-contrast chest CT at 6-12 months is recommended. If the nodule is stable at time of repeat CT, then future CT at 18-24 months (from today's scan) is considered optional for low-risk patients, but is recommended for high-risk patients. This recommendation follows the consensus statement: Guidelines for Management of Incidental Pulmonary Nodules Detected on CT Images: From the Fleischner Society 2017; Radiology 2017; 284:228-243. 4. Cirrhosis. 5. 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 6. Sigmoid diverticulosis without evidence of acute diverticulitis. Aortic Atherosclerosis (ICD10-I70.0) and Emphysema (ICD10-J43.9). Electronically Signed   By: Jearld Lesch M.D.   On: 07/30/2023 14:18   CT HEAD WO CONTRAST Result Date: 07/30/2023 CLINICAL DATA:  Trauma EXAM: CT HEAD WITHOUT CONTRAST CT CERVICAL SPINE WITHOUT CONTRAST TECHNIQUE: Multidetector CT imaging of the head and cervical spine was performed following the standard protocol without intravenous contrast. Multiplanar CT image reconstructions of the cervical spine were also generated. RADIATION DOSE REDUCTION: This exam was performed according to the departmental dose-optimization program which includes automated exposure control, adjustment of the mA and/or kV according to patient size and/or use of iterative reconstruction technique. COMPARISON:  None  Available. FINDINGS: CT HEAD FINDINGS Brain: Cortical based hypodensity of the right parieto-occipital junction measuring 2.4 x 2.2 cm (series 11, image 25, series 9, image 24). No evidence of hemorrhage, hydrocephalus, extra-axial collection or mass effect. Vascular: No hyperdense vessel or unexpected calcification. Skull: Normal. Negative for fracture or focal lesion. Sinuses/Orbits: No acute finding. Other: Soft tissue contusion of the left scalp vertex (series 11, image 7) CT CERVICAL SPINE FINDINGS Alignment: Degenerative straightening of the normal cervical lordosis. Skull base and vertebrae: No acute fracture. No primary bone lesion or focal pathologic process. Soft tissues and spinal canal: No prevertebral fluid or swelling. No visible canal hematoma. Disc levels: Moderate disc space height loss and osteophytosis from C5-C7 Upper chest: Negative. Other: None. IMPRESSION: 1. Cortical based hypodensity of the right parieto-occipital junction measuring 2.4 x 2.2 cm. This is concerning for acute or subacute infarct or alternately underlying mass. Recommend MRI for further evaluation. 2. No evidence of hemorrhage or mass effect. 3. Soft tissue contusion of the left scalp vertex. 4. No fracture or static subluxation of the cervical spine. 5. Moderate disc degenerative disease from C5-C7. Electronically Signed   By: Jearld Lesch M.D.   On: 07/30/2023 14:13   CT CERVICAL SPINE WO CONTRAST Result Date: 07/30/2023 CLINICAL DATA:  Trauma EXAM: CT HEAD WITHOUT  CONTRAST CT CERVICAL SPINE WITHOUT CONTRAST TECHNIQUE: Multidetector CT imaging of the head and cervical spine was performed following the standard protocol without intravenous contrast. Multiplanar CT image reconstructions of the cervical spine were also generated. RADIATION DOSE REDUCTION: This exam was performed according to the departmental dose-optimization program which includes automated exposure control, adjustment of the mA and/or kV according to  patient size and/or use of iterative reconstruction technique. COMPARISON:  None Available. FINDINGS: CT HEAD FINDINGS Brain: Cortical based hypodensity of the right parieto-occipital junction measuring 2.4 x 2.2 cm (series 11, image 25, series 9, image 24). No evidence of hemorrhage, hydrocephalus, extra-axial collection or mass effect. Vascular: No hyperdense vessel or unexpected calcification. Skull: Normal. Negative for fracture or focal lesion. Sinuses/Orbits: No acute finding. Other: Soft tissue contusion of the left scalp vertex (series 11, image 7) CT CERVICAL SPINE FINDINGS Alignment: Degenerative straightening of the normal cervical lordosis. Skull base and vertebrae: No acute fracture. No primary bone lesion or focal pathologic process. Soft tissues and spinal canal: No prevertebral fluid or swelling. No visible canal hematoma. Disc levels: Moderate disc space height loss and osteophytosis from C5-C7 Upper chest: Negative. Other: None. IMPRESSION: 1. Cortical based hypodensity of the right parieto-occipital junction measuring 2.4 x 2.2 cm. This is concerning for acute or subacute infarct or alternately underlying mass. Recommend MRI for further evaluation. 2. No evidence of hemorrhage or mass effect. 3. Soft tissue contusion of the left scalp vertex. 4. No fracture or static subluxation of the cervical spine. 5. Moderate disc degenerative disease from C5-C7. Electronically Signed   By: Jearld Lesch M.D.   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 -Most likely patient has cirrhosis secondary to [facility] with history and physical  -Will get minimal hepatocellular workup -No history of alcohol use, IV drug use  previous normal hepatitis panel 2016 Current MELD 16, INR 1.4 Pending echocardiogram -Concern for variceal bleed, on octreotide PPI and ceftriaxone started 12/24 at 3 AM -Status post 2 PRBCs, has not had repeat H&H, had 1 more episode recently of large-volume hematochezia.  She is tachycardic but blood pressure still stable, satting well on room air.  Pending stat H&H -Will plan for endoscopic evaluation should be appropriate to take to endoscopy suite, ideally hemoglobin above 7, with does not suggest getting hemoglobin above 8 with possible ongoing variceal bleed. -N.p.o. currently, EGD with Dr. Tomasa Rand, risk and benefits discussed with patient she agrees.  May need to consult IR if gastric variceal hemorrhage. -Will give 10 mg IV Reglan now -No evidence of hepatic encephalopathy, pending EEG can get ammonia -Trend Hgb/Hct Q4-6H for 4 times  Hyperosmolar hyperglycemic state On insulin drip, per primary  SIRS/syncope with evidence of multifocal CVA Patient on Rocephin Pending echo study with bubble, CTA head and neck Not on anticoagulation at this time Per primary  Principal Problem:   Sepsis (HCC) Active Problems:   Syncope and collapse   Hyperglycemia   SIRS (systemic inflammatory response syndrome) (HCC)   Hepatic cirrhosis (HCC)   GI bleed   Gastric varices   Splenic varices   Cerebrovascular accident (CVA) (HCC)   Hyperosmolar hyperglycemic state (HHS) (HCC)    LOS: 0 days   Thank you for your kind consultation, we will continue  to follow.   Doree Albee  07/31/2023, 8:12 AM

## 2023-07-31 NOTE — Progress Notes (Signed)
OT Cancellation Note  Patient Details Name: Maria Rose MRN: [id number] DOB: [birth year]   Cancelled Treatment:    Reason Eval/Treat Not Completed: Patient at procedure or test/ unavailable (endoscopy)  Evern Bio 07/31/2023, 3:11 PM Berna Spare, OTR/L Acute Rehabilitation Services Office: [phone]

## 2023-07-31 NOTE — Progress Notes (Signed)
EEG complete - results pending 

## 2023-07-31 NOTE — Op Note (Signed)
[facility] Patient Name: Maria Rose Procedure Date : 07/31/2023 MRN: [id number] Attending MD: Dub Amis. Tomasa Rand , MD, [id number] Date of Birth: 22-Sep-1955 CSN: [id number] Age: 67 Admit Type: Inpatient Procedure:                Upper GI endoscopy Indications:              Hematemesis, Recent gastrointestinal bleeding Providers:                Lorin Picket E. Tomasa Rand, MD, Stephens Shire RN, RN, Alan Ripper, Technician Referring MD:              Medicines:                Monitored Anesthesia Care Complications:            No immediate complications. Estimated Blood Loss:     Estimated blood loss: none. Procedure:                Pre-Anesthesia Assessment:                           - Prior to the procedure, a History and Physical                            was performed, and patient medications and                            allergies were reviewed. The patient's tolerance of                            previous anesthesia was also reviewed. The risks                            and benefits of the procedure and the sedation                            options and risks were discussed with the patient.                            All questions were answered, and informed consent                            was obtained. Prior Anticoagulants: The patient has                            taken no anticoagulant or antiplatelet agents. ASA                            Grade Assessment: III - A patient with severe                            systemic disease. After reviewing the risks and  benefits, the patient was deemed in satisfactory                            condition to undergo the procedure.                           After obtaining informed consent, the endoscope was                            passed under direct vision. Throughout the                            procedure, the patient's blood pressure, pulse, and                             oxygen saturations were monitored continuously. The                            [id number]-[id number] ([id number]) Olympus endoscope was introduced                            through the mouth, and advanced to the second part                            of duodenum. The upper GI endoscopy was                            accomplished without difficulty. The patient                            tolerated the procedure well. Scope In: Scope Out: Findings:      Grade I varices were found in the lower third of the esophagus.      The exam of the esophagus was otherwise normal.      Type 2 gastroesophageal varices (GOV2, esophageal varices which extend       along the fundus) were found in the gastric fundus. There was copious       fresh blood, but no active bleeding. One varix was ulcerated, but there       was no adherent clot. They were medium in largest diameter. To reduce       risk of recurrent hemorrhage, 3 mL of hemostatic gel (Purestat) was       applied to the ulcerated varix. There was no bleeding during the       procedure.      Red blood was found in the entire examined stomach.      The exam of the stomach was otherwise normal.      Red blood was found in the entire duodenum.      The exam of the duodenum was otherwise normal. Impression:               - Grade I esophageal varices.                           - Type 2 gastroesophageal varices (GOV2, esophageal  varices which extend along the fundus), without                            bleeding. Hemostatic spray applied.                           - Red blood in the entire stomach.                           - Blood in the entire examined duodenum.                           - No specimens collected. Moderate Sedation:      N/A Recommendation:           - Return patient to ICU for ongoing care.                           - NPO today, ice chips okay. Advance to clear                            liquids tomorrow if no  further bleeding.                           - Continue present medications including octreotide                            drip x 72 hours, PPI IV BID, and empiric                            antibiotics for 5 days.                           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 If patient                            rebleeds, urgent TIPS would be the appropriate                            treatment unless absolutely contraindicated. Procedure Code(s):        --- Professional ---  43255, Esophagogastroduodenoscopy, flexible,                            transoral; with control of bleeding, any method Diagnosis Code(s):        --- Professional ---                           I85.00, Esophageal varices without bleeding                           I86.4, Gastric varices                           K92.2, Gastrointestinal hemorrhage, unspecified                           K92.0, Hematemesis CPT copyright 2022 [employer]. All rights reserved. The codes documented in this report are preliminary and upon coder review may  be revised to meet current compliance requirements. Kema Santaella E. Tomasa Rand, MD 07/31/2023 3:18:23 PM This report has been signed electronically. Number of Addenda: 0

## 2023-07-31 NOTE — Consult Note (Signed)
NEUROLOGY CONSULT NOTE   Date of service: July 31, 2023 Patient Name: Maria Rose MRN:  [id number] DOB:  February 28, 1956 Chief Complaint: "Shaking and syncope" Requesting Provider: Ginnie Smart, MD  History of Present Illness  Maria Rose is a 67 y.o. female, with past medical history only of nasal cellulitis [facility] brought in for evaluation after being found by her husband breathing hard and quick, with husband saying that she was completely unresponsive for 10 seconds and then woke up giving short answers.  She reports that she was possibly shaking her whole body.  She remained oriented after coming around but still was not answering questions completely.  Her blood glucose was 500.  In the emergency department she was noted to have a blood glucose level of 433 and a lactate of 3.8 with a WBC count of 9.7.  Code sepsis was called and she was started on empiric antibiotics.  She was afebrile and normotensive. Further workup was started including MRI of the brain that showed scattered embolic looking infarcts in multiple territories concerning for a cardiac source after the CT head showed hypodensity in the right parieto-occipital junction concerning for acute or subacute infarct. She denies any recent sick contacts. Denies shortness of breath or chest pain. Reports a headache.  Denies any visual disturbances. Reports increased fatigue, nausea, polyuria, vomiting, generalized weakness and increased thirst.  Neurology was consulted after the MRI of the brain revealed scattered infarcts.  LKW: Prior to presentation-sometime before noon of 07/30/2023 Modified rankin score: 0-Completely asymptomatic and back to baseline post- stroke IV Thrombolysis: Outside the window EVT: No-MR with both acute and subacute looking infarcts.  NIH stroke scale 0.  NIHSS components Score: Comment  1a Level of Conscious 0[x]  1[]  2[]  3[]      1b LOC Questions 0[x]  1[]   2[]       1c LOC Commands 0[x]  1[]  2[]       2 Best Gaze 0[x]  1[]  2[]       3 Visual 0[x]  1[]  2[]  3[]      4 Facial Palsy 0[x]  1[]  2[]  3[]      5a Motor Arm - left 0[x]  1[]  2[]  3[]  4[]  UN[]    5b Motor Arm - Right 0[x]  1[]  2[]  3[]  4[]  UN[]    6a Motor Leg - Left 0[x]  1[]  2[]  3[]  4[]  UN[]    6b Motor Leg - Right 0[x]  1[]  2[]  3[]  4[]  UN[]    7 Limb Ataxia 0[x]  1[]  2[]  3[]  UN[]     8 Sensory 0[x]  1[]  2[]  UN[]      9 Best Language 0[x]  1[]  2[]  3[]      10 Dysarthria 0[x]  1[]  2[]  UN[]      11 Extinct. and Inattention 0[x]  1[]  2[]       TOTAL: 0      ROS  Comprehensive ROS performed and pertinent positives documented in HPI    Past History  History reviewed. No pertinent past medical history.  History reviewed. No pertinent surgical history.  Family History: History reviewed. No pertinent family history.  Social History  reports that she has been smoking. She has never used smokeless tobacco. She reports current alcohol use. No history on file for drug use.  No Known Allergies  Medications   Current Facility-Administered Medications:    ceFEPIme (MAXIPIME) 2 g in sodium chloride 0.9 % 100 mL IVPB, 2 g, Intravenous, Q8H, Arabella Merles, RPH, Last Rate: 200 mL/hr at 07/31/23 0118, 2 g at 07/31/23 0118   insulin aspart (novoLOG) injection 0-15  Units, 0-15 Units, Subcutaneous, TID WC, Nooruddin, Saad, MD, 5 Units at 2023-08-20 1901   insulin aspart (novoLOG) injection 0-5 Units, 0-5 Units, Subcutaneous, QHS, Nooruddin, Saad, MD, 3 Units at 08-20-2023 2305   insulin glargine-yfgn (SEMGLEE) injection 16 Units, 16 Units, Subcutaneous, QHS, Nooruddin, Saad, MD, 16 Units at 2023/08/20 2305   vancomycin (VANCOCIN) IVPB 1000 mg/200 mL premix, 1,000 mg, Intravenous, Q12H, Arabella Merles, RPH  Vitals   Vitals:   2023-08-20 1742 08-20-23 2117 08-20-2023 2317 07/31/23 0116  BP: (!) 132/56 (!) 124/55 (!) 131/56 (!) 106/51  Pulse:  (!) 115 (!) 106 (!) 107  Resp:  20 20 17   Temp:  98 F (36.7 C) 98.5 F (36.9 C)  98.4 F (36.9 C)  TempSrc:   Oral Oral  SpO2:  99% 98% 98%  Weight:      Height:        Body mass index is 29.05 kg/m.  Physical Exam  General: Well-developed well-nourished in no distress HEENT: Normocephalic atraumatic Chest:Clear Cardiovascular: Regular rhythm Abdomen nondistended nontender Neurological exam Awake alert oriented x 3 Speech is not dysarthric No aphasia Cranial nerves: Pupils equal round react light, extraocular movements intact, visual fields full, facial sensation intact, face symmetric, tongue and palate midline. Motor examination with no drift in any of the 4 extremities Sensation intact Coordination reveals no gross dysmetria  Labs/Imaging/Neurodiagnostic studies   CBC:  Recent Labs  Lab 08-20-23 1219 20-Aug-2023 1508  WBC 19.7*  --   HGB 10.3* 9.9*  HCT 36.1 29.0*  MCV 66.6*  --   PLT 188  --    Basic Metabolic Panel:  Lab Results  Component Value Date   NA 137 08-20-23   K 4.5 20-Aug-2023   CO2 18 (L) 2023/08/20   GLUCOSE 433 (H) 08/20/2023   BUN 13 2023-08-20   CREATININE 0.74 Aug 20, 2023   CALCIUM 8.5 (L) 08-20-2023   GFRNONAA >60 08-20-2023    HgbA1c:  Lab Results  Component Value Date   HGBA1C 11.4 (H) 2023/08/20    CT Head without contrast, CT C-spine (Personally reviewed): Cortical hypodensity in the right parieto-occipital junction concerning for acute to subacute infarct.  No hemorrhage.  Soft tissue contusion over the left scalp vertex.  No fracture or status subluxation of cervical spine.  Moderate DJD C5-C7  CT chest abdomen pelvis with contrast: No CT evidence of acute traumatic injury.  Mild pulmonary fibrosis in a pattern with apical to basal gradient featuring irregular peripheral interstitial opacity and septal thickening with minimal traction bronchiectasis and minimal areas of subpleural bronchiectasis on the lung bases.  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  Small irregular nodule in the  posterior left apex measuring 0.6 cm.  Noncontrast chest CT at 6 to 12 months is recommended.  Cirrhosis.  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  Sigmoid diverticulosis without evidence of acute diverticulitis.  MRI brain personally reviewed-acute to early subacute infarct in the right posterior temporal and lateral occipital parietal cortex with associated edema and petechial hemorrhage.  Additional punctate acute infarcts in the right cerebellum, left temporal lobe, left occipital lobe, right occipital lobe, right parietal lobe, right frontal lobe, right thalamus and left frontal lobe-within both white matter and the cortex with distribution suggestive of a central embolic phenomenon.  ASSESSMENT   ELISSIA MILANOVICH is a 67 y.o. female Past medical history as above presenting for evaluation of an episode of being unresponsive as well as complaints of whole body shaking  and possible syncope/near syncope with blood sugars in the 500s and a CT head concerning for acute to subacute stroke with MRI of the brain confirming multiple vascular territories showing subacute and acute infarctions concerning for a central embolic source.  Given that she also had a sepsis-like picture and presentation, a central embolic source such as infective endocarditis is also possible.     IMP Acute and subacute ischemic strokes in multiple vascular territories Episode of shaking with LOC - Syncope vs Seizure?   RECOMMENDATIONS  Frequent neurochecks Tele No antiplatelets or anticoagulants (may have mycotic aneurysms) Blood c/s and Infective endocarditis w/u per primary team Abx per primary team Check CTA head and neck 2D Echo. Will likely need a TEE A1c - 11.4 - goal <7. Medical management of DM per IM Lipid panel - goal LDL <70 PT OT ST Allow permissive HTN - treat only if SBP is >220 on a PRN basis for the next 24h and then start normalizing BP for a goal 140/90 or less on discharge Routine  EEG - no AED for now. Plan relayed to Dr. Lily Kocher - resident MD IMTS  Stroke team will follow in the AM  ______________________________________________________________________    Signed, Milon Dikes, MD [facility]

## 2023-07-31 NOTE — Progress Notes (Signed)
Date and time results received: 07/31/23 0304  Reported by: Rudie Meyer, LAB Reported to: Pamelia Hoit, RN  Test: Hemoglobin Critical Value: 5.3  Name of Provider Notified: Morene Crocker  Orders Received? Or Actions Taken?: MD on unit, notified, writing orders at this time

## 2023-08-01 ENCOUNTER — Inpatient Hospital Stay (HOSPITAL_COMMUNITY): Payer: Commercial Managed Care - PPO

## 2023-08-01 DIAGNOSIS — R6520 Severe sepsis without septic shock: Secondary | ICD-10-CM

## 2023-08-01 DIAGNOSIS — A419 Sepsis, unspecified organism: Secondary | ICD-10-CM | POA: Diagnosis not present

## 2023-08-01 DIAGNOSIS — I6340 Cerebral infarction due to embolism of unspecified cerebral artery: Secondary | ICD-10-CM

## 2023-08-01 LAB — BASIC METABOLIC PANEL
Anion gap: 6 (ref 5–15)
BUN: 24 mg/dL — ABNORMAL HIGH (ref 8–23)
CO2: 23 mmol/L (ref 22–32)
Calcium: 7.4 mg/dL — ABNORMAL LOW (ref 8.9–10.3)
Chloride: 108 mmol/L (ref 98–111)
Creatinine, Ser: 0.59 mg/dL (ref 0.44–1.00)
GFR, Estimated: >60 mL/min (ref >60)
Glucose, Bld: 104 mg/dL — ABNORMAL HIGH (ref 70–99)
Potassium: 3.4 mmol/L — ABNORMAL LOW (ref 3.5–5.1)
Sodium: 137 mmol/L (ref 135–145)

## 2023-08-01 LAB — TYPE AND SCREEN
ABO/RH(D): O NEG
Antibody Screen: NEGATIVE
Unit division: 0
Unit division: 0
Unit division: 0

## 2023-08-01 LAB — BPAM RBC
Blood Product Expiration Date: 202501042359
Blood Product Expiration Date: 202501042359
Blood Product Expiration Date: 202501042359
ISSUE DATE / TIME: 202412240324
ISSUE DATE / TIME: 202412240447
ISSUE DATE / TIME: 202412242203
Unit Type and Rh: 9500
Unit Type and Rh: 9500
Unit Type and Rh: 9500

## 2023-08-01 LAB — HEMOGLOBIN AND HEMATOCRIT, BLOOD
HCT: 24.7 % — ABNORMAL LOW (ref 36.0–46.0)
Hemoglobin: 8.3 g/dL — ABNORMAL LOW (ref 12.0–15.0)

## 2023-08-01 LAB — GLUCOSE, CAPILLARY
Glucose-Capillary: 106 mg/dL — ABNORMAL HIGH (ref 70–99)
Glucose-Capillary: 139 mg/dL — ABNORMAL HIGH (ref 70–99)
Glucose-Capillary: 165 mg/dL — ABNORMAL HIGH (ref 70–99)
Glucose-Capillary: 187 mg/dL — ABNORMAL HIGH (ref 70–99)
Glucose-Capillary: 201 mg/dL — ABNORMAL HIGH (ref 70–99)
Glucose-Capillary: 208 mg/dL — ABNORMAL HIGH (ref 70–99)

## 2023-08-01 LAB — MAGNESIUM: Magnesium: 2 mg/dL (ref 1.7–2.4)

## 2023-08-01 MED ORDER — IOHEXOL 350 MG/ML SOLN
75.0000 mL | Freq: Once | INTRAVENOUS | Status: AC | PRN
  Administered 2023-08-01: 75 mL via INTRAVENOUS

## 2023-08-01 MED ORDER — ACETAMINOPHEN 325 MG PO TABS
650.0000 mg | ORAL_TABLET | Freq: Four times a day (QID) | ORAL | Status: DC | PRN
  Administered 2023-08-01 – 2023-08-02 (×3): 650 mg via ORAL
  Filled 2023-08-01 (×4): qty 2

## 2023-08-01 MED ORDER — MUPIROCIN 2 % EX OINT
TOPICAL_OINTMENT | Freq: Two times a day (BID) | CUTANEOUS | Status: DC
  Administered 2023-08-01 – 2023-08-07 (×5): 1 via NASAL
  Filled 2023-08-01 (×3): qty 22

## 2023-08-01 MED ORDER — POTASSIUM CHLORIDE CRYS ER 20 MEQ PO TBCR
40.0000 meq | EXTENDED_RELEASE_TABLET | Freq: Once | ORAL | Status: AC
  Administered 2023-08-01: 40 meq via ORAL
  Filled 2023-08-01: qty 2

## 2023-08-01 NOTE — Progress Notes (Signed)
Internal Medicine Teaching Service to assume care on 12/26 at 7AM.  Jason Fila Larkin Alfred Internal Medicine Resident PGY-2

## 2023-08-01 NOTE — Consult Note (Signed)
NAME:  Maria Rose, MRN:  [id number], DOB:  19-Feb-1956, LOS: 1 ADMISSION DATE:  07/30/2023, CONSULTATION DATE:  07/31/23 REFERRING MD:  Maria Rose CHIEF COMPLAINT:  GIB   History of Present Illness:  Maria Rose is a 67 y.o. female who has a PMH as below. She had not seen a healthcare provider in almost 10 years. On 12/23, she was admitted after a fall following a shaking episode. She had been having headaches as well as nausea prior to this. Per husband, she was initially unresponsive for about 10 seconds then after the fall she felt a bit disoriented and was answering questions with only one word.  She had CT head/C spine that demonstrated cortical hypodensity at the right parietooccipital junction measuring 2.4x2.2cm, no hemorrhage, DDD C5-C7. 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  MRI brain after admission demonstrated acute to early subacute infarct in the right posterior temporal and lateral occipital parietal cortex with associated edema and petechial hemorrhage. Additional punctate acute infarcts in right cerebellum, left temporal lobe, left occipital lobe, right occipital lobe, right parietal lobe, right frontal lobe, right thalamus, left frontal lobe.  Early AM 12/24 she had 2 episodes of large bloody bowel movements initially tarry mixed with a little bit of bright red and clots. BP dropped slightly to SBP 80s after 2nd large episode, repeat Hgb had dropped from 10.3 to 5.3 and lactic acid had climbed from 2.7 to 8.7. She later had small volume hematemesis. Due to ongoing bleed with new hematemesis, decision made to transfer to ICU and PCCM consulted for assistance. She was started on 2u PRBC and a 1L NS bolus. BP improved with SBP 120s. She is currently on 1 of 2u PRBC's.  She denies any prior GIB, any alcohol use, frequent NSAIDs, antiplatelets/anticoagulants. Has mild  abdominal pain only with deep palpation and not at baseline. Does have nausea and has not received any antiemetics yet.  Pertinent  Medical History:  has Sepsis (HCC); Syncope and collapse; Hyperglycemia; SIRS (systemic inflammatory response syndrome) (HCC); Hepatic cirrhosis (HCC); GI bleed; Gastric varices; Splenic varices; Cerebrovascular accident (CVA) (HCC); and Hyperosmolar hyperglycemic state (HHS) (HCC) on their problem list.  Significant Hospital Events: Including procedures, antibiotic start and stop dates in addition to other pertinent events   12/23 admit. 12/24 transfer to ICU due to ongoing LGIB and hematemesis with drop in Hgb from 10.3 to 5.3  Interim History / Subjective:  No issues overnight.  Tolerated procedure yesterday. Objective:  Blood pressure (!) 148/116, pulse 95, temperature 98.2 F (36.8 C), temperature source Oral, resp. rate (!) 25, height 5\' 6"  (1.676 m), weight 86.7 kg, SpO2 97%.        Intake/Output Summary (Last 24 hours) at 08/01/2023 6578 Last data filed at 08/01/2023 0844 Gross per 24 hour  Intake 3137.73 ml  Output 850 ml  Net 2287.73 ml   Filed Weights   07/30/23 1132 08/01/23 0430  Weight: 81.6 kg 86.7 kg    Examination: General: Female resting in bed no distress Neuro: Awake alert following commands HEENT: NCAT tracking appropriately Cardiovascular: R regular rate rhythm S1-S2 Lungs: Clear to auscultation bilaterally diminished breath sounds Abdomen: Obese, mildly distended Musculoskeletal: No significant edema Skin: No rash   Labs/imaging personally reviewed:  CT head/CSpine 12/23 > cortical hypodensity at the right parietooccipital junction measuring 2.4x2.2cm, no hemorrhage, DDD C5-C7.  CT A/P 12/23 > mild probable UIP, small irregular nodule of  posterior left apex, cirrhosis, splenic and gastric varices, sigmoid diverticulosis. MRI brain 12/23 > acute to early subacute infarct in the right posterior temporal and lateral  occipital parietal cortex with associated edema and petechial hemorrhage. Additional punctate acute infarcts in right cerebellum, left temporal lobe, left occipital lobe, right occipital lobe, right parietal lobe, right frontal lobe, right thalamus, left frontal lobe. CTA head/neck 12/24 >  Echo 12/24 > EEG 12/24 >   Assessment & Plan:   GIB - unclear etiology but with large volume tarry stools and new hematemesis, concern for gastric varices as source. ABLA - 2/2 above. - continue octreotide  - IV PPI  - plans for TIPS by IR   Acute vs subacute CVA with multiple areas of punctate infarcts - concern for embolic source. - no ac  - echo completed  -  PT OT SLP  Concern for occult sepsis - unclear source thus far. P: Continue abx  De-escalate based on cultures  Hyperglycemia with new DM (A1c 11.4) - concern for HHS given continued hyperglycemia with metabolic acidosis. BHBA negative. 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 Small LUL nodule. - will need outpatient follow up   Best practice (evaluated daily):  Diet/type: NPO DVT prophylaxis: SCD Pressure ulcer(s): pressure ulcer assessment deferred  GI prophylaxis: PPI Lines: N/A Foley:  N/A Code Status:  full code Last date of multidisciplinary goals of care discussion: None yet.  Labs   CBC: Recent Labs  Lab 07/30/23 1219 07/30/23 1508 07/31/23 0222 07/31/23 0935 07/31/23 1355 07/31/23 2014 08/01/23 0315  WBC 19.7*  --  15.7* 16.7*  --   --   --   NEUTROABS  --   --  10.7*  --   --   --   --   HGB 10.3*   < > 5.3* 9.1* 8.4* 7.2* 8.3*  HCT 36.1   < > 18.5* 28.4* 25.6* 22.7* 24.7*  MCV 66.6*  --  66.1* 73.6*  --   --   --   PLT 188  --  155 135*  --   --   --    < > = values in this interval not displayed.    Basic Metabolic Panel: Recent Labs  Lab 07/30/23 1219 07/30/23 1508 07/31/23 0222 07/31/23 0935 07/31/23 1355 07/31/23 2014 07/31/23 2140 08/01/23 0315 08/01/23 0317  NA 131*   < >  133*  --  139 141 140 137  --   K 4.3   < > 4.3  --  3.7 3.8 3.5 3.4*  --   CL 103  --  105  --  110 110 111 108  --   CO2 18*  --  15*  --  20* 20* 23 23  --   GLUCOSE 433*  --  472*  --  153* 164* 145* 104*  --   BUN 13  --  34*  --  25* 25* 23 24*  --   CREATININE 0.74  --  0.77  --  0.58 0.58 0.62 0.59  --   CALCIUM 8.5*  --  7.5*  --  7.8* 7.9* 7.6* 7.4*  --   MG 1.8  --   --  1.7  --   --   --   --  2.0  PHOS  --   --   --  3.5  --   --   --   --   --    < > =  values in this interval not displayed.   GFR: Estimated Creatinine Clearance: 74.7 mL/min (by C-G formula based on SCr of 0.59 mg/dL). Recent Labs  Lab 07/30/23 1219 07/30/23 1444 07/30/23 2043 07/31/23 0222 07/31/23 0935  PROCALCITON  --   --   --   --  0.12  WBC 19.7*  --   --  15.7* 16.7*  LATICACIDVEN  --  3.8* 2.7* 8.4* 3.5*    Liver Function Tests: Recent Labs  Lab 07/30/23 1219 07/31/23 0222  AST 29 43*  ALT 24 34  ALKPHOS 59 35*  BILITOT 0.7 0.6  PROT 6.0* 3.9*  ALBUMIN 3.2* 2.2*   No results for input(s): "LIPASE", "AMYLASE" in the last 168 hours. Recent Labs  Lab 07/31/23 2014  AMMONIA 32    ABG    Component Value Date/Time   HCO3 20.3 07/30/2023 1508   TCO2 21 (L) 07/30/2023 1508   ACIDBASEDEF 5.0 (H) 07/30/2023 1508   O2SAT 90 07/30/2023 1508     Coagulation Profile: Recent Labs  Lab 07/31/23 0222  INR 1.4*    Cardiac Enzymes: Recent Labs  Lab 07/30/23 1444  CKTOTAL 69    HbA1C: Hgb A1c MFr Bld  Date/Time Value Ref Range Status  07/30/2023 02:46 PM 11.4 (H) 4.8 - 5.6 % Final    Comment:    (NOTE) Pre diabetes:          5.7%-6.4%  Diabetes:              >6.4%  Glycemic control for   <7.0% adults with diabetes     CBG: Recent Labs  Lab 07/31/23 1531 07/31/23 1918 07/31/23 2315 08/01/23 0327 08/01/23 0736  GLUCAP 153* 168* 146* 106* 139*   Stable for transfer from the intensive care unit.  I have discussed with IM TS.  Josephine Igo, DO [facility] Critical Care 08/01/2023 9:38 AM

## 2023-08-01 NOTE — Consult Note (Signed)
Chief Complaint: Bleeding varices  Referring Provider(s): Audie Box  Supervising Physician: Mir, Mauri Reading  Patient Status: [facility] - In-pt  History of Present Illness: Maria Rose is a 67 y.o. female who presented to the ED on 07/30/23 after a syncopal episode.  Hgb 10.3, platelets 188  AST 29, ALT 24, alk phos 59, total bilirubin 0.79 INR 1.4  CT chest abdomen pelvis with contrast showed cirrhosis and splenic and gastric varices.  GI was consulted when patient began to have melena and then hematemesis with drop in hemoglobin from 10.3-5.3.  Endoscopy showed bleeding varices.  We are asked to evaluate for TIPS.  Patient is Full Code  Past Medical History Fatty Liver Diabetes Obesity  History reviewed. No pertinent surgical history.  Allergies: Patient has no known allergies.  Medications: Prior to Admission medications   Medication Sig Start Date End Date Taking? Authorizing Provider  clotrimazole (LOTRIMIN) 1 % cream Apply to affected area 2 times daily 05/16/23  Yes Renne Crigler, PA-C  erythromycin ophthalmic ointment Place 1 Application into the left eye 4 (four) times daily. 06/03/23  Yes [provider]  mupirocin ointment (BACTROBAN) 2 % Apply 1 Application topically daily. 06/26/23  Yes [provider]     History reviewed. No pertinent family history.  Social History   Socioeconomic History   Marital status: Single    Spouse name: Not on file   Number of children: Not on file   Years of education: Not on file   Highest education level: Not on file  Occupational History   Not on file  Tobacco Use   Smoking status: Every Day   Smokeless tobacco: Never  Substance and Sexual Activity   Alcohol use: Yes   Drug use: Not on file   Sexual activity: Not on file  Other Topics Concern   Not on file  Social History Narrative   Not on file   Social Drivers of Health   Financial Resource Strain: Not on file  Food  Insecurity: No Food Insecurity (07/30/2023)   Hunger Vital Sign    Worried About Running Out of Food in the Last Year: Never true    Ran Out of Food in the Last Year: Never true  Transportation Needs: No Transportation Needs (07/30/2023)   PRAPARE - Administrator, Civil Service (Medical): No    Lack of Transportation (Non-Medical): No  Physical Activity: Not on file  Stress: Not on file  Social Connections: Unknown (05/14/2023)   Received from [facility]   Social Network    Social Network: Not on file     Review of Systems: A 12 point ROS discussed and pertinent positives are indicated in the HPI above.  All other systems are negative.   Vital Signs: BP (!) 148/116 (BP Location: Right Arm)   Pulse 95   Temp 98.2 F (36.8 C) (Oral)   Resp (!) 25   Ht 5\' 6"  (1.676 m)   Wt 191 lb 2.2 oz (86.7 kg)   SpO2 97%   BMI 30.85 kg/m   Advance Care Plan: The advanced care place/surrogate decision maker was discussed at the time of visit and the patient did not wish to discuss or was not able to name a surrogate decision maker or provide an advance care plan.  Physical Exam Vitals reviewed.  Constitutional:      Appearance: Normal appearance.  Cardiovascular:     Rate and Rhythm: Normal rate and regular rhythm.  Pulmonary:  Effort: Pulmonary effort is normal. No respiratory distress.  Neurological:     General: No focal deficit present.     Mental Status: She is alert and oriented to person, place, and time.  Psychiatric:        Mood and Affect: Mood normal.        Behavior: Behavior normal.        Thought Content: Thought content normal.        Judgment: Judgment normal.     Imaging:  CLINICAL DATA:  Gastric varices, with liver cirrhosis on the recent CT.   EXAM: CTA ABDOMEN AND PELVIS WITHOUT AND WITH CONTRAST   TECHNIQUE: Multidetector CT imaging of the abdomen and pelvis was performed using the standard protocol during bolus administration  of intravenous contrast. Multiplanar reconstructed images and MIPs were obtained and reviewed to evaluate the vascular anatomy.   RADIATION DOSE REDUCTION: This exam was performed according to the departmental dose-optimization program which includes automated exposure control, adjustment of the mA and/or kV according to patient size and/or use of iterative reconstruction technique.   CONTRAST:  75mL OMNIPAQUE IOHEXOL 350 MG/ML SOLN   COMPARISON:  CT chest, abdomen and pelvis with IV contrast, done for blunt polytrauma, yesterday at 1:29 p.m.   FINDINGS: VASCULAR   Aorta: There are moderate to heavy aortic mixed plaques without aneurysm, stenosis or dissection, or penetrating aortic ulcer.   Celiac: Patent without evidence of aneurysm, dissection, vasculitis or significant stenosis.   SMA: 40% non flow-limiting soft plaque vessel origin stenosis. The vessel otherwise opacifies well. No branch occlusion.   Renals: There are calcifications at both renal artery origins but no flow-limiting stenosis, aneurysm or dissection. Both renal arteries are single.   IMA: There is a 75% mixed plaque vessel origin stenosis. The vessel otherwise opacify as well.   Inflow: There are moderate calcific plaques especially of the common iliac and internal iliac arteries, but no flow-limiting stenosis.   Proximal Outflow: There are calcific/mixed plaques in both common femoral arteries, right proximal superficial femoral artery, but no flow-limiting stenosis of the visualized outflow arteries.   Veins: Patent. Mild prominence of the hepatic portal main vein measuring 14.5 mm. No deep pelvic vein or IVC filling defects.   In the venous phase, venous varices are again shown along the posterior proximal gastric wall, with splenorenal varices posterior to the.   There is no recanalization of the umbilical vein. There are no distal esophageal varices.   Review of the MIP images confirms the  above findings.   NON-VASCULAR   Lower chest: There is subpleural reticulation and scattered subpleural bronchiolectasis in the lung bases with mild peripheral faint ground-glass disease, findings consistent with chronic interstitial lung disease.   The cardiac size is normal. The inferior mitral ring is heavily calcified. No pericardial effusion.   The cardiac blood pool is less dense than the myocardium suggesting anemia.   Hepatobiliary: 20 cm in length, mildly steatotic, with nodular capsular contour consistent with cirrhosis.   No enhancing liver masses seen. There is excreted contrast filling the gallbladder.   There are no stones or wall thickening but there is interval new pericholecystic edema and trace pericholecystic fluid.   In this case it is probably congestive or related to fluid overload given the hepatic cirrhosis, but please correlate clinically for acalculous cholecystitis. There is no bile duct dilatation.   Pancreas: No abnormality.   Spleen: Mildly prominent spleen 13.8 cm craniocaudal. No focal lesion. Mildly engorged splenic vein.  Adrenals/Urinary Tract: No abnormality.   Stomach/Bowel: Contracted stomach, unremarkable apart from the gastric varices. Normal caliber small intestine and appendix.   Mild wall thickening has developed in the ascending, transverse and proximal descending colon. There is sigmoid diverticulosis but no evidence of diverticulitis.   The wall thickening could be congestive, related to hepatic dysfunction, or due to colitis.   Stranding changes have also developed over portions of the thickened colon.   No vascular contrast extravasation is seen into the GI tract.   Lymphatic: No adenopathy.   Reproductive: Uterus and bilateral adnexa are unremarkable.   Other: No abdominal wall hernia or abnormality. No abdominopelvic ascites.   Musculoskeletal: Levoscoliosis and advanced degenerative change lumbar spine. Most  advanced at L1-2 through L3-4 with acquired spinal stenosis L3-4 and L4-5.   There is osteopenia and additional degenerative change in the thoracic spine.   IMPRESSION: 1. Aortic and branch vessel atherosclerosis without aneurysm, dissection or flow-limiting aortic stenosis. 2. 40% non flow-limiting soft plaque stenosis of the SMA. 3. 75% mixed plaque stenosis of the IMA. 4. Chronic interstitial lung disease in the lung bases. 5. Likely anemia. 6. Hepatic cirrhosis, with mildly prominent hepatic portal main vein and splenic vein, and gastric and splenorenal varices. No recanalization of the umbilical vein or distal esophageal varices. 7. Mildly prominent spleen. 8. Interval development of pericholecystic edema and trace pericholecystic fluid. In this case it is probably congestive or related to fluid overload given the hepatic cirrhosis, but please correlate clinically for acalculous cholecystitis. 9. Interval development of mild wall thickening in the ascending, transverse and proximal descending colon, with stranding changes over portions of the thickened colon. This could be congestive, related to hepatic dysfunction, or due to colitis. 10. Diverticulosis without evidence of diverticulitis. 11. Osteopenia, lumbar scoliosis, and degenerative change.   Aortic Atherosclerosis (ICD10-I70.0).     Electronically Signed   By: Almira Bar M.D.   On: 08/01/2023 07:24  Labs:  CBC: Recent Labs    05/16/23 1246 07/30/23 1219 07/30/23 1508 07/31/23 0222 07/31/23 0935 07/31/23 1355 07/31/23 2014 08/01/23 0315  WBC 9.3 19.7*  --  15.7* 16.7*  --   --   --   HGB 11.3* 10.3*   < > 5.3* 9.1* 8.4* 7.2* 8.3*  HCT 39.8 36.1   < > 18.5* 28.4* 25.6* 22.7* 24.7*  PLT 149* 188  --  155 135*  --   --   --    < > = values in this interval not displayed.    COAGS: Recent Labs    07/31/23 0222  INR 1.4*    BMP: Recent Labs    07/31/23 1355 07/31/23 2014 07/31/23 2140  08/01/23 0315  NA 139 141 140 137  K 3.7 3.8 3.5 3.4*  CL 110 110 111 108  CO2 20* 20* 23 23  GLUCOSE 153* 164* 145* 104*  BUN 25* 25* 23 24*  CALCIUM 7.8* 7.9* 7.6* 7.4*  CREATININE 0.58 0.58 0.62 0.59  GFRNONAA >60 >60 >60 >60    LIVER FUNCTION TESTS: Recent Labs    05/16/23 1246 07/30/23 1219 07/31/23 0222  BILITOT 0.5 0.7 0.6  AST 25 29 43*  ALT 21 24 34  ALKPHOS 78 59 35*  PROT 7.3 6.0* 3.9*  ALBUMIN 3.8 3.2* 2.2*    TUMOR MARKERS: No results for input(s): "AFPTM", "CEA", "CA199", "CHROMGRNA" in the last 8760 hours.  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  She has been transfused and is currently stable without signs of re-bleeding.  Currently Na MELD = 10  ECHO showed normal RV and LV function.  CTA reviewed by Dr. Bryn Gulling.  Anatomy amenable to TIPS.  Risks and benefits of TIPS, BRTO and/or additional variceal embolization were discussed with the patient and/or the patient's family including, but not limited to, infection, bleeding, damage to adjacent structures, worsening hepatic and/or cardiac function, worsening and/or the development of altered mental status/encephalopathy, non-target embolization and death.   All of the patient's questions were answered, patient is agreeable to proceed.  Consent signed and in IR Consent Box  IR will remain on standby for now in case of re-bleeding over the Holiday. Will plan elective TIPS after the Holiday. We will need to arrange with anesthesia.  From IR standpoint, she may be moved to a regular room and may have a diet.  Thank you for allowing our service to participate in ELNITA RICOTTA 's care.  Electronically Signed: Gwynneth Macleod, PA-C   08/01/2023, 9:20 AM      I spent a total of 40 Minutes  in face to face in clinical consultation, greater than 50% of which was counseling/coordinating care for TIPS.

## 2023-08-01 NOTE — Plan of Care (Signed)
Problem: Education: Goal: Ability to describe self-care measures that may prevent or decrease complications (Diabetes Survival Skills Education) will improve Outcome: Progressing Goal: Individualized Educational Video(s) Outcome: Progressing   Problem: Coping: Goal: Ability to adjust to condition or change in health will improve Outcome: Progressing   Problem: Fluid Volume: Goal: Ability to maintain a balanced intake and output will improve Outcome: Progressing   Problem: Health Behavior/Discharge Planning: Goal: Ability to identify and utilize available resources and services will improve Outcome: Progressing Goal: Ability to manage health-related needs will improve Outcome: Progressing   Problem: Metabolic: Goal: Ability to maintain appropriate glucose levels will improve Outcome: Progressing   Problem: Nutritional: Goal: Maintenance of adequate nutrition will improve Outcome: Progressing Goal: Progress toward achieving an optimal weight will improve Outcome: Progressing   Problem: Skin Integrity: Goal: Risk for impaired skin integrity will decrease Outcome: Progressing   Problem: Tissue Perfusion: Goal: Adequacy of tissue perfusion will improve Outcome: Progressing   Problem: Education: Goal: Knowledge of General Education information will improve Description: Including pain rating scale, medication(s)/side effects and non-pharmacologic comfort measures Outcome: Progressing   Problem: Health Behavior/Discharge Planning: Goal: Ability to manage health-related needs will improve Outcome: Progressing   Problem: Clinical Measurements: Goal: Ability to maintain clinical measurements within normal limits will improve Outcome: Progressing Goal: Will remain free from infection Outcome: Progressing Goal: Diagnostic test results will improve Outcome: Progressing Goal: Respiratory complications will improve Outcome: Progressing Goal: Cardiovascular complication will  be avoided Outcome: Progressing   Problem: Activity: Goal: Risk for activity intolerance will decrease Outcome: Progressing   Problem: Nutrition: Goal: Adequate nutrition will be maintained Outcome: Progressing   Problem: Coping: Goal: Level of anxiety will decrease Outcome: Progressing   Problem: Elimination: Goal: Will not experience complications related to bowel motility Outcome: Progressing Goal: Will not experience complications related to urinary retention Outcome: Progressing   Problem: Pain Management: Goal: General experience of comfort will improve Outcome: Progressing   Problem: Safety: Goal: Ability to remain free from injury will improve Outcome: Progressing   Problem: Skin Integrity: Goal: Risk for impaired skin integrity will decrease Outcome: Progressing   Problem: Education: Goal: Knowledge of disease or condition will improve Outcome: Progressing Goal: Knowledge of secondary prevention will improve (MUST DOCUMENT ALL) Outcome: Progressing Goal: Knowledge of patient specific risk factors will improve Loraine Leriche N/A or DELETE if not current risk factor) Outcome: Progressing   Problem: Ischemic Stroke/TIA Tissue Perfusion: Goal: Complications of ischemic stroke/TIA will be minimized Outcome: Progressing   Problem: Coping: Goal: Will verbalize positive feelings about self Outcome: Progressing Goal: Will identify appropriate support needs Outcome: Progressing   Problem: Health Behavior/Discharge Planning: Goal: Ability to manage health-related needs will improve Outcome: Progressing Goal: Goals will be collaboratively established with patient/family Outcome: Progressing   Problem: Self-Care: Goal: Ability to participate in self-care as condition permits will improve Outcome: Progressing Goal: Verbalization of feelings and concerns over difficulty with self-care will improve Outcome: Progressing Goal: Ability to communicate needs accurately will  improve Outcome: Progressing   Problem: Nutrition: Goal: Risk of aspiration will decrease Outcome: Progressing Goal: Dietary intake will improve Outcome: Progressing   Problem: Education: Goal: Ability to describe self-care measures that may prevent or decrease complications (Diabetes Survival Skills Education) will improve Outcome: Progressing Goal: Individualized Educational Video(s) Outcome: Progressing   Problem: Cardiac: Goal: Ability to maintain an adequate cardiac output will improve Outcome: Progressing   Problem: Health Behavior/Discharge Planning: Goal: Ability to identify and utilize available resources and services will improve Outcome: Progressing Goal: Ability to  manage health-related needs will improve Outcome: Progressing   Problem: Fluid Volume: Goal: Ability to achieve a balanced intake and output will improve Outcome: Progressing   Problem: Metabolic: Goal: Ability to maintain appropriate glucose levels will improve Outcome: Progressing

## 2023-08-01 NOTE — Progress Notes (Signed)
Pt admitted to rm 8 from [place]. Initiated tele. Oriented pt to the unit. Vss. Call bell within reach.   Lawson Radar, RN

## 2023-08-01 NOTE — Plan of Care (Signed)
  Problem: Skin Integrity: Goal: Risk for impaired skin integrity will decrease Outcome: Progressing   Problem: Tissue Perfusion: Goal: Adequacy of tissue perfusion will improve Outcome: Progressing   Problem: Pain Management: Goal: General experience of comfort will improve Outcome: Progressing

## 2023-08-01 NOTE — Progress Notes (Addendum)
STROKE TEAM PROGRESS NOTE   SUBJECTIVE (INTERVAL HISTORY) No family is at the bedside. Pt is awake alert and orientated, mental status much improved from yesterday. Had PRBC transfusion yesterday afternoon and now Hb stable. IR Rose for TIPS next Monday.    OBJECTIVE Temp:  [97.8 F (36.6 C)-99.1 F (37.3 C)] 97.8 F (36.6 C) (12/25 1124) Pulse Rate:  [82-103] 94 (12/25 1200) Cardiac Rhythm: Normal sinus rhythm (12/25 0800) Resp:  [11-25] 22 (12/25 1200) BP: (88-149)/(38-126) 149/126 (12/25 1200) SpO2:  [92 %-99 %] 96 % (12/25 1200) Weight:  [86.7 kg] 86.7 kg (12/25 0430)  Recent Labs  Lab 07/31/23 1918 07/31/23 2315 08/01/23 0327 08/01/23 0736 08/01/23 1058  GLUCAP 168* 146* 106* 139* 208*   Recent Labs  Lab 07/30/23 1219 07/30/23 1508 07/31/23 0222 07/31/23 0935 07/31/23 1355 07/31/23 2014 07/31/23 2140 08/01/23 0315 08/01/23 0317  NA 131*   < > 133*  --  139 141 140 137  --   K 4.3   < > 4.3  --  3.7 3.8 3.5 3.4*  --   CL 103  --  105  --  110 110 111 108  --   CO2 18*  --  15*  --  20* 20* 23 23  --   GLUCOSE 433*  --  472*  --  153* 164* 145* 104*  --   BUN 13  --  34*  --  25* 25* 23 24*  --   CREATININE 0.74  --  0.77  --  0.58 0.58 0.62 0.59  --   CALCIUM 8.5*  --  7.5*  --  7.8* 7.9* 7.6* 7.4*  --   MG 1.8  --   --  1.7  --   --   --   --  2.0  PHOS  --   --   --  3.5  --   --   --   --   --    < > = values in this interval not displayed.   Recent Labs  Lab 07/30/23 1219 07/31/23 0222  AST 29 43*  ALT 24 34  ALKPHOS 59 35*  BILITOT 0.7 0.6  PROT 6.0* 3.9*  ALBUMIN 3.2* 2.2*   Recent Labs  Lab 07/30/23 1219 07/30/23 1508 07/31/23 0222 07/31/23 0935 07/31/23 1355 07/31/23 2014 08/01/23 0315  WBC 19.7*  --  15.7* 16.7*  --   --   --   NEUTROABS  --   --  10.7*  --   --   --   --   HGB 10.3*   < > 5.3* 9.1* 8.4* 7.2* 8.3*  HCT 36.1   < > 18.5* 28.4* 25.6* 22.7* 24.7*  MCV 66.6*  --  66.1* 73.6*  --   --   --   PLT 188  --  155 135*  --    --   --    < > = values in this interval not displayed.   Recent Labs  Lab 07/30/23 1444  CKTOTAL 69   Recent Labs    07/31/23 0222  LABPROT 16.9*  INR 1.4*   Recent Labs    07/30/23 1219 07/31/23 0636  COLORURINE YELLOW YELLOW  LABSPEC 1.031* 1.025  PHURINE 5.0 5.0  GLUCOSEU >=500* >=500*  HGBUR NEGATIVE LARGE*  BILIRUBINUR NEGATIVE NEGATIVE  KETONESUR NEGATIVE 20*  PROTEINUR 100* NEGATIVE  NITRITE NEGATIVE NEGATIVE  LEUKOCYTESUR NEGATIVE NEGATIVE       Component Value Date/Time   CHOL 104  07/31/2023 0222   TRIG 89 07/31/2023 0222   HDL 23 (L) 07/31/2023 0222   CHOLHDL 4.5 07/31/2023 0222   VLDL 18 07/31/2023 0222   LDLCALC 63 07/31/2023 0222   Lab Results  Component Value Date   HGBA1C 11.4 (H) 07/30/2023   No results found for: "LABOPIA", "COCAINSCRNUR", "LABBENZ", "AMPHETMU", "THCU", "LABBARB"  No results for input(s): "ETH" in the last 168 hours.  I have personally reviewed the radiological images below and agree with the radiology interpretations.  CT ANGIO ABD/PELVIS BRTO Result Date: 08/01/2023 CLINICAL DATA:  Gastric varices, with liver cirrhosis on the recent CT. EXAM: CTA ABDOMEN AND PELVIS WITHOUT AND WITH CONTRAST TECHNIQUE: Multidetector CT imaging of the abdomen and pelvis was performed using the standard protocol during bolus administration of intravenous contrast. Multiplanar reconstructed images and MIPs were obtained and reviewed to evaluate the vascular anatomy. RADIATION DOSE REDUCTION: This exam was performed according to the departmental dose-optimization program which includes automated exposure control, adjustment of the mA and/or kV according to patient size and/or use of iterative reconstruction technique. CONTRAST:  75mL OMNIPAQUE IOHEXOL 350 MG/ML SOLN COMPARISON:  CT chest, abdomen and pelvis with IV contrast, done for blunt polytrauma, yesterday at 1:29 p.m. FINDINGS: VASCULAR Aorta: There are moderate to heavy aortic mixed plaques  without aneurysm, stenosis or dissection, or penetrating aortic ulcer. Celiac: Patent without evidence of aneurysm, dissection, vasculitis or significant stenosis. SMA: 40% non flow-limiting soft plaque vessel origin stenosis. The vessel otherwise opacifies well. No branch occlusion. Renals: There are calcifications at both renal artery origins but no flow-limiting stenosis, aneurysm or dissection. Both renal arteries are single. IMA: There is a 75% mixed plaque vessel origin stenosis. The vessel otherwise opacify as well. Inflow: There are moderate calcific plaques especially of the common iliac and internal iliac arteries, but no flow-limiting stenosis. Proximal Outflow: There are calcific/mixed plaques in both common femoral arteries, right proximal superficial femoral artery, but no flow-limiting stenosis of the visualized outflow arteries. Veins: Patent. Mild prominence of the hepatic portal main vein measuring 14.5 mm. No deep pelvic vein or IVC filling defects. In the venous phase, venous varices are again shown along the posterior proximal gastric wall, with splenorenal varices posterior to the. There is no recanalization of the umbilical vein. There are no distal esophageal varices. Review of the MIP images confirms the above findings. NON-VASCULAR Lower chest: There is subpleural reticulation and scattered subpleural bronchiolectasis in the lung bases with mild peripheral faint ground-glass disease, findings consistent with chronic interstitial lung disease. The cardiac size is normal. The inferior mitral ring is heavily calcified. No pericardial effusion. The cardiac blood pool is less dense than the myocardium suggesting anemia. Hepatobiliary: 20 cm in length, mildly steatotic, with nodular capsular contour consistent with cirrhosis. No enhancing liver masses seen. There is excreted contrast filling the gallbladder. There are no stones or wall thickening but there is interval new pericholecystic edema  and trace pericholecystic fluid. In this case it is probably congestive or related to fluid overload given the hepatic cirrhosis, but please correlate clinically for acalculous cholecystitis. There is no bile duct dilatation. Pancreas: No abnormality. Spleen: Mildly prominent spleen 13.8 cm craniocaudal. No focal lesion. Mildly engorged splenic vein. Adrenals/Urinary Tract: No abnormality. Stomach/Bowel: Contracted stomach, unremarkable apart from the gastric varices. Normal caliber small intestine and appendix. Mild wall thickening has developed in the ascending, transverse and proximal descending colon. There is sigmoid diverticulosis but no evidence of diverticulitis. The wall thickening could be congestive, related to hepatic  dysfunction, or due to colitis. Stranding changes have also developed over portions of the thickened colon. No vascular contrast extravasation is seen into the GI tract. Lymphatic: No adenopathy. Reproductive: Uterus and bilateral adnexa are unremarkable. Other: No abdominal wall hernia or abnormality. No abdominopelvic ascites. Musculoskeletal: Levoscoliosis and advanced degenerative change lumbar spine. Most advanced at L1-2 through L3-4 with acquired spinal stenosis L3-4 and L4-5. There is osteopenia and additional degenerative change in the thoracic spine. IMPRESSION: 1. Aortic and branch vessel atherosclerosis without aneurysm, dissection or flow-limiting aortic stenosis. 2. 40% non flow-limiting soft plaque stenosis of the SMA. 3. 75% mixed plaque stenosis of the IMA. 4. Chronic interstitial lung disease in the lung bases. 5. Likely anemia. 6. Hepatic cirrhosis, with mildly prominent hepatic portal main vein and splenic vein, and gastric and splenorenal varices. No recanalization of the umbilical vein or distal esophageal varices. 7. Mildly prominent spleen. 8. Interval development of pericholecystic edema and trace pericholecystic fluid. In this case it is probably congestive or  related to fluid overload given the hepatic cirrhosis, but please correlate clinically for acalculous cholecystitis. 9. Interval development of mild wall thickening in the ascending, transverse and proximal descending colon, with stranding changes over portions of the thickened colon. This could be congestive, related to hepatic dysfunction, or due to colitis. 10. Diverticulosis without evidence of diverticulitis. 11. Osteopenia, lumbar scoliosis, and degenerative change. Aortic Atherosclerosis (ICD10-I70.0). Electronically Signed   By: Almira Bar M.D.   On: 08/01/2023 07:24   DG Chest Port 1 View Result Date: 08/01/2023 CLINICAL DATA:  [address] with respiratory failure. EXAM: PORTABLE CHEST 1 VIEW COMPARISON:  Chest CT 07/30/2023 FINDINGS: The cardiomediastinal silhouette is normal. There is aortic atherosclerosis. There is subpleural reticulation with a basal gradient. No active infiltrate is seen. No substantial pleural effusion. Osteopenia with no acute skeletal findings. No consolidation or other change in overall aeration is seen. IMPRESSION: 1. No evidence of acute chest disease. 2. Aortic atherosclerosis. 3. Subpleural reticulation with a basal gradient. Electronically Signed   By: Almira Bar M.D.   On: 08/01/2023 06:57   ECHOCARDIOGRAM COMPLETE BUBBLE STUDY Result Date: 07/31/2023    ECHOCARDIOGRAM REPORT   Patient Name:   Maria Rose Date of Exam: 07/31/2023 Medical Rec #:  [id number]       Height:       66.0 in Accession #:    [id number]      Weight:       180.0 lb Date of Birth:  1956-01-14        BSA:          1.912 m Patient Age:    67 years        BP:           124/51 mmHg Patient Gender: F               HR:           105 bpm. Exam Location:  Inpatient Procedure: 2D Echo, Cardiac Doppler, Color Doppler and Saline Contrast Bubble            Study Indications:    Stroke  History:        Patient has no prior history of Echocardiogram examinations.                 Risk Factors:Current  Smoker.  Sonographer:    Karma Ganja Referring Phys: [id number] JEFFREY C HATCHER  Sonographer Comments: Technically challenging study due to limited acoustic windows. Image acquisition challenging  due to patient body habitus and Image acquisition challenging due to uncooperative patient. IMPRESSIONS  1. Left ventricular ejection fraction, by estimation, is 60 to 65%. The left ventricle has normal function. The left ventricle has no regional wall motion abnormalities. Left ventricular diastolic parameters are consistent with Grade I diastolic dysfunction (impaired relaxation).  2. Right ventricular systolic function is normal. The right ventricular size is normal.  3. Suboptimal bubble study- no shunting seen.  4. The mitral valve is degenerative. No evidence of mitral valve regurgitation.  5. The aortic valve was not well visualized. Aortic valve regurgitation is not visualized. No aortic stenosis is present. Comparison(s): No prior Echocardiogram. FINDINGS  Left Ventricle: Left ventricular ejection fraction, by estimation, is 60 to 65%. The left ventricle has normal function. The left ventricle has no regional wall motion abnormalities. The left ventricular internal cavity size was normal in size. There is  no left ventricular hypertrophy. Left ventricular diastolic parameters are consistent with Grade I diastolic dysfunction (impaired relaxation). Right Ventricle: The right ventricular size is normal. No increase in right ventricular wall thickness. Right ventricular systolic function is normal. Left Atrium: Left atrial size was normal in size. Right Atrium: Right atrial size was normal in size. Pericardium: There is no evidence of pericardial effusion. Presence of epicardial fat layer. Mitral Valve: The mitral valve is degenerative in appearance. Mild mitral annular calcification. No evidence of mitral valve regurgitation. MV peak gradient, 4.0 mmHg. The mean mitral valve gradient is 2.0 mmHg. Tricuspid Valve: The  tricuspid valve is grossly normal. Tricuspid valve regurgitation is not demonstrated. Aortic Valve: The aortic valve was not well visualized. Aortic valve regurgitation is not visualized. No aortic stenosis is present. Aortic valve mean gradient measures 5.0 mmHg. Aortic valve peak gradient measures 9.9 mmHg. Aortic valve area, by VTI measures 2.26 cm. Pulmonic Valve: The pulmonic valve was not well visualized. Pulmonic valve regurgitation is not visualized. No evidence of pulmonic stenosis. Aorta: The aortic root is normal in size and structure. IAS/Shunts: The interatrial septum was not well visualized. Agitated saline contrast was given intravenously to evaluate for intracardiac shunting.  LEFT VENTRICLE PLAX 2D LVIDd:         5.00 cm   Diastology LVIDs:         3.30 cm   LV e' medial:    7.77 cm/s LV PW:         1.10 cm   LV E/e' medial:  10.4 LV IVS:        1.10 cm   LV e' lateral:   6.53 cm/s LVOT diam:     2.00 cm   LV E/e' lateral: 12.3 LV SV:         53 LV SV Index:   28 LVOT Area:     3.14 cm  RIGHT VENTRICLE RV S prime:     15.90 cm/s TAPSE (M-mode): 2.7 cm LEFT ATRIUM             Index LA diam:        4.30 cm 2.25 cm/m LA Vol (A2C):   37.1 ml 19.40 ml/m LA Vol (A4C):   53.2 ml 27.82 ml/m LA Biplane Vol: 44.0 ml 23.01 ml/m  AORTIC VALVE AV Area (Vmax):    2.28 cm AV Area (Vmean):   2.13 cm AV Area (VTI):     2.26 cm AV Vmax:           157.00 cm/s AV Vmean:  103.000 cm/s AV VTI:            0.234 m AV Peak Grad:      9.9 mmHg AV Mean Grad:      5.0 mmHg LVOT Vmax:         114.00 cm/s LVOT Vmean:        69.800 cm/s LVOT VTI:          0.168 m LVOT/AV VTI ratio: 0.72  AORTA Ao Root diam: 3.40 cm MITRAL VALVE MV Area (PHT): 4.21 cm     SHUNTS MV Area VTI:   2.96 cm     Systemic VTI:  0.17 m MV Peak grad:  4.0 mmHg     Systemic Diam: 2.00 cm MV Mean grad:  2.0 mmHg MV Vmax:       1.00 m/s MV Vmean:      70.0 cm/s MV Decel Time: 180 msec MV E velocity: 80.60 cm/s MV A velocity: 113.00 cm/s MV  E/A ratio:  0.71 Riley Lam MD Electronically signed by Riley Lam MD Signature Date/Time: 07/31/2023/2:39:51 PM    Final    CT ANGIO HEAD NECK W WO CM Result Date: 07/31/2023 CLINICAL DATA:  Stroke/TIA, determine embolic source. EXAM: CT ANGIOGRAPHY HEAD AND NECK WITH AND WITHOUT CONTRAST TECHNIQUE: Multidetector CT imaging of the head and neck was performed using the standard protocol during bolus administration of intravenous contrast. Multiplanar CT image reconstructions and MIPs were obtained to evaluate the vascular anatomy. Carotid stenosis measurements (when applicable) are obtained utilizing NASCET criteria, using the distal internal carotid diameter as the denominator. RADIATION DOSE REDUCTION: This exam was performed according to the departmental dose-optimization program which includes automated exposure control, adjustment of the mA and/or kV according to patient size and/or use of iterative reconstruction technique. CONTRAST:  75mL OMNIPAQUE IOHEXOL 350 MG/ML SOLN COMPARISON:  Head CT and MRI 07/30/2023 FINDINGS: CT HEAD FINDINGS Brain: Progressively well-defined cytotoxic edema is present at the junction of the right temporal, occipital, and parietal lobes corresponding to the known acute/early subacute infarct and extending over an area of 4.5-5 cm in diameter, possibly with trace associated petechial hemorrhage. The additional scattered punctate acute cerebral and cerebellar infarcts on MRI are occult by CT. No space-occupying parenchymal hematoma, mass, midline shift, or extra-axial fluid collection is identified. There is a background of mild chronic small vessel ischemia in the cerebral white matter. The ventricles are normal in size. Vascular: Calcified atherosclerosis at the skull base. Skull: No acute fracture or suspicious osseous lesion. Sinuses/Orbits: Paranasal sinuses and mastoid air cells are clear. Unremarkable orbits. Other: None. Review of the MIP images  confirms the above findings CTA NECK FINDINGS Aortic arch: Standard branching with a moderate amount of mixed soft and calcified atherosclerotic plaque. No significant stenosis of the arch vessel origins. Right carotid system: Patent with predominantly calcified plaque at the carotid bifurcation. No definite evidence of a significant stenosis allowing for streak artifact. Left carotid system: Patent with mixed calcified and soft plaque at the carotid bifurcation. No evidence of a significant stenosis. Vertebral arteries: Patent without evidence of a significant stenosis or dissection. Skeleton: Moderate cervical spondylosis. Other neck: No evidence of cervical lymphadenopathy or mass. Upper chest: More fully evaluated on yesterday's chest CT. Review of the MIP images confirms the above findings CTA HEAD FINDINGS Anterior circulation: The internal carotid arteries are patent from skull base to carotid termini with calcified plaque resulting in mild left cavernous and bilateral paraclinoid stenoses. ACAs and MCAs are patent without evidence  of a significant proximal stenosis. There is a mildly decreased number of distal right MCA branch vessels in the region of the dominant recent infarct, however no proximal branch occlusion is evident. No aneurysm is identified. Posterior circulation: The intracranial vertebral arteries are patent to the basilar with the left being mildly dominant. There is mild atherosclerotic irregularity of both V4 segments without significant stenosis. Patent left PICA, right AICA, and bilateral SCA origins are visualized. The basilar artery is widely patent. Posterior communicating arteries are diminutive or absent. Both PCAs are patent without evidence of a significant proximal stenosis. No aneurysm is identified. Venous sinuses: Patent. Anatomic variants: None. Review of the MIP images confirms the above findings IMPRESSION: 1. No large vessel occlusion. 2. Intracranial atherosclerosis with  mild bilateral ICA stenoses. 3. Cervical carotid atherosclerosis without significant stenosis. 4.  Aortic Atherosclerosis (ICD10-I70.0). Electronically Signed   By: Sebastian Ache M.D.   On: 07/31/2023 11:29   EEG adult Result Date: 07/31/2023 Charlsie Quest, MD     07/31/2023 10:18 AM Patient Name: Maria Rose MRN: [id number] Epilepsy Attending: Charlsie Quest Referring Physician/Provider: Milon Dikes, MD Date: 07/31/2023 Duration: 28.18 mins Patient history: 66yo F with an episode of altered mental status getting eeg to evaluate for seizure Level of alertness: Awake, asleep AEDs during EEG study: None Technical aspects: This EEG study was done with scalp electrodes positioned according to the 10-20 International system of electrode placement. Electrical activity was reviewed with band pass filter of 1-70Hz , sensitivity of 7 uV/mm, display speed of 23mm/sec with a 60Hz  notched filter applied as appropriate. EEG data were recorded continuously and digitally stored.  Video monitoring was available and reviewed as appropriate. Description: The posterior dominant rhythm consists of 9 Hz activity of moderate voltage (25-35 uV) seen predominantly in posterior head regions, asymmetric ( right<left) and reactive to eye opening and eye closing. Sleep was characterized by vertex waves, sleep spindles (12 to 14 Hz), maximal frontocentral region.  EEG showed continuous 3-5hz  theta- delta slowing in right posterior quadrant.  Intermittent generalized 3-5Hz  theta- delta slowing was also noted.  Photic driving was not seen during photic stimulation.  Hyperventilation was not not performed. ABNORMALITY -Continuous slow, right posterior quadrant -Intermittent slow, generalized IMPRESSION: This study is suggestive of cortical dysfunction in right posterior quadrant likely secondary to underlying stroke.  Additionally there is mild diffuse encephalopathy.  No seizures or epileptiform discharges were seen throughout the  recording. Charlsie Quest   MR Brain W and Wo Contrast Result Date: 07/30/2023 CLINICAL DATA:  New onset headache EXAM: MRI HEAD WITHOUT AND WITH CONTRAST TECHNIQUE: Multiplanar, multiecho pulse sequences of the brain and surrounding structures were obtained without and with intravenous contrast. CONTRAST:  8mL GADAVIST GADOBUTROL 1 MMOL/ML IV SOLN COMPARISON:  No prior MRI available, correlation is made with CT head 07/30/2023 FINDINGS: Brain: Restricted diffusion with ADC correlate in the right posterior temporal and lateral occipital and parietal cortex (series 3, images 22-33), which is associated with increased T2 hyperintense signal, gyral swelling, and hemosiderin deposition, likely petechial hemorrhage. Additional punctate foci of restricted diffusion with ADC correlate in the right cerebellum (series 3, image 8), left temporal lobe (series 3, image 19), left occipital lobe (series 3, image 22), right occipital lobe (series 3, images 23), right parietal lobe (series 3, images 29-41), right frontal lobe (series 3, image 28 and 30), right thalamus (series 3, image 29), and left frontal lobe (series 3, images 25 and 33-34); these foci are both in the  white matter and cortex. No abnormal parenchymal or meningeal enhancement. No mass, mass effect, or midline shift. No hydrocephalus or extra-axial collection. Pituitary and craniocervical junction within normal limits. Vascular: Normal arterial flow voids. Normal arterial and venous enhancement. Skull and upper cervical spine: Normal marrow signal. Sinuses/Orbits: Clear paranasal sinuses. No acute finding in the orbits. Other: The mastoid air cells are well aerated. IMPRESSION: 1. Acute to early subacute infarct in the right posterior temporal and lateral occipital and parietal cortex, with associated edema and petechial hemorrhage. 2. Additional punctate acute infarcts in the right cerebellum, left temporal lobe, left occipital lobe, right occipital lobe,  right parietal lobe, right frontal lobe, right thalamus, and left frontal lobe. These foci are both in the white matter and cortex. The distribution is suggestive of a central embolic phenomenon. These results will be called to the ordering clinician or representative by the Radiologist Assistant, and communication documented in the PACS or [employer]. Electronically Signed   By: Wiliam Ke M.D.   On: 07/30/2023 22:55   CT CHEST ABDOMEN PELVIS W CONTRAST Result Date: 07/30/2023 CLINICAL DATA:  Trauma EXAM: CT CHEST, ABDOMEN, AND PELVIS WITH CONTRAST TECHNIQUE: Multidetector CT imaging of the chest, abdomen and pelvis was performed following the standard protocol during bolus administration of intravenous contrast. RADIATION DOSE REDUCTION: This exam was performed according to the departmental dose-optimization program which includes automated exposure control, adjustment of the mA and/or kV according to patient size and/or use of iterative reconstruction technique. CONTRAST:  75mL OMNIPAQUE IOHEXOL 350 MG/ML SOLN COMPARISON:  None Available. FINDINGS: CT CHEST FINDINGS Cardiovascular: Severe aortic atherosclerosis. Normal heart size. No pericardial effusion. Mediastinum/Nodes: No enlarged mediastinal, hilar, or axillary lymph nodes. Thyroid gland, trachea, and esophagus demonstrate no significant findings. Lungs/Pleura: Mild centrilobular emphysema. Mild pulmonary fibrosis in a pattern with apical to basal gradient featuring irregular peripheral interstitial opacity and septal thickening with minimal traction bronchiectasis and minimal areas of subpleural bronchiolectasis of the lung bases. Small irregular nodule of the posterior left apex measuring 0.6 cm (series 6, image 69). No pleural effusion or pneumothorax. Musculoskeletal: No chest wall abnormality. No acute osseous findings. CT ABDOMEN PELVIS FINDINGS Hepatobiliary: No solid liver abnormality is seen. Coarse, nodular cirrhotic morphology of the  liver. No gallstones, gallbladder wall thickening, or biliary dilatation. Pancreas: Unremarkable. No pancreatic ductal dilatation or surrounding inflammatory changes. Spleen: Normal in size without significant abnormality. Adrenals/Urinary Tract: Adrenal glands are unremarkable. Kidneys are normal, without renal calculi, solid lesion, or hydronephrosis. Bladder is unremarkable. Stomach/Bowel: Stomach is within normal limits. Appendix appears normal. No evidence of bowel wall thickening, distention, or inflammatory changes. Sigmoid diverticulosis. Vascular/Lymphatic: Severe aortic atherosclerosis. 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 No enlarged abdominal or pelvic lymph nodes. Reproductive: No mass or other abnormality. Other: No abdominal wall hernia or abnormality. No ascites. Musculoskeletal: No acute osseous findings. IMPRESSION: 1. No CT evidence of acute traumatic injury to the chest, abdomen, or pelvis. 2. Mild pulmonary fibrosis in a pattern with apical to basal gradient featuring irregular peripheral interstitial opacity and septal thickening with minimal traction bronchiectasis and minimal areas of subpleural bronchiolectasis of the lung bases. 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 3. Small irregular nodule of the posterior left apex measuring 0.6 cm. Non-contrast chest CT at 6-12 months is recommended. If the nodule is stable at time of repeat CT, then future CT at 18-24 months (from today's scan) is considered optional for low-risk patients, but is  recommended for high-risk patients. This recommendation follows the consensus statement: Guidelines for Management of Incidental Pulmonary Nodules Detected on CT Images: From the Fleischner Society 2017; Radiology 2017; 284:228-243. 4. Cirrhosis. 5. 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 6. Sigmoid diverticulosis without evidence of acute diverticulitis. Aortic Atherosclerosis  (ICD10-I70.0) and Emphysema (ICD10-J43.9). Electronically Signed   By: Jearld Lesch M.D.   On: 07/30/2023 14:18   CT HEAD WO CONTRAST Result Date: 07/30/2023 CLINICAL DATA:  Trauma EXAM: CT HEAD WITHOUT CONTRAST CT CERVICAL SPINE WITHOUT CONTRAST TECHNIQUE: Multidetector CT imaging of the head and cervical spine was performed following the standard protocol without intravenous contrast. Multiplanar CT image reconstructions of the cervical spine were also generated. RADIATION DOSE REDUCTION: This exam was performed according to the departmental dose-optimization program which includes automated exposure control, adjustment of the mA and/or kV according to patient size and/or use of iterative reconstruction technique. COMPARISON:  None Available. FINDINGS: CT HEAD FINDINGS Brain: Cortical based hypodensity of the right parieto-occipital junction measuring 2.4 x 2.2 cm (series 11, image 25, series 9, image 24). No evidence of hemorrhage, hydrocephalus, extra-axial collection or mass effect. Vascular: No hyperdense vessel or unexpected calcification. Skull: Normal. Negative for fracture or focal lesion. Sinuses/Orbits: No acute finding. Other: Soft tissue contusion of the left scalp vertex (series 11, image 7) CT CERVICAL SPINE FINDINGS Alignment: Degenerative straightening of the normal cervical lordosis. Skull base and vertebrae: No acute fracture. No primary bone lesion or focal pathologic process. Soft tissues and spinal canal: No prevertebral fluid or swelling. No visible canal hematoma. Disc levels: Moderate disc space height loss and osteophytosis from C5-C7 Upper chest: Negative. Other: None. IMPRESSION: 1. Cortical based hypodensity of the right parieto-occipital junction measuring 2.4 x 2.2 cm. This is concerning for acute or subacute infarct or alternately underlying mass. Recommend MRI for further evaluation. 2. No evidence of hemorrhage or mass effect. 3. Soft tissue contusion of the left scalp vertex.  4. No fracture or static subluxation of the cervical spine. 5. Moderate disc degenerative disease from C5-C7. Electronically Signed   By: Jearld Lesch M.D.   On: 07/30/2023 14:13   CT CERVICAL SPINE WO CONTRAST Result Date: 07/30/2023 CLINICAL DATA:  Trauma EXAM: CT HEAD WITHOUT CONTRAST CT CERVICAL SPINE WITHOUT CONTRAST TECHNIQUE: Multidetector CT imaging of the head and cervical spine was performed following the standard protocol without intravenous contrast. Multiplanar CT image reconstructions of the cervical spine were also generated. RADIATION DOSE REDUCTION: This exam was performed according to the departmental dose-optimization program which includes automated exposure control, adjustment of the mA and/or kV according to patient size and/or use of iterative reconstruction technique. COMPARISON:  None Available. FINDINGS: CT HEAD FINDINGS Brain: Cortical based hypodensity of the right parieto-occipital junction measuring 2.4 x 2.2 cm (series 11, image 25, series 9, image 24). No evidence of hemorrhage, hydrocephalus, extra-axial collection or mass effect. Vascular: No hyperdense vessel or unexpected calcification. Skull: Normal. Negative for fracture or focal lesion. Sinuses/Orbits: No acute finding. Other: Soft tissue contusion of the left scalp vertex (series 11, image 7) CT CERVICAL SPINE FINDINGS Alignment: Degenerative straightening of the normal cervical lordosis. Skull base and vertebrae: No acute fracture. No primary bone lesion or focal pathologic process. Soft tissues and spinal canal: No prevertebral fluid or swelling. No visible canal hematoma. Disc levels: Moderate disc space height loss and osteophytosis from C5-C7 Upper chest: Negative. Other: None. IMPRESSION: 1. Cortical based hypodensity of the right parieto-occipital junction measuring 2.4 x 2.2 cm. This  is concerning for acute or subacute infarct or alternately underlying mass. Recommend MRI for further evaluation. 2. No evidence of  hemorrhage or mass effect. 3. Soft tissue contusion of the left scalp vertex. 4. No fracture or static subluxation of the cervical spine. 5. Moderate disc degenerative disease from C5-C7. Electronically Signed   By: Jearld Lesch M.D.   On: 07/30/2023 14:13     PHYSICAL EXAM  Temp:  [97.8 F (36.6 C)-99.1 F (37.3 C)] 97.8 F (36.6 C) (12/25 1124) Pulse Rate:  [82-103] 94 (12/25 1200) Resp:  [11-25] 22 (12/25 1200) BP: (88-149)/(38-126) 149/126 (12/25 1200) SpO2:  [92 %-99 %] 96 % (12/25 1200) Weight:  [86.7 kg] 86.7 kg (12/25 0430)  General - Well nourished, well developed, in no apparent distress, mildly pale.  Ophthalmologic - fundi not visualized due to noncooperation.  Cardiovascular - Regular rhythm and rate.  Mental Status -  Level of arousal and orientation to time, place, and person were intact. Language including expression, naming, repetition, comprehension was assessed and found intact. Fund of Knowledge was assessed and was intact.  Cranial Nerves II - XII - II - Visual field intact OU, however, left visual field decreased visual acuity with intermittent simultagnosia. III, IV, VI - Extraocular movements intact. V - Facial sensation intact bilaterally. VII - Facial movement intact bilaterally. VIII - Hearing & vestibular intact bilaterally. X - Palate elevates symmetrically. XI - Chin turning & shoulder shrug intact bilaterally. XII - Tongue protrusion intact.  Motor Strength - The patient's strength was normal in all extremities and pronator drift was absent.  Bulk was normal and fasciculations were absent.   Motor Tone - Muscle tone was assessed at the neck and appendages and was normal.  Reflexes - The patient's reflexes were symmetrical in all extremities and she had no pathological reflexes.  Sensory - Light touch, temperature/pinprick were assessed and were symmetrical.    Coordination - The patient had normal movements in the hands and feet with no  ataxia or dysmetria.  Tremor was absent.  Gait and Station - deferred.    ASSESSMENT/PLAN Ms. Maria Rose is a 67 y.o. female with history of nasal cellulitis on Abx admitted for SOB, syncope, hyperglycemia, speech difficulty, AMS with elevated lactic acid. No tPA given due to outside window.    Stroke:  bilateral embolic shower, embolic pattern, etiology unclear, needs to rule out endocarditis CT right parietooccipital infarct CTA head and neck mild bilateral ICA stenoses.  MRI  Acute to early subacute infarct in the right posterior temporal and lateral occipital and parietal cortex, with associated edema and petechial hemorrhage. Additional punctate acute infarcts in the right cerebellum, left temporal lobe, left occipital lobe, right occipital lobe, right parietal lobe, right frontal lobe, right thalamus, and left frontal lobe. These foci are both in the white matter and cortex. 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 On octreotide, PPI IV and empiric Abx  IR Rose  for TIPS on Monday  Severe anemia  S/p PRBC infusion Hb 10.3->5.3->PRBC->9.1->7.2->PRBC->8.3 GI on board, EGD showed gastric and duodenum bleeding S/p hemostatic spray Close monitoring  Diabetes, new diagnosis HgbA1c 11.4 goal < 7.0 Currently on insulin drip CBG monitoring SSI DM education and close PCP follow  up  Leukocytosis Elevated lactic acid WBC 19.7->15.7->16.7 Afebrile Elevated LA UA neg Blood culture NGTD Concerning for sepsis, on Rocephin, flagyl and vancomycin -> Rocephine Needs TEE to rule out endocarditis  BP management BP Stable Avoid low BP Long term BP goal normotensive  Lipid management Home meds:  none  LDL 63, goal < 70 no statin needed given LDL at goal  Other Stroke Risk Factors Advanced age  Other Active Problems Mild thrombocytopenia - 155->135  Hospital day # 1   Maria Plan, MD PhD Stroke Neurology 08/01/2023 12:44 PM    To contact Stroke Continuity provider, please refer to [email or web address]. After hours, contact General Neurology

## 2023-08-02 ENCOUNTER — Encounter (HOSPITAL_COMMUNITY): Payer: Self-pay | Admitting: Gastroenterology

## 2023-08-02 ENCOUNTER — Inpatient Hospital Stay (HOSPITAL_COMMUNITY): Payer: Commercial Managed Care - PPO

## 2023-08-02 ENCOUNTER — Encounter (HOSPITAL_COMMUNITY)
Admission: EM | Disposition: A | Discharge status: Home / Self Care | Payer: Self-pay | Source: Home / Self Care | Attending: Internal Medicine

## 2023-08-02 DIAGNOSIS — I8501 Esophageal varices with bleeding: Secondary | ICD-10-CM

## 2023-08-02 DIAGNOSIS — R55 Syncope and collapse: Secondary | ICD-10-CM | POA: Diagnosis not present

## 2023-08-02 DIAGNOSIS — I8500 Esophageal varices without bleeding: Secondary | ICD-10-CM

## 2023-08-02 DIAGNOSIS — R6510 Systemic inflammatory response syndrome (SIRS) of non-infectious origin without acute organ dysfunction: Secondary | ICD-10-CM | POA: Diagnosis not present

## 2023-08-02 DIAGNOSIS — R739 Hyperglycemia, unspecified: Secondary | ICD-10-CM | POA: Diagnosis not present

## 2023-08-02 DIAGNOSIS — D735 Infarction of spleen: Secondary | ICD-10-CM | POA: Diagnosis not present

## 2023-08-02 DIAGNOSIS — K7460 Unspecified cirrhosis of liver: Secondary | ICD-10-CM

## 2023-08-02 LAB — GLUCOSE, CAPILLARY
Glucose-Capillary: 149 mg/dL — ABNORMAL HIGH (ref 70–99)
Glucose-Capillary: 135 mg/dL — ABNORMAL HIGH (ref 70–99)
Glucose-Capillary: 172 mg/dL — ABNORMAL HIGH (ref 70–99)
Glucose-Capillary: 157 mg/dL — ABNORMAL HIGH (ref 70–99)
Glucose-Capillary: 177 mg/dL — ABNORMAL HIGH (ref 70–99)

## 2023-08-02 LAB — CBC
HCT: 23.5 % — ABNORMAL LOW (ref 36.0–46.0)
Hemoglobin: 7.9 g/dL — ABNORMAL LOW (ref 12.0–15.0)
MCH: 25.5 pg — ABNORMAL LOW (ref 26.0–34.0)
MCHC: 33.6 g/dL (ref 30.0–36.0)
MCV: 75.8 fL — ABNORMAL LOW (ref 80.0–100.0)
Platelets: 87 10*3/uL — ABNORMAL LOW (ref 150–400)
RBC: 3.1 MIL/uL — ABNORMAL LOW (ref 3.87–5.11)
RDW: 26.3 % — ABNORMAL HIGH (ref 11.5–15.5)
WBC: 8.3 10*3/uL (ref 4.0–10.5)
nRBC: 0.2 % (ref 0.0–0.2)

## 2023-08-02 LAB — IGG: IgG (Immunoglobin G), Serum: 467 mg/dL — ABNORMAL LOW (ref 586–1602)

## 2023-08-02 SURGERY — TRANSESOPHAGEAL ECHOCARDIOGRAM (TEE) (CATHLAB)
Anesthesia: Monitor Anesthesia Care

## 2023-08-02 MED ORDER — MUSCLE RUB 10-15 % EX CREA
TOPICAL_CREAM | Freq: Four times a day (QID) | CUTANEOUS | Status: DC | PRN
  Filled 2023-08-02: qty 85

## 2023-08-02 NOTE — Progress Notes (Signed)
HD#2 SUBJECTIVE:  Patient Summary: Maria Rose is a 67 YO F with no past medical history presenting to the ED with a three day history of headache and syncopal episode and admitted for stroke workup.  Overnight Events: NAEO   Interim History: Patient was evaluated bedside.  Denies any chest pain, shortness of breath, troubles breathing, abdominal pain, melena, or hematochezia.  Discussed plans for TEE today.  No other concerns at this time.  OBJECTIVE:  Vital Signs: Vitals:   08/01/23 1942 08/01/23 2337 08/02/23 0318 08/02/23 0738  BP: (!) 133/57 (!) 132/56 131/61 136/70  Pulse: 86 93 90 85  Resp: 17 16 16 16   Temp: 98.6 F (37 C) 98 F (36.7 C) 98.2 F (36.8 C) 97.6 F (36.4 C)  TempSrc: Oral Oral Oral Oral  SpO2: 95% 95% 93% 94%  Weight:      Height:       Supplemental O2: Room Air SpO2: 94 % O2 Flow Rate (L/min): 1 L/min  Filed Weights   07/30/23 1132 08/01/23 0430  Weight: 81.6 kg 86.7 kg     Intake/Output Summary (Last 24 hours) at 08/02/2023 0951 Last data filed at 08/02/2023 0740 Gross per 24 hour  Intake 836.17 ml  Output 3100 ml  Net -2263.83 ml   Net IO Since Admission: 5,135.38 mL [08/02/23 0951]  CBC    Component Value Date/Time   WBC 8.3 08/02/2023 0303   RBC 3.10 (L) 08/02/2023 0303   HGB 7.9 (L) 08/02/2023 0303   HCT 23.5 (L) 08/02/2023 0303   PLT 87 (L) 08/02/2023 0303   MCV 75.8 (L) 08/02/2023 0303   MCH 25.5 (L) 08/02/2023 0303   MCHC 33.6 08/02/2023 0303   RDW 26.3 (H) 08/02/2023 0303   LYMPHSABS 3.9 07/31/2023 0222   MONOABS 0.9 07/31/2023 0222   EOSABS 0.0 07/31/2023 0222   BASOSABS 0.0 07/31/2023 0222   CMP     Component Value Date/Time   NA 137 08/01/2023 0315   K 3.4 (L) 08/01/2023 0315   CL 108 08/01/2023 0315   CO2 23 08/01/2023 0315   GLUCOSE 104 (H) 08/01/2023 0315   BUN 24 (H) 08/01/2023 0315   CREATININE 0.59 08/01/2023 0315   CALCIUM 7.4 (L) 08/01/2023 0315   PROT 3.9 (L) 07/31/2023 0222   ALBUMIN 2.2 (L)  07/31/2023 0222   AST 43 (H) 07/31/2023 0222   ALT 34 07/31/2023 0222   ALKPHOS 35 (L) 07/31/2023 0222   BILITOT 0.6 07/31/2023 0222   GFRNONAA >60 08/01/2023 0315    Physical Exam: Physical Exam Constitutional:      Appearance: Normal appearance.  HENT:     Head: Normocephalic and atraumatic.  Cardiovascular:     Rate and Rhythm: Normal rate and regular rhythm.     Pulses: Normal pulses.     Heart sounds: Normal heart sounds.  Pulmonary:     Effort: Pulmonary effort is normal.     Breath sounds: Normal breath sounds.  Abdominal:     General: Bowel sounds are normal.  Neurological:     General: No focal deficit present.     Mental Status: She is alert and oriented to person, place, and time.    ASSESSMENT/PLAN:  Assessment: Principal Problem:   Sepsis (HCC) Active Problems:   Syncope and collapse   Hyperglycemia   SIRS (systemic inflammatory response syndrome) (HCC)   Hepatic cirrhosis (HCC)   GI bleed   Gastric varices   Splenic varices   Cerebrovascular accident (CVA) (HCC)  Hyperosmolar hyperglycemic state (HHS) (HCC)   Plan: #Stroke - bilateral embolic shower with unclear etiology Patient was not on antithrombotic therapy prior to admission and remains no on anticoagulation due to severe anemia and needing to rule out endocarditis.  Was initially scheduled for a TEE today for endocarditis rule out. Canceled by Dr. Tenny Craw as she feels that the timing for this can wait. 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  Remains on octreotide, IV PPI, and ceftriaxone.  Hemoglobin at 7.9 this morning from 8.3 yesterday.  Denies any blood in her stool.  Scheduled for elective TIPS by IR on Monday - Continue ceftriaxone (last dose on 12/29) - Continue octreotide (last dose on 12/27) - Continue PPI  - F/U CBC  #Leukocytosis, resolved WBC  8.3 this morning. Denies any fever, chills, or night sweats.  - F/U CBC   #T2DM Remains well controlled. Continue SSI and 12 units Semglee.  - Trend CBG  Best Practice: Diet: Cardiac diet VTE: SCDs Start: 07/30/23 1620 Code: Full AB: CTX Therapy Recs: CIR DISPO: Anticipated discharge  TBD  to  CIR  pending  stroke workup and TIPS .  Signature: Morrie Sheldon, MD Internal Medicine Resident, PGY-1 [facility]  Pager: [phone]  Please contact the on call pager after 5 pm and on weekends at [phone].

## 2023-08-02 NOTE — Progress Notes (Signed)
STROKE TEAM PROGRESS NOTE   SUBJECTIVE (INTERVAL HISTORY) Husband is at the bedside. Pt is awake alert and orientated, less pale and lips are red today. TEE cancelled today, cardiology would like to discuss with GI and primary team about the timing. IR plan for TIPS next Monday.    OBJECTIVE Temp:  [97.6 F (36.4 C)-98.6 F (37 C)] 98.5 F (36.9 C) (12/26 1134) Pulse Rate:  [85-98] 86 (12/26 1134) Cardiac Rhythm: Normal sinus rhythm (12/26 0812) Resp:  [16-20] 18 (12/26 1134) BP: (131-136)/(55-96) 134/96 (12/26 1134) SpO2:  [93 %-97 %] 97 % (12/26 1134)  Recent Labs  Lab 08/01/23 1941 08/01/23 2335 08/02/23 0314 08/02/23 0738 08/02/23 1133  GLUCAP 201* 165* 149* 135* 172*   Recent Labs  Lab 07/30/23 1219 07/30/23 1508 07/31/23 0222 07/31/23 0935 07/31/23 1355 07/31/23 2014 07/31/23 2140 08/01/23 0315 08/01/23 0317  NA 131*   < > 133*  --  139 141 140 137  --   K 4.3   < > 4.3  --  3.7 3.8 3.5 3.4*  --   CL 103  --  105  --  110 110 111 108  --   CO2 18*  --  15*  --  20* 20* 23 23  --   GLUCOSE 433*  --  472*  --  153* 164* 145* 104*  --   BUN 13  --  34*  --  25* 25* 23 24*  --   CREATININE 0.74  --  0.77  --  0.58 0.58 0.62 0.59  --   CALCIUM 8.5*  --  7.5*  --  7.8* 7.9* 7.6* 7.4*  --   MG 1.8  --   --  1.7  --   --   --   --  2.0  PHOS  --   --   --  3.5  --   --   --   --   --    < > = values in this interval not displayed.   Recent Labs  Lab 07/30/23 1219 07/31/23 0222  AST 29 43*  ALT 24 34  ALKPHOS 59 35*  BILITOT 0.7 0.6  PROT 6.0* 3.9*  ALBUMIN 3.2* 2.2*   Recent Labs  Lab 07/30/23 1219 07/30/23 1508 07/31/23 0222 07/31/23 0935 07/31/23 1355 07/31/23 2014 08/01/23 0315 08/02/23 0303  WBC 19.7*  --  15.7* 16.7*  --   --   --  8.3  NEUTROABS  --   --  10.7*  --   --   --   --   --   HGB 10.3*   < > 5.3* 9.1* 8.4* 7.2* 8.3* 7.9*  HCT 36.1   < > 18.5* 28.4* 25.6* 22.7* 24.7* 23.5*  MCV 66.6*  --  66.1* 73.6*  --   --   --  75.8*  PLT  188  --  155 135*  --   --   --  87*   < > = values in this interval not displayed.   Recent Labs  Lab 07/30/23 1444  CKTOTAL 69   Recent Labs    07/31/23 0222  LABPROT 16.9*  INR 1.4*   Recent Labs    07/31/23 0636  COLORURINE YELLOW  LABSPEC 1.025  PHURINE 5.0  GLUCOSEU >=500*  HGBUR LARGE*  BILIRUBINUR NEGATIVE  KETONESUR 20*  PROTEINUR NEGATIVE  NITRITE NEGATIVE  LEUKOCYTESUR NEGATIVE       Component Value Date/Time   CHOL 104 07/31/2023 0222  TRIG 89 07/31/2023 0222   HDL 23 (L) 07/31/2023 0222   CHOLHDL 4.5 07/31/2023 0222   VLDL 18 07/31/2023 0222   LDLCALC 63 07/31/2023 0222   Lab Results  Component Value Date   HGBA1C 11.4 (H) 07/30/2023   No results found for: "LABOPIA", "COCAINSCRNUR", "LABBENZ", "AMPHETMU", "THCU", "LABBARB"  No results for input(s): "ETH" in the last 168 hours.  I have personally reviewed the radiological images below and agree with the radiology interpretations.  CT ANGIO ABD/PELVIS BRTO Result Date: 08/01/2023 CLINICAL DATA:  Gastric varices, with liver cirrhosis on the recent CT. EXAM: CTA ABDOMEN AND PELVIS WITHOUT AND WITH CONTRAST TECHNIQUE: Multidetector CT imaging of the abdomen and pelvis was performed using the standard protocol during bolus administration of intravenous contrast. Multiplanar reconstructed images and MIPs were obtained and reviewed to evaluate the vascular anatomy. RADIATION DOSE REDUCTION: This exam was performed according to the departmental dose-optimization program which includes automated exposure control, adjustment of the mA and/or kV according to patient size and/or use of iterative reconstruction technique. CONTRAST:  75mL OMNIPAQUE IOHEXOL 350 MG/ML SOLN COMPARISON:  CT chest, abdomen and pelvis with IV contrast, done for blunt polytrauma, yesterday at 1:29 p.m. FINDINGS: VASCULAR Aorta: There are moderate to heavy aortic mixed plaques without aneurysm, stenosis or dissection, or penetrating aortic  ulcer. Celiac: Patent without evidence of aneurysm, dissection, vasculitis or significant stenosis. SMA: 40% non flow-limiting soft plaque vessel origin stenosis. The vessel otherwise opacifies well. No branch occlusion. Renals: There are calcifications at both renal artery origins but no flow-limiting stenosis, aneurysm or dissection. Both renal arteries are single. IMA: There is a 75% mixed plaque vessel origin stenosis. The vessel otherwise opacify as well. Inflow: There are moderate calcific plaques especially of the common iliac and internal iliac arteries, but no flow-limiting stenosis. Proximal Outflow: There are calcific/mixed plaques in both common femoral arteries, right proximal superficial femoral artery, but no flow-limiting stenosis of the visualized outflow arteries. Veins: Patent. Mild prominence of the hepatic portal main vein measuring 14.5 mm. No deep pelvic vein or IVC filling defects. In the venous phase, venous varices are again shown along the posterior proximal gastric wall, with splenorenal varices posterior to the. There is no recanalization of the umbilical vein. There are no distal esophageal varices. Review of the MIP images confirms the above findings. NON-VASCULAR Lower chest: There is subpleural reticulation and scattered subpleural bronchiolectasis in the lung bases with mild peripheral faint ground-glass disease, findings consistent with chronic interstitial lung disease. The cardiac size is normal. The inferior mitral ring is heavily calcified. No pericardial effusion. The cardiac blood pool is less dense than the myocardium suggesting anemia. Hepatobiliary: 20 cm in length, mildly steatotic, with nodular capsular contour consistent with cirrhosis. No enhancing liver masses seen. There is excreted contrast filling the gallbladder. There are no stones or wall thickening but there is interval new pericholecystic edema and trace pericholecystic fluid. In this case it is probably  congestive or related to fluid overload given the hepatic cirrhosis, but please correlate clinically for acalculous cholecystitis. There is no bile duct dilatation. Pancreas: No abnormality. Spleen: Mildly prominent spleen 13.8 cm craniocaudal. No focal lesion. Mildly engorged splenic vein. Adrenals/Urinary Tract: No abnormality. Stomach/Bowel: Contracted stomach, unremarkable apart from the gastric varices. Normal caliber small intestine and appendix. Mild wall thickening has developed in the ascending, transverse and proximal descending colon. There is sigmoid diverticulosis but no evidence of diverticulitis. The wall thickening could be congestive, related to hepatic dysfunction, or due to  colitis. Stranding changes have also developed over portions of the thickened colon. No vascular contrast extravasation is seen into the GI tract. Lymphatic: No adenopathy. Reproductive: Uterus and bilateral adnexa are unremarkable. Other: No abdominal wall hernia or abnormality. No abdominopelvic ascites. Musculoskeletal: Levoscoliosis and advanced degenerative change lumbar spine. Most advanced at L1-2 through L3-4 with acquired spinal stenosis L3-4 and L4-5. There is osteopenia and additional degenerative change in the thoracic spine. IMPRESSION: 1. Aortic and branch vessel atherosclerosis without aneurysm, dissection or flow-limiting aortic stenosis. 2. 40% non flow-limiting soft plaque stenosis of the SMA. 3. 75% mixed plaque stenosis of the IMA. 4. Chronic interstitial lung disease in the lung bases. 5. Likely anemia. 6. Hepatic cirrhosis, with mildly prominent hepatic portal main vein and splenic vein, and gastric and splenorenal varices. No recanalization of the umbilical vein or distal esophageal varices. 7. Mildly prominent spleen. 8. Interval development of pericholecystic edema and trace pericholecystic fluid. In this case it is probably congestive or related to fluid overload given the hepatic cirrhosis, but please  correlate clinically for acalculous cholecystitis. 9. Interval development of mild wall thickening in the ascending, transverse and proximal descending colon, with stranding changes over portions of the thickened colon. This could be congestive, related to hepatic dysfunction, or due to colitis. 10. Diverticulosis without evidence of diverticulitis. 11. Osteopenia, lumbar scoliosis, and degenerative change. Aortic Atherosclerosis (ICD10-I70.0). Electronically Signed   By: Almira Bar M.D.   On: 08/01/2023 07:24   DG Chest Port 1 View Result Date: 08/01/2023 CLINICAL DATA:  [id number] with respiratory failure. EXAM: PORTABLE CHEST 1 VIEW COMPARISON:  Chest CT 07/30/2023 FINDINGS: The cardiomediastinal silhouette is normal. There is aortic atherosclerosis. There is subpleural reticulation with a basal gradient. No active infiltrate is seen. No substantial pleural effusion. Osteopenia with no acute skeletal findings. No consolidation or other change in overall aeration is seen. IMPRESSION: 1. No evidence of acute chest disease. 2. Aortic atherosclerosis. 3. Subpleural reticulation with a basal gradient. Electronically Signed   By: Almira Bar M.D.   On: 08/01/2023 06:57   ECHOCARDIOGRAM COMPLETE BUBBLE STUDY Result Date: 07/31/2023    ECHOCARDIOGRAM REPORT   Patient Name:   Maria Rose Date of Exam: 07/31/2023 Medical Rec #:  [id number]       Height:       66.0 in Accession #:    [id number]      Weight:       180.0 lb Date of Birth:  04-09-1956        BSA:          1.912 m Patient Age:    67 years        BP:           124/51 mmHg Patient Gender: F               HR:           105 bpm. Exam Location:  Inpatient Procedure: 2D Echo, Cardiac Doppler, Color Doppler and Saline Contrast Bubble            Study Indications:    Stroke  History:        Patient has no prior history of Echocardiogram examinations.                 Risk Factors:Current Smoker.  Sonographer:    Karma Ganja Referring Phys: [id number] JEFFREY C  HATCHER  Sonographer Comments: Technically challenging study due to limited acoustic windows. Image acquisition challenging due to patient body  habitus and Image acquisition challenging due to uncooperative patient. IMPRESSIONS  1. Left ventricular ejection fraction, by estimation, is 60 to 65%. The left ventricle has normal function. The left ventricle has no regional wall motion abnormalities. Left ventricular diastolic parameters are consistent with Grade I diastolic dysfunction (impaired relaxation).  2. Right ventricular systolic function is normal. The right ventricular size is normal.  3. Suboptimal bubble study- no shunting seen.  4. The mitral valve is degenerative. No evidence of mitral valve regurgitation.  5. The aortic valve was not well visualized. Aortic valve regurgitation is not visualized. No aortic stenosis is present. Comparison(s): No prior Echocardiogram. FINDINGS  Left Ventricle: Left ventricular ejection fraction, by estimation, is 60 to 65%. The left ventricle has normal function. The left ventricle has no regional wall motion abnormalities. The left ventricular internal cavity size was normal in size. There is  no left ventricular hypertrophy. Left ventricular diastolic parameters are consistent with Grade I diastolic dysfunction (impaired relaxation). Right Ventricle: The right ventricular size is normal. No increase in right ventricular wall thickness. Right ventricular systolic function is normal. Left Atrium: Left atrial size was normal in size. Right Atrium: Right atrial size was normal in size. Pericardium: There is no evidence of pericardial effusion. Presence of epicardial fat layer. Mitral Valve: The mitral valve is degenerative in appearance. Mild mitral annular calcification. No evidence of mitral valve regurgitation. MV peak gradient, 4.0 mmHg. The mean mitral valve gradient is 2.0 mmHg. Tricuspid Valve: The tricuspid valve is grossly normal. Tricuspid valve regurgitation is not  demonstrated. Aortic Valve: The aortic valve was not well visualized. Aortic valve regurgitation is not visualized. No aortic stenosis is present. Aortic valve mean gradient measures 5.0 mmHg. Aortic valve peak gradient measures 9.9 mmHg. Aortic valve area, by VTI measures 2.26 cm. Pulmonic Valve: The pulmonic valve was not well visualized. Pulmonic valve regurgitation is not visualized. No evidence of pulmonic stenosis. Aorta: The aortic root is normal in size and structure. IAS/Shunts: The interatrial septum was not well visualized. Agitated saline contrast was given intravenously to evaluate for intracardiac shunting.  LEFT VENTRICLE PLAX 2D LVIDd:         5.00 cm   Diastology LVIDs:         3.30 cm   LV e' medial:    7.77 cm/s LV PW:         1.10 cm   LV E/e' medial:  10.4 LV IVS:        1.10 cm   LV e' lateral:   6.53 cm/s LVOT diam:     2.00 cm   LV E/e' lateral: 12.3 LV SV:         53 LV SV Index:   28 LVOT Area:     3.14 cm  RIGHT VENTRICLE RV S prime:     15.90 cm/s TAPSE (M-mode): 2.7 cm LEFT ATRIUM             Index LA diam:        4.30 cm 2.25 cm/m LA Vol (A2C):   37.1 ml 19.40 ml/m LA Vol (A4C):   53.2 ml 27.82 ml/m LA Biplane Vol: 44.0 ml 23.01 ml/m  AORTIC VALVE AV Area (Vmax):    2.28 cm AV Area (Vmean):   2.13 cm AV Area (VTI):     2.26 cm AV Vmax:           157.00 cm/s AV Vmean:          103.000 cm/s AV  VTI:            0.234 m AV Peak Grad:      9.9 mmHg AV Mean Grad:      5.0 mmHg LVOT Vmax:         114.00 cm/s LVOT Vmean:        69.800 cm/s LVOT VTI:          0.168 m LVOT/AV VTI ratio: 0.72  AORTA Ao Root diam: 3.40 cm MITRAL VALVE MV Area (PHT): 4.21 cm     SHUNTS MV Area VTI:   2.96 cm     Systemic VTI:  0.17 m MV Peak grad:  4.0 mmHg     Systemic Diam: 2.00 cm MV Mean grad:  2.0 mmHg MV Vmax:       1.00 m/s MV Vmean:      70.0 cm/s MV Decel Time: 180 msec MV E velocity: 80.60 cm/s MV A velocity: 113.00 cm/s MV E/A ratio:  0.71 Riley Lam MD Electronically signed by Riley Lam MD Signature Date/Time: 07/31/2023/2:39:51 PM    Final    CT ANGIO HEAD NECK W WO CM Result Date: 07/31/2023 CLINICAL DATA:  Stroke/TIA, determine embolic source. EXAM: CT ANGIOGRAPHY HEAD AND NECK WITH AND WITHOUT CONTRAST TECHNIQUE: Multidetector CT imaging of the head and neck was performed using the standard protocol during bolus administration of intravenous contrast. Multiplanar CT image reconstructions and MIPs were obtained to evaluate the vascular anatomy. Carotid stenosis measurements (when applicable) are obtained utilizing NASCET criteria, using the distal internal carotid diameter as the denominator. RADIATION DOSE REDUCTION: This exam was performed according to the departmental dose-optimization program which includes automated exposure control, adjustment of the mA and/or kV according to patient size and/or use of iterative reconstruction technique. CONTRAST:  75mL OMNIPAQUE IOHEXOL 350 MG/ML SOLN COMPARISON:  Head CT and MRI 07/30/2023 FINDINGS: CT HEAD FINDINGS Brain: Progressively well-defined cytotoxic edema is present at the junction of the right temporal, occipital, and parietal lobes corresponding to the known acute/early subacute infarct and extending over an area of 4.5-5 cm in diameter, possibly with trace associated petechial hemorrhage. The additional scattered punctate acute cerebral and cerebellar infarcts on MRI are occult by CT. No space-occupying parenchymal hematoma, mass, midline shift, or extra-axial fluid collection is identified. There is a background of mild chronic small vessel ischemia in the cerebral white matter. The ventricles are normal in size. Vascular: Calcified atherosclerosis at the skull base. Skull: No acute fracture or suspicious osseous lesion. Sinuses/Orbits: Paranasal sinuses and mastoid air cells are clear. Unremarkable orbits. Other: None. Review of the MIP images confirms the above findings CTA NECK FINDINGS Aortic arch: Standard branching  with a moderate amount of mixed soft and calcified atherosclerotic plaque. No significant stenosis of the arch vessel origins. Right carotid system: Patent with predominantly calcified plaque at the carotid bifurcation. No definite evidence of a significant stenosis allowing for streak artifact. Left carotid system: Patent with mixed calcified and soft plaque at the carotid bifurcation. No evidence of a significant stenosis. Vertebral arteries: Patent without evidence of a significant stenosis or dissection. Skeleton: Moderate cervical spondylosis. Other neck: No evidence of cervical lymphadenopathy or mass. Upper chest: More fully evaluated on yesterday's chest CT. Review of the MIP images confirms the above findings CTA HEAD FINDINGS Anterior circulation: The internal carotid arteries are patent from skull base to carotid termini with calcified plaque resulting in mild left cavernous and bilateral paraclinoid stenoses. ACAs and MCAs are patent without evidence of a significant  proximal stenosis. There is a mildly decreased number of distal right MCA branch vessels in the region of the dominant recent infarct, however no proximal branch occlusion is evident. No aneurysm is identified. Posterior circulation: The intracranial vertebral arteries are patent to the basilar with the left being mildly dominant. There is mild atherosclerotic irregularity of both V4 segments without significant stenosis. Patent left PICA, right AICA, and bilateral SCA origins are visualized. The basilar artery is widely patent. Posterior communicating arteries are diminutive or absent. Both PCAs are patent without evidence of a significant proximal stenosis. No aneurysm is identified. Venous sinuses: Patent. Anatomic variants: None. Review of the MIP images confirms the above findings IMPRESSION: 1. No large vessel occlusion. 2. Intracranial atherosclerosis with mild bilateral ICA stenoses. 3. Cervical carotid atherosclerosis without  significant stenosis. 4.  Aortic Atherosclerosis (ICD10-I70.0). Electronically Signed   By: Sebastian Ache M.D.   On: 07/31/2023 11:29   EEG adult Result Date: 07/31/2023 Charlsie Quest, MD     07/31/2023 10:18 AM Patient Name: Maria Rose MRN: [id number] Epilepsy Attending: Charlsie Quest Referring Physician/Provider: Milon Dikes, MD Date: 07/31/2023 Duration: 28.18 mins Patient history: 67yo F with an episode of altered mental status getting eeg to evaluate for seizure Level of alertness: Awake, asleep AEDs during EEG study: None Technical aspects: This EEG study was done with scalp electrodes positioned according to the 10-20 International system of electrode placement. Electrical activity was reviewed with band pass filter of 1-70Hz , sensitivity of 7 uV/mm, display speed of 52mm/sec with a 60Hz  notched filter applied as appropriate. EEG data were recorded continuously and digitally stored.  Video monitoring was available and reviewed as appropriate. Description: The posterior dominant rhythm consists of 9 Hz activity of moderate voltage (25-35 uV) seen predominantly in posterior head regions, asymmetric ( right<left) and reactive to eye opening and eye closing. Sleep was characterized by vertex waves, sleep spindles (12 to 14 Hz), maximal frontocentral region.  EEG showed continuous 3-5hz  theta- delta slowing in right posterior quadrant.  Intermittent generalized 3-5Hz  theta- delta slowing was also noted.  Photic driving was not seen during photic stimulation.  Hyperventilation was not not performed. ABNORMALITY -Continuous slow, right posterior quadrant -Intermittent slow, generalized IMPRESSION: This study is suggestive of cortical dysfunction in right posterior quadrant likely secondary to underlying stroke.  Additionally there is mild diffuse encephalopathy.  No seizures or epileptiform discharges were seen throughout the recording. Charlsie Quest   MR Brain W and Wo Contrast Result Date:  07/30/2023 CLINICAL DATA:  New onset headache EXAM: MRI HEAD WITHOUT AND WITH CONTRAST TECHNIQUE: Multiplanar, multiecho pulse sequences of the brain and surrounding structures were obtained without and with intravenous contrast. CONTRAST:  8mL GADAVIST GADOBUTROL 1 MMOL/ML IV SOLN COMPARISON:  No prior MRI available, correlation is made with CT head 07/30/2023 FINDINGS: Brain: Restricted diffusion with ADC correlate in the right posterior temporal and lateral occipital and parietal cortex (series 3, images 22-33), which is associated with increased T2 hyperintense signal, gyral swelling, and hemosiderin deposition, likely petechial hemorrhage. Additional punctate foci of restricted diffusion with ADC correlate in the right cerebellum (series 3, image 8), left temporal lobe (series 3, image 19), left occipital lobe (series 3, image 22), right occipital lobe (series 3, images 23), right parietal lobe (series 3, images 29-41), right frontal lobe (series 3, image 28 and 30), right thalamus (series 3, image 29), and left frontal lobe (series 3, images 25 and 33-34); these foci are both in the white matter and  cortex. No abnormal parenchymal or meningeal enhancement. No mass, mass effect, or midline shift. No hydrocephalus or extra-axial collection. Pituitary and craniocervical junction within normal limits. Vascular: Normal arterial flow voids. Normal arterial and venous enhancement. Skull and upper cervical spine: Normal marrow signal. Sinuses/Orbits: Clear paranasal sinuses. No acute finding in the orbits. Other: The mastoid air cells are well aerated. IMPRESSION: 1. Acute to early subacute infarct in the right posterior temporal and lateral occipital and parietal cortex, with associated edema and petechial hemorrhage. 2. Additional punctate acute infarcts in the right cerebellum, left temporal lobe, left occipital lobe, right occipital lobe, right parietal lobe, right frontal lobe, right thalamus, and left frontal  lobe. These foci are both in the white matter and cortex. The distribution is suggestive of a central embolic phenomenon. These results will be called to the ordering clinician or representative by the Radiologist Assistant, and communication documented in the PACS or [employer]. Electronically Signed   By: Wiliam Ke M.D.   On: 07/30/2023 22:55   CT CHEST ABDOMEN PELVIS W CONTRAST Result Date: 07/30/2023 CLINICAL DATA:  Trauma EXAM: CT CHEST, ABDOMEN, AND PELVIS WITH CONTRAST TECHNIQUE: Multidetector CT imaging of the chest, abdomen and pelvis was performed following the standard protocol during bolus administration of intravenous contrast. RADIATION DOSE REDUCTION: This exam was performed according to the departmental dose-optimization program which includes automated exposure control, adjustment of the mA and/or kV according to patient size and/or use of iterative reconstruction technique. CONTRAST:  75mL OMNIPAQUE IOHEXOL 350 MG/ML SOLN COMPARISON:  None Available. FINDINGS: CT CHEST FINDINGS Cardiovascular: Severe aortic atherosclerosis. Normal heart size. No pericardial effusion. Mediastinum/Nodes: No enlarged mediastinal, hilar, or axillary lymph nodes. Thyroid gland, trachea, and esophagus demonstrate no significant findings. Lungs/Pleura: Mild centrilobular emphysema. Mild pulmonary fibrosis in a pattern with apical to basal gradient featuring irregular peripheral interstitial opacity and septal thickening with minimal traction bronchiectasis and minimal areas of subpleural bronchiolectasis of the lung bases. Small irregular nodule of the posterior left apex measuring 0.6 cm (series 6, image 69). No pleural effusion or pneumothorax. Musculoskeletal: No chest wall abnormality. No acute osseous findings. CT ABDOMEN PELVIS FINDINGS Hepatobiliary: No solid liver abnormality is seen. Coarse, nodular cirrhotic morphology of the liver. No gallstones, gallbladder wall thickening, or biliary dilatation.  Pancreas: Unremarkable. No pancreatic ductal dilatation or surrounding inflammatory changes. Spleen: Normal in size without significant abnormality. Adrenals/Urinary Tract: Adrenal glands are unremarkable. Kidneys are normal, without renal calculi, solid lesion, or hydronephrosis. Bladder is unremarkable. Stomach/Bowel: Stomach is within normal limits. Appendix appears normal. No evidence of bowel wall thickening, distention, or inflammatory changes. Sigmoid diverticulosis. Vascular/Lymphatic: Severe aortic atherosclerosis. 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 No enlarged abdominal or pelvic lymph nodes. Reproductive: No mass or other abnormality. Other: No abdominal wall hernia or abnormality. No ascites. Musculoskeletal: No acute osseous findings. IMPRESSION: 1. No CT evidence of acute traumatic injury to the chest, abdomen, or pelvis. 2. Mild pulmonary fibrosis in a pattern with apical to basal gradient featuring irregular peripheral interstitial opacity and septal thickening with minimal traction bronchiectasis and minimal areas of subpleural bronchiolectasis of the lung bases. 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 3. Small irregular nodule of the posterior left apex measuring 0.6 cm. Non-contrast chest CT at 6-12 months is recommended. If the nodule is stable at time of repeat CT, then future CT at 18-24 months (from today's scan) is considered optional for low-risk patients, but is recommended for high-risk  patients. This recommendation follows the consensus statement: Guidelines for Management of Incidental Pulmonary Nodules Detected on CT Images: From the Fleischner Society 2017; Radiology 2017; 284:228-243. 4. Cirrhosis. 5. 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 6. Sigmoid diverticulosis without evidence of acute diverticulitis. Aortic Atherosclerosis (ICD10-I70.0) and Emphysema (ICD10-J43.9). Electronically Signed   By:  Jearld Lesch M.D.   On: 07/30/2023 14:18   CT HEAD WO CONTRAST Result Date: 07/30/2023 CLINICAL DATA:  Trauma EXAM: CT HEAD WITHOUT CONTRAST CT CERVICAL SPINE WITHOUT CONTRAST TECHNIQUE: Multidetector CT imaging of the head and cervical spine was performed following the standard protocol without intravenous contrast. Multiplanar CT image reconstructions of the cervical spine were also generated. RADIATION DOSE REDUCTION: This exam was performed according to the departmental dose-optimization program which includes automated exposure control, adjustment of the mA and/or kV according to patient size and/or use of iterative reconstruction technique. COMPARISON:  None Available. FINDINGS: CT HEAD FINDINGS Brain: Cortical based hypodensity of the right parieto-occipital junction measuring 2.4 x 2.2 cm (series 11, image 25, series 9, image 24). No evidence of hemorrhage, hydrocephalus, extra-axial collection or mass effect. Vascular: No hyperdense vessel or unexpected calcification. Skull: Normal. Negative for fracture or focal lesion. Sinuses/Orbits: No acute finding. Other: Soft tissue contusion of the left scalp vertex (series 11, image 7) CT CERVICAL SPINE FINDINGS Alignment: Degenerative straightening of the normal cervical lordosis. Skull base and vertebrae: No acute fracture. No primary bone lesion or focal pathologic process. Soft tissues and spinal canal: No prevertebral fluid or swelling. No visible canal hematoma. Disc levels: Moderate disc space height loss and osteophytosis from C5-C7 Upper chest: Negative. Other: None. IMPRESSION: 1. Cortical based hypodensity of the right parieto-occipital junction measuring 2.4 x 2.2 cm. This is concerning for acute or subacute infarct or alternately underlying mass. Recommend MRI for further evaluation. 2. No evidence of hemorrhage or mass effect. 3. Soft tissue contusion of the left scalp vertex. 4. No fracture or static subluxation of the cervical spine. 5.  Moderate disc degenerative disease from C5-C7. Electronically Signed   By: Jearld Lesch M.D.   On: 07/30/2023 14:13   CT CERVICAL SPINE WO CONTRAST Result Date: 07/30/2023 CLINICAL DATA:  Trauma EXAM: CT HEAD WITHOUT CONTRAST CT CERVICAL SPINE WITHOUT CONTRAST TECHNIQUE: Multidetector CT imaging of the head and cervical spine was performed following the standard protocol without intravenous contrast. Multiplanar CT image reconstructions of the cervical spine were also generated. RADIATION DOSE REDUCTION: This exam was performed according to the departmental dose-optimization program which includes automated exposure control, adjustment of the mA and/or kV according to patient size and/or use of iterative reconstruction technique. COMPARISON:  None Available. FINDINGS: CT HEAD FINDINGS Brain: Cortical based hypodensity of the right parieto-occipital junction measuring 2.4 x 2.2 cm (series 11, image 25, series 9, image 24). No evidence of hemorrhage, hydrocephalus, extra-axial collection or mass effect. Vascular: No hyperdense vessel or unexpected calcification. Skull: Normal. Negative for fracture or focal lesion. Sinuses/Orbits: No acute finding. Other: Soft tissue contusion of the left scalp vertex (series 11, image 7) CT CERVICAL SPINE FINDINGS Alignment: Degenerative straightening of the normal cervical lordosis. Skull base and vertebrae: No acute fracture. No primary bone lesion or focal pathologic process. Soft tissues and spinal canal: No prevertebral fluid or swelling. No visible canal hematoma. Disc levels: Moderate disc space height loss and osteophytosis from C5-C7 Upper chest: Negative. Other: None. IMPRESSION: 1. Cortical based hypodensity of the right parieto-occipital junction measuring 2.4 x 2.2 cm. This is concerning for  acute or subacute infarct or alternately underlying mass. Recommend MRI for further evaluation. 2. No evidence of hemorrhage or mass effect. 3. Soft tissue contusion of the left  scalp vertex. 4. No fracture or static subluxation of the cervical spine. 5. Moderate disc degenerative disease from C5-C7. Electronically Signed   By: Jearld Lesch M.D.   On: 07/30/2023 14:13     PHYSICAL EXAM  Temp:  [97.6 F (36.4 C)-98.6 F (37 C)] 98.5 F (36.9 C) (12/26 1134) Pulse Rate:  [85-98] 86 (12/26 1134) Resp:  [16-20] 18 (12/26 1134) BP: (131-136)/(55-96) 134/96 (12/26 1134) SpO2:  [93 %-97 %] 97 % (12/26 1134)  General - Well nourished, well developed, in no apparent distress.  Ophthalmologic - fundi not visualized due to noncooperation.  Cardiovascular - Regular rhythm and rate.  Mental Status -  Level of arousal and orientation to time, place, and person were intact. Language including expression, naming, repetition, comprehension was assessed and found intact. Fund of Knowledge was assessed and was intact.  Cranial Nerves II - XII - II - Visual field intact OU, however, left visual field decreased visual acuity with intermittent simultagnosia. III, IV, VI - Extraocular movements intact. V - Facial sensation intact bilaterally. VII - Facial movement intact bilaterally. VIII - Hearing & vestibular intact bilaterally. X - Palate elevates symmetrically. XI - Chin turning & shoulder shrug intact bilaterally. XII - Tongue protrusion intact.  Motor Strength - The patient's strength was normal in all extremities and pronator drift was absent.  Bulk was normal and fasciculations were absent.   Motor Tone - Muscle tone was assessed at the neck and appendages and was normal.  Reflexes - The patient's reflexes were symmetrical in all extremities and she had no pathological reflexes.  Sensory - Light touch, temperature/pinprick were assessed and were symmetrical.    Coordination - The patient had normal movements in the hands and feet with no ataxia or dysmetria.  Tremor was absent.  Gait and Station - deferred.    ASSESSMENT/PLAN Maria Rose is a 67  y.o. female with history of nasal cellulitis on Abx admitted for SOB, syncope, hyperglycemia, speech difficulty, AMS with elevated lactic acid. No tPA given due to outside window.    Stroke:  bilateral embolic shower, embolic pattern, etiology unclear, needs to rule out endocarditis CT right parietooccipital infarct CTA head and neck mild bilateral ICA stenoses.  MRI  Acute to early subacute infarct in the right posterior temporal and lateral occipital and parietal cortex, with associated edema and petechial hemorrhage. Additional punctate acute infarcts in the right cerebellum, left temporal lobe, left occipital lobe, right occipital lobe, right parietal lobe, right frontal lobe, right thalamus, and left frontal lobe. These foci are both in the white matter and cortex. 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 On octreotide, PPI IV and empiric Abx  IR plan for TIPS on Monday  Severe anemia  S/p PRBC infusion Hb 10.3->5.3->PRBC->9.1->7.2->PRBC->8.3->7.9 GI  on board, EGD showed gastric and duodenum bleeding S/p hemostatic spray Close monitoring  Diabetes, new diagnosis HgbA1c 11.4 goal < 7.0 Currently on insulin drip CBG monitoring SSI DM education and close PCP follow up  Leukocytosis Elevated lactic acid WBC 19.7->15.7->16.7->8.3 Afebrile Elevated LA UA neg Blood culture NGTD Concerning for  sepsis, on Rocephin, flagyl and vancomycin -> Rocephine Recommend TEE to rule out endocarditis  BP management BP Stable Avoid low BP Long term BP goal normotensive  Lipid management Home meds:  none  LDL 63, goal < 70 no statin needed given LDL at goal  Other Stroke Risk Factors Advanced age  Other Active Problems Thrombocytopenia - 155->135->87->pending  Hospital day # 2   Marvel Plan, MD PhD Stroke Neurology 08/02/2023 2:00 PM    To contact Stroke Continuity provider, please refer to [email or web address]. After hours, contact General Neurology

## 2023-08-02 NOTE — Evaluation (Signed)
Occupational Therapy Evaluation Patient Details Name: Maria Rose MRN: [id number] DOB: [birth year] Today's Date: 08/02/2023   History of Present Illness 67 y.o. female presents to [facility] hospital on 07/30/2023 after a fall and shaking episode. 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 Additional punctate acute infarcts in right cerebellum, left temporal lobe, left occipital lobe, right occipital lobe, right parietal lobe, right frontal lobe, right thalamus, left frontal lobe. Pt with bloody bowel movement and hematemesis on 12/24. PMH includes hepatic cirrhosis, gastric and splenic varices, sepsis.   Clinical Impression   Pt is typically independent with her RW, drives, completes ADLs and light meal prep independently while her family is at work. She lives with her supportive husband and son. Pt presents with generalized weakness, impaired dynamic standing balance and L visual field deficit, apparent during functional activities at sink and with navigating in her room while ambulating. Pt unaware of vision changes, typically wears contacts and attributes difficulty reading menu to not having contacts here. Pt requires set up to moderate assistance for ADLs. She needs up to min assist for sit to stand and CGA with RW to ambulate to bathroom and sink. Patient will benefit from intensive inpatient follow up therapy, >3 hours/day.       If plan is discharge home, recommend the following: A little help with walking and/or transfers;A lot of help with bathing/dressing/bathroom;Assistance with cooking/housework;Assist for transportation;Help with stairs or ramp for entrance    Functional Status Assessment  Patient has had a recent decline in their functional status and demonstrates the ability to make significant improvements in function in a reasonable and predictable amount of time.  Equipment  Recommendations  BSC/3in1    Recommendations for Other Services       Precautions / Restrictions Precautions Precautions: Fall Restrictions Weight Bearing Restrictions Per Provider Order: No      Mobility Bed Mobility               General bed mobility comments: in chair    Transfers Overall transfer level: Needs assistance Equipment used: Rolling walker (2 wheels) Transfers: Sit to/from Stand Sit to Stand: Min assist           General transfer comment: assist to rise and steady, cues for hand placement      Balance     Sitting balance-Leahy Scale: Good       Standing balance-Leahy Scale: Poor                             ADL either performed or assessed with clinical judgement   ADL Overall ADL's : Needs assistance/impaired Eating/Feeding: Independent;Sitting   Grooming: Supervision/safety;Standing;Oral care;Wash/dry hands   Upper Body Bathing: Minimal assistance;Sitting   Lower Body Bathing: Sit to/from stand;Moderate assistance   Upper Body Dressing : Set up;Sitting   Lower Body Dressing: Sit to/from stand;Moderate assistance   Toilet Transfer: Contact guard assist;Ambulation;Rolling walker (2 wheels)   Toileting- Clothing Manipulation and Hygiene: Set up;Sitting/lateral lean       Functional mobility during ADLs: Contact guard assist;Rolling walker (2 wheels)       Vision Patient Visual Report: No change from baseline Vision Assessment?: Yes Visual Fields: Left visual field deficit Additional Comments: wears contacts, difficulty locating ADL items in L hemispace and finding chair when returning from bathroom     Perception  Praxis         Pertinent Vitals/Pain Pain Assessment Pain Location: head     Extremity/Trunk Assessment Upper Extremity Assessment Upper Extremity Assessment: Overall WFL for tasks assessed   Lower Extremity Assessment Lower Extremity Assessment: Defer to PT evaluation    Cervical / Trunk Assessment Cervical / Trunk Assessment: Kyphotic   Communication Communication Communication: No apparent difficulties   Cognition Arousal: Alert (Simultaneous filing. User may not have seen previous data.) Behavior During Therapy: [facility] for tasks assessed/performed (Simultaneous filing. User may not have seen previous data.) Overall Cognitive Status: History of cognitive impairments - at baseline (Simultaneous filing. User may not have seen previous data.) Area of Impairment: Following commands (Simultaneous filing. User may not have seen previous data.)                       Following Commands:  (following commands requires repetition at times)             General Comments  VSS on RA    Exercises     Shoulder Instructions      Home Living Family/patient expects to be discharged to:: Private residence Living Arrangements: Spouse/significant other;Children Available Help at Discharge: Family;Available 24 hours/day Type of Home: House Home Access: Stairs to enter [employer] of Steps: 10 Entrance Stairs-Rails: None Home Layout: One level     Bathroom Shower/Tub: Chief Strategy Officer: Standard     Home Equipment: Agricultural consultant (2 wheels)      Lives With: Spouse;Son    Prior Functioning/Environment Prior Level of Function : Independent/Modified Independent;Driving             Mobility Comments: ambulatory with RW          OT Problem List: Decreased activity tolerance;Decreased strength;Impaired balance (sitting and/or standing);Decreased cognition      OT Treatment/Interventions: Self-care/ADL training;DME and/or AE instruction;Therapeutic activities;Patient/family education;Balance training;Cognitive remediation/compensation    OT Goals(Current goals can be found in the care plan section) Acute Rehab OT Goals OT Goal Formulation: With patient Time For Goal Achievement: 08/16/23 Potential to Achieve  Goals: Good  OT Frequency: Min 1X/week    Co-evaluation              AM-PAC OT "6 Clicks" Daily Activity     Outcome Measure Help from another person eating meals?: None Help from another person taking care of personal grooming?: A Little Help from another person toileting, which includes using toliet, bedpan, or urinal?: A Little Help from another person bathing (including washing, rinsing, drying)?: A Lot Help from another person to put on and taking off regular upper body clothing?: A Little Help from another person to put on and taking off regular lower body clothing?: A Lot 6 Click Score: 17   End of Session    Activity Tolerance: Patient tolerated treatment well Patient left: in chair;with call bell/phone within reach;with chair alarm set  OT Visit Diagnosis: Unsteadiness on feet (R26.81);Other abnormalities of gait and mobility (R26.89);Muscle weakness (generalized) (M62.81);Other symptoms and signs involving cognitive function                Time: 1610-9604 OT Time Calculation (min): 34 min Charges:  OT General Charges $OT Visit: 1 Visit OT Evaluation $OT Eval Moderate Complexity: 1 Mod OT Treatments $Self Care/Home Management : 8-22 mins  Berna Spare, OTR/L Acute Rehabilitation Services Office: [phone]   Evern Bio 08/02/2023, 10:22 AM

## 2023-08-02 NOTE — Progress Notes (Signed)
Physical Therapy Treatment Patient Details Name: Maria Rose MRN: [id number] DOB: [birth year] Today's Date: 08/02/2023   History of Present Illness 67 y.o. female presents to [facility] hospital on 07/30/2023 after a fall and shaking episode. 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 Additional punctate acute infarcts in right cerebellum, left temporal lobe, left occipital lobe, right occipital lobe, right parietal lobe, right frontal lobe, right thalamus, left frontal lobe. Pt with bloody bowel movement and hematemesis on 12/24. PMH includes hepatic cirrhosis, gastric and splenic varices, sepsis.    PT Comments  Pt received in bed, motivated to participate in therapy. She required min assist bed mobility, min assist transfers, and min assist amb 30' with RW. Good sitting balance and poor standing balance. Lean/veer L during amb requiring min assist to correct. Pt labile during session, tearful at times. Pt in recliner with feet elevated at end of session. Current POC remains appropriate.     If plan is discharge home, recommend the following: A lot of help with bathing/dressing/bathroom;Assistance with cooking/housework;Direct supervision/assist for medications management;Direct supervision/assist for financial management;Assist for transportation;Help with stairs or ramp for entrance;A little help with walking and/or transfers   Can travel by private vehicle        Equipment Recommendations  Rolling walker (2 wheels)    Recommendations for Other Services       Precautions / Restrictions Precautions Precautions: Fall Restrictions Weight Bearing Restrictions Per Provider Order: No     Mobility  Bed Mobility Overal bed mobility: Needs Assistance Bed Mobility: Supine to Sit     Supine to sit: Min assist, HOB elevated, Used rails     General bed mobility comments: cues for  sequencing, assist to elevate trunk    Transfers Overall transfer level: Needs assistance Equipment used: Rolling walker (2 wheels) Transfers: Sit to/from Stand Sit to Stand: Min assist           General transfer comment: cues for hand placement and sequencing, assist to power up    Ambulation/Gait Ambulation/Gait assistance: Min assist Gait Distance (Feet): 30 Feet Assistive device: Rolling walker (2 wheels) Gait Pattern/deviations: Step-through pattern, Decreased stride length, Drifts right/left Gait velocity: decreased Gait velocity interpretation: <1.31 ft/sec, indicative of household ambulator   General Gait Details: cues for posture and poroximity to RW. Lean/veer L   Stairs             Wheelchair Mobility     Tilt Bed    Modified Rankin (Stroke Patients Only)       Balance Overall balance assessment: Needs assistance Sitting-balance support: No upper extremity supported, Feet supported Sitting balance-Leahy Scale: Good     Standing balance support: Bilateral upper extremity supported, Reliant on assistive device for balance, During functional activity Standing balance-Leahy Scale: Poor                              Cognition                                 Safety/Judgement: Decreased awareness of safety, Decreased awareness of deficits Awareness: Emergent Problem Solving: Slow processing, Difficulty sequencing, Requires verbal cues          Exercises      General Comments General comments (skin integrity, edema, etc.): VSS on RA  Pertinent Vitals/Pain Pain Assessment Pain Assessment: Faces Faces Pain Scale: Hurts a little bit Pain Location: headache Pain Descriptors / Indicators: Headache Pain Intervention(s): Monitored during session, Repositioned    Home Living Family/patient expects to be discharged to:: Private residence Living Arrangements: Spouse/significant other;Children Available Help at  Discharge: Family;Available 24 hours/day Type of Home: House Home Access: Stairs to enter Entrance Stairs-Rails: None Entrance Stairs-Number of Steps: 10   Home Layout: One level Home Equipment: Agricultural consultant (2 wheels)      Prior Function            PT Goals (current goals can now be found in the care plan section) Acute Rehab PT Goals Patient Stated Goal: to return to independence Progress towards PT goals: Progressing toward goals    Frequency    Min 1X/week      PT Plan      Co-evaluation              AM-PAC PT "6 Clicks" Mobility   Outcome Measure  Help needed turning from your back to your side while in a flat bed without using bedrails?: A Little Help needed moving from lying on your back to sitting on the side of a flat bed without using bedrails?: A Little Help needed moving to and from a bed to a chair (including a wheelchair)?: A Little Help needed standing up from a chair using your arms (e.g., wheelchair or bedside chair)?: A Little Help needed to walk in hospital room?: A Little Help needed climbing 3-5 steps with a railing? : Total 6 Click Score: 16    End of Session Equipment Utilized During Treatment: Gait belt Activity Tolerance: Patient tolerated treatment well Patient left: in chair;with chair alarm set;with call bell/phone within reach Nurse Communication: Mobility status PT Visit Diagnosis: Other abnormalities of gait and mobility (R26.89);Muscle weakness (generalized) (M62.81)     Time: 8469-6295 PT Time Calculation (min) (ACUTE ONLY): 24 min  Charges:    $Gait Training: 23-37 mins PT General Charges $$ ACUTE PT VISIT: 1 Visit                     Ferd Glassing., PT  Office # [phone]    Ilda Foil 08/02/2023, 10:20 AM

## 2023-08-02 NOTE — Progress Notes (Addendum)
   [facility] has been requested to perform a transesophageal echocardiogram on Maria Rose for bacteremia and stroke.  After careful review of history and examination, the risks and benefits of transesophageal echocardiogram have been explained including risks of esophageal damage, perforation (1:10,000 risk), bleeding, pharyngeal hematoma as well as other potential complications associated with conscious sedation including aspiration, arrhythmia, respiratory failure and death. Alternatives to treatment were discussed, questions were answered. Patient is willing to proceed.    Dr. Roda Shutters requesting TEE for patient with embolic stroke with suspicious for sepsis. Patient had EGD on 12/24 that showed grade I esophageal varices in lower third of esophagus. Will defer to TEE performer Dr. Tenny Craw and final decision, but patient agreeable to risks and benefits.    Addendum: In review of patient's chart, Dr. Tenny Craw will be canceling today's procedure for TEE. She feels that the timing of this can wait and will be discussing with primary team as well as GI about her concerns.    Abagail Kitchens, PA-C  08/02/2023 7:01 AM

## 2023-08-03 ENCOUNTER — Other Ambulatory Visit (HOSPITAL_COMMUNITY): Payer: Self-pay

## 2023-08-03 DIAGNOSIS — I8501 Esophageal varices with bleeding: Secondary | ICD-10-CM

## 2023-08-03 DIAGNOSIS — R6510 Systemic inflammatory response syndrome (SIRS) of non-infectious origin without acute organ dysfunction: Secondary | ICD-10-CM | POA: Diagnosis not present

## 2023-08-03 DIAGNOSIS — R55 Syncope and collapse: Secondary | ICD-10-CM | POA: Diagnosis not present

## 2023-08-03 LAB — CBC
HCT: 23.1 % — ABNORMAL LOW (ref 36.0–46.0)
Hemoglobin: 7.4 g/dL — ABNORMAL LOW (ref 12.0–15.0)
MCH: 25 pg — ABNORMAL LOW (ref 26.0–34.0)
MCHC: 32 g/dL (ref 30.0–36.0)
MCV: 78 fL — ABNORMAL LOW (ref 80.0–100.0)
Platelets: 84 10*3/uL — ABNORMAL LOW (ref 150–400)
RBC: 2.96 MIL/uL — ABNORMAL LOW (ref 3.87–5.11)
RDW: 27.5 % — ABNORMAL HIGH (ref 11.5–15.5)
WBC: 6.7 10*3/uL (ref 4.0–10.5)
nRBC: 0.3 % — ABNORMAL HIGH (ref 0.0–0.2)

## 2023-08-03 LAB — GLUCOSE, CAPILLARY
Glucose-Capillary: 125 mg/dL — ABNORMAL HIGH (ref 70–99)
Glucose-Capillary: 131 mg/dL — ABNORMAL HIGH (ref 70–99)
Glucose-Capillary: 133 mg/dL — ABNORMAL HIGH (ref 70–99)
Glucose-Capillary: 138 mg/dL — ABNORMAL HIGH (ref 70–99)
Glucose-Capillary: 152 mg/dL — ABNORMAL HIGH (ref 70–99)
Glucose-Capillary: 154 mg/dL — ABNORMAL HIGH (ref 70–99)
Glucose-Capillary: 190 mg/dL — ABNORMAL HIGH (ref 70–99)

## 2023-08-03 LAB — ANA W/REFLEX IF POSITIVE: Anti Nuclear Antibody (ANA): NEGATIVE

## 2023-08-03 LAB — MITOCHONDRIAL ANTIBODIES: Mitochondrial M2 Ab, IgG: <20 U (ref 0.0–20.0)

## 2023-08-03 LAB — ANTI-SMOOTH MUSCLE ANTIBODY, IGG: F-Actin IgG: 2 U (ref 0–19)

## 2023-08-03 MED ORDER — LIVING WELL WITH DIABETES BOOK
Freq: Once | Status: AC
  Filled 2023-08-03: qty 1

## 2023-08-03 MED ORDER — INSULIN STARTER KIT- PEN NEEDLES (ENGLISH)
1.0000 | Freq: Once | Status: AC
  Administered 2023-08-03: 1
  Filled 2023-08-03: qty 1

## 2023-08-03 NOTE — Inpatient Diabetes Management (Signed)
Inpatient Diabetes Program Recommendations  AACE/ADA: New Consensus Statement on Inpatient Glycemic Control (2015)  Target Ranges:  Prepandial:   less than 140 mg/dL      Peak postprandial:   less than 180 mg/dL (1-2 hours)      Critically ill patients:  140 - 180 mg/dL   Lab Results  Component Value Date   GLUCAP 138 (H) 08/03/2023   HGBA1C 11.4 (H) 07/30/2023    Review of Glycemic Control  Latest Reference Range & Units 08/02/23 07:38 08/02/23 11:33 08/02/23 16:29 08/02/23 20:29 08/03/23 00:00 08/03/23 04:14 08/03/23 08:38  Glucose-Capillary 70 - 99 mg/dL 732 (H) 202 (H) 542 (H) 177 (H) 133 (H) 125 (H) 138 (H)  (H): Data is abnormally high  Diabetes history: New DM2 Outpatient Diabetes medications: None Current orders for Inpatient glycemic control: Semglee 12 units every day, Novolog 2-6 units TID  Inpatient Diabetes Program Recommendations:    Please consider:  Novolog 0-9 units TID  Spoke with patient at bedside.  Spoke with pt about new diagnosis. Discussed A1C results with her and explained what an A1C is, basic pathophysiology of DM Type 2, basic home care, basic diabetes diet nutrition principles, importance of checking CBGs and maintaining good CBG control to prevent long-term and short-term complications. Reviewed signs and symptoms of hyperglycemia and hypoglycemia and how to treat hypoglycemia at home. Also reviewed blood sugar goals at home.  RNs to provide ongoing basic DM education at bedside with this patient. Have ordered educational booklet & insulin starter kit.    Introduced the insulin pen and insulins she will need at home.  Lantus and Novolog are preferred with her insurance.  Educated on The Plate Method, CHO's, portion control, CBGs at home fasting and mid afternoon, F/U with PCP every 3 months, bring meter to PCP office, long and short term complications of uncontrolled BG, and importance of exercise.  Reviewed hypoglycemia, < 70 mg/dL, signs, symptoms and  treatments.   Will follow up with patient next week.    Will continue to follow while inpatient.  Thank you, Dulce Sellar, MSN, CDCES Diabetes Coordinator Inpatient Diabetes Program [phone] (team pager from 8a-5p)

## 2023-08-03 NOTE — Progress Notes (Signed)
IP rehab admissions - I met with patient and her husband yesterday at the bedside.  Noted plan for TIPS procedure on Monday.  Once all procedures are completed, we can then submit information to insurance to request inpatient rehab admission as needed.  Will have my partner follow up next week.  [phone]

## 2023-08-03 NOTE — Progress Notes (Signed)
Physical Therapy Treatment Patient Details Name: Maria Rose MRN: [id number] DOB: [birth year] Today's Date: 08/03/2023   History of Present Illness 67 y.o. female presents to [facility] hospital on 07/30/2023 after a fall and shaking episode. 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 Additional punctate acute infarcts in right cerebellum, left temporal lobe, left occipital lobe, right occipital lobe, right parietal lobe, right frontal lobe, right thalamus, left frontal lobe. Pt with bloody bowel movement and hematemesis on 12/24. PMH includes hepatic cirrhosis, gastric and splenic varices, sepsis.    PT Comments  Pt with steady mobility progression. Min assist bed mobility, min assist transfers, and min assist amb 60' with RW. L visual field deficits. L lean/veer during amb.  Gait distance limited by fatigue. Pt in recliner with feet elevated at end of session.    If plan is discharge home, recommend the following: A lot of help with bathing/dressing/bathroom;Assistance with cooking/housework;Direct supervision/assist for medications management;Direct supervision/assist for financial management;Assist for transportation;Help with stairs or ramp for entrance;A little help with walking and/or transfers   Can travel by private vehicle        Equipment Recommendations  Rolling walker (2 wheels)    Recommendations for Other Services       Precautions / Restrictions Precautions Precautions: Fall;Other (comment) Precaution Comments: L visual field deficits     Mobility  Bed Mobility Overal bed mobility: Needs Assistance Bed Mobility: Supine to Sit     Supine to sit: Min assist, HOB elevated, Used rails     General bed mobility comments: cues for sequencing, assist to elevate trunk    Transfers Overall transfer level: Needs assistance Equipment used: Rolling walker (2  wheels) Transfers: Sit to/from Stand Sit to Stand: Min assist           General transfer comment: cues for hand placement and sequencing, assist to power up    Ambulation/Gait Ambulation/Gait assistance: Min assist Gait Distance (Feet): 60 Feet Assistive device: Rolling walker (2 wheels) Gait Pattern/deviations: Step-through pattern, Decreased stride length, Drifts right/left Gait velocity: decreased Gait velocity interpretation: <1.31 ft/sec, indicative of household ambulator   General Gait Details: cues for posture and poroximity to RW. Lean/veer L   Stairs             Wheelchair Mobility     Tilt Bed    Modified Rankin (Stroke Patients Only)       Balance Overall balance assessment: Needs assistance Sitting-balance support: No upper extremity supported, Feet supported Sitting balance-Leahy Scale: Good     Standing balance support: Bilateral upper extremity supported, Reliant on assistive device for balance, During functional activity Standing balance-Leahy Scale: Poor                              Cognition Arousal: Alert Behavior During Therapy: Lability Overall Cognitive Status: History of cognitive impairments - at baseline                                          Exercises      General Comments General comments (skin integrity, edema, etc.): VSS on RA      Pertinent Vitals/Pain Pain Assessment Pain Assessment: Faces Faces Pain Scale: Hurts little more Pain Location: bilat knees and feet, posterior headache Pain Descriptors /  Indicators: Headache, Discomfort, Sore Pain Intervention(s): Monitored during session, Limited activity within patient's tolerance, Repositioned    Home Living                          Prior Function            PT Goals (current goals can now be found in the care plan section) Acute Rehab PT Goals Patient Stated Goal: to return to independence Progress towards PT goals:  Progressing toward goals    Frequency    Min 1X/week      PT Plan      Co-evaluation              AM-PAC PT "6 Clicks" Mobility   Outcome Measure  Help needed turning from your back to your side while in a flat bed without using bedrails?: A Little Help needed moving from lying on your back to sitting on the side of a flat bed without using bedrails?: A Little Help needed moving to and from a bed to a chair (including a wheelchair)?: A Little Help needed standing up from a chair using your arms (e.g., wheelchair or bedside chair)?: A Little Help needed to walk in hospital room?: A Little Help needed climbing 3-5 steps with a railing? : Total 6 Click Score: 16    End of Session Equipment Utilized During Treatment: Gait belt Activity Tolerance: Patient tolerated treatment well Patient left: in chair;with call bell/phone within reach;with chair alarm set Nurse Communication: Mobility status PT Visit Diagnosis: Other abnormalities of gait and mobility (R26.89);Muscle weakness (generalized) (M62.81)     Time: 1610-9604 PT Time Calculation (min) (ACUTE ONLY): 23 min  Charges:    $Gait Training: 23-37 mins PT General Charges $$ ACUTE PT VISIT: 1 Visit                     Ferd Glassing., PT  Office # [phone]    Ilda Foil 08/03/2023, 11:55 AM

## 2023-08-03 NOTE — Progress Notes (Signed)
HD#3 SUBJECTIVE:  Patient Summary: Maria Rose is a 67 YO F with no past medical history presenting to the ED with a three day history of headache and syncopal episode and admitted for stroke workup.  Overnight Events: NAEO   Interim History: Patient was evaluated bedside.  Denies any chest pain, shortness of breath, troubles breathing, abdominal pain, melena, or hematochezia.  Still is not passing stool since last seen yesterday.  She is feeling very fatigued but no other signs or symptoms.  OBJECTIVE:  Vital Signs: Vitals:   08/02/23 2026 08/02/23 2342 08/03/23 0343 08/03/23 0837  BP: 133/77 129/61 (!) 124/50 (!) 136/59  Pulse: 83 81 76 82  Resp: 14 15 15 14   Temp: 98.5 F (36.9 C) 98.8 F (37.1 C) 98.3 F (36.8 C) 98.4 F (36.9 C)  TempSrc: Oral Oral Oral Oral  SpO2: 94% 93% 95%   Weight:      Height:       Supplemental O2: Room Air SpO2: 95 % O2 Flow Rate (L/min): 1 L/min  Filed Weights   07/30/23 1132 08/01/23 0430  Weight: 81.6 kg 86.7 kg    No intake or output data in the 24 hours ending 08/03/23 0937  Net IO Since Admission: 5,135.38 mL [08/03/23 0937]  CBC    Component Value Date/Time   WBC 6.7 08/03/2023 0337   RBC 2.96 (L) 08/03/2023 0337   HGB 7.4 (L) 08/03/2023 0337   HCT 23.1 (L) 08/03/2023 0337   PLT 84 (L) 08/03/2023 0337   MCV 78.0 (L) 08/03/2023 0337   MCH 25.0 (L) 08/03/2023 0337   MCHC 32.0 08/03/2023 0337   RDW 27.5 (H) 08/03/2023 0337   LYMPHSABS 3.9 07/31/2023 0222   MONOABS 0.9 07/31/2023 0222   EOSABS 0.0 07/31/2023 0222   BASOSABS 0.0 07/31/2023 0222   Physical Exam: Physical Exam Constitutional:      Appearance: Normal appearance.  HENT:     Head: Normocephalic and atraumatic.  Cardiovascular:     Rate and Rhythm: Normal rate and regular rhythm.     Pulses: Normal pulses.     Heart sounds: Normal heart sounds.  Pulmonary:     Effort: Pulmonary effort is normal.     Breath sounds: Normal breath sounds.  Abdominal:      General: Bowel sounds are normal.  Neurological:     General: No focal deficit present.     Mental Status: She is alert and oriented to person, place, and time.    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  Will likely perform TEE after stabilization improvement in patient's status.  Continues to be followed by cardiology and neurology. 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  Remains on octreotide, IV PPI, and ceftriaxone.  Hemoglobin at 7.4 this morning from 7.9 yesterday.  Scheduled for elective TIPS by  IR on Monday.  Remains on antibiotics and will finish her octreotide today. - Continue ceftriaxone (last dose on 12/29) - Continue octreotide (last dose on 12/27) - Continue PPI  - F/U CBC  #Leukocytosis, resolved WBC 6.7 this morning. Denies any fever, chills, or night sweats.  - F/U CBC   #T2DM Remains well controlled. Continue SSI and 12 units Semglee.  - Trend CBG  Best Practice: Diet: Cardiac diet VTE: SCDs Start: 07/30/23 1620 Code: Full AB: CTX Therapy Recs: CIR DISPO: Anticipated discharge  TBD  to  CIR  pending  stroke workup and TIPS .  Signature: Morrie Sheldon, MD Internal Medicine Resident, PGY-1 [facility]  Pager: [phone]  Please contact the  on call pager after 5 pm and on weekends at [phone].

## 2023-08-03 NOTE — Progress Notes (Signed)
Referring Physician(s): Dr. Tomasa Rand  Supervising Physician: Gilmer Mor  Patient Status:  [facility] - In-pt  Chief Complaint: GI bleed  Subjective: Resting comfortably. Family at bedside.  No melena or BRB, complains of constipation.  Hgb down to 7.5 this AM.   Allergies: Patient has no known allergies.  Medications: Prior to Admission medications   Medication Sig Start Date End Date Taking? Authorizing Provider  clotrimazole (LOTRIMIN) 1 % cream Apply to affected area 2 times daily 05/16/23  Yes Renne Crigler, PA-C  erythromycin ophthalmic ointment Place 1 Application into the left eye 4 (four) times daily. 06/03/23  Yes [provider]  mupirocin ointment (BACTROBAN) 2 % Apply 1 Application topically 2 (two) times daily. 06/26/23  Yes [provider]     Vital Signs: BP (!) 153/64 (BP Location: Left Arm)   Pulse 82   Temp 98.1 F (36.7 C) (Oral)   Resp 17   Ht 5\' 6"  (1.676 m)   Wt 191 lb 2.2 oz (86.7 kg)   SpO2 95%   BMI 30.85 kg/m   Physical Exam Vitals and nursing note reviewed.  Constitutional:      General: She is not in acute distress.    Appearance: Normal appearance. She is not ill-appearing.  HENT:     Mouth/Throat:     Mouth: Mucous membranes are moist.     Pharynx: Oropharynx is clear.  Cardiovascular:     Rate and Rhythm: Normal rate and regular rhythm.     Pulses: Normal pulses.  Pulmonary:     Effort: Pulmonary effort is normal.     Breath sounds: Normal breath sounds.  Abdominal:     General: Abdomen is flat. There is distension.     Tenderness: There is no abdominal tenderness.  Skin:    General: Skin is warm and dry.  Neurological:     General: No focal deficit present.     Mental Status: She is alert and oriented to person, place, and time. Mental status is at baseline.  Psychiatric:        Mood and Affect: Mood normal.        Behavior: Behavior normal.        Thought Content: Thought content normal.         Judgment: Judgment normal.     Imaging: CT ANGIO ABD/PELVIS BRTO Result Date: 08/01/2023 CLINICAL DATA:  Gastric varices, with liver cirrhosis on the recent CT. EXAM: CTA ABDOMEN AND PELVIS WITHOUT AND WITH CONTRAST TECHNIQUE: Multidetector CT imaging of the abdomen and pelvis was performed using the standard protocol during bolus administration of intravenous contrast. Multiplanar reconstructed images and MIPs were obtained and reviewed to evaluate the vascular anatomy. RADIATION DOSE REDUCTION: This exam was performed according to the departmental dose-optimization program which includes automated exposure control, adjustment of the mA and/or kV according to patient size and/or use of iterative reconstruction technique. CONTRAST:  75mL OMNIPAQUE IOHEXOL 350 MG/ML SOLN COMPARISON:  CT chest, abdomen and pelvis with IV contrast, done for blunt polytrauma, yesterday at 1:29 p.m. FINDINGS: VASCULAR Aorta: There are moderate to heavy aortic mixed plaques without aneurysm, stenosis or dissection, or penetrating aortic ulcer. Celiac: Patent without evidence of aneurysm, dissection, vasculitis or significant stenosis. SMA: 40% non flow-limiting soft plaque vessel origin stenosis. The vessel otherwise opacifies well. No branch occlusion. Renals: There are calcifications at both renal artery origins but no flow-limiting stenosis, aneurysm or dissection. Both renal arteries are single. IMA: There is a 75% mixed plaque vessel  origin stenosis. The vessel otherwise opacify as well. Inflow: There are moderate calcific plaques especially of the common iliac and internal iliac arteries, but no flow-limiting stenosis. Proximal Outflow: There are calcific/mixed plaques in both common femoral arteries, right proximal superficial femoral artery, but no flow-limiting stenosis of the visualized outflow arteries. Veins: Patent. Mild prominence of the hepatic portal main vein measuring 14.5 mm. No deep pelvic vein or IVC filling  defects. In the venous phase, venous varices are again shown along the posterior proximal gastric wall, with splenorenal varices posterior to the. There is no recanalization of the umbilical vein. There are no distal esophageal varices. Review of the MIP images confirms the above findings. NON-VASCULAR Lower chest: There is subpleural reticulation and scattered subpleural bronchiolectasis in the lung bases with mild peripheral faint ground-glass disease, findings consistent with chronic interstitial lung disease. The cardiac size is normal. The inferior mitral ring is heavily calcified. No pericardial effusion. The cardiac blood pool is less dense than the myocardium suggesting anemia. Hepatobiliary: 20 cm in length, mildly steatotic, with nodular capsular contour consistent with cirrhosis. No enhancing liver masses seen. There is excreted contrast filling the gallbladder. There are no stones or wall thickening but there is interval new pericholecystic edema and trace pericholecystic fluid. In this case it is probably congestive or related to fluid overload given the hepatic cirrhosis, but please correlate clinically for acalculous cholecystitis. There is no bile duct dilatation. Pancreas: No abnormality. Spleen: Mildly prominent spleen 13.8 cm craniocaudal. No focal lesion. Mildly engorged splenic vein. Adrenals/Urinary Tract: No abnormality. Stomach/Bowel: Contracted stomach, unremarkable apart from the gastric varices. Normal caliber small intestine and appendix. Mild wall thickening has developed in the ascending, transverse and proximal descending colon. There is sigmoid diverticulosis but no evidence of diverticulitis. The wall thickening could be congestive, related to hepatic dysfunction, or due to colitis. Stranding changes have also developed over portions of the thickened colon. No vascular contrast extravasation is seen into the GI tract. Lymphatic: No adenopathy. Reproductive: Uterus and bilateral  adnexa are unremarkable. Other: No abdominal wall hernia or abnormality. No abdominopelvic ascites. Musculoskeletal: Levoscoliosis and advanced degenerative change lumbar spine. Most advanced at L1-2 through L3-4 with acquired spinal stenosis L3-4 and L4-5. There is osteopenia and additional degenerative change in the thoracic spine. IMPRESSION: 1. Aortic and branch vessel atherosclerosis without aneurysm, dissection or flow-limiting aortic stenosis. 2. 40% non flow-limiting soft plaque stenosis of the SMA. 3. 75% mixed plaque stenosis of the IMA. 4. Chronic interstitial lung disease in the lung bases. 5. Likely anemia. 6. Hepatic cirrhosis, with mildly prominent hepatic portal main vein and splenic vein, and gastric and splenorenal varices. No recanalization of the umbilical vein or distal esophageal varices. 7. Mildly prominent spleen. 8. Interval development of pericholecystic edema and trace pericholecystic fluid. In this case it is probably congestive or related to fluid overload given the hepatic cirrhosis, but please correlate clinically for acalculous cholecystitis. 9. Interval development of mild wall thickening in the ascending, transverse and proximal descending colon, with stranding changes over portions of the thickened colon. This could be congestive, related to hepatic dysfunction, or due to colitis. 10. Diverticulosis without evidence of diverticulitis. 11. Osteopenia, lumbar scoliosis, and degenerative change. Aortic Atherosclerosis (ICD10-I70.0). Electronically Signed   By: Almira Bar M.D.   On: 08/01/2023 07:24   DG Chest Port 1 View Result Date: 08/01/2023 CLINICAL DATA:  [id number] with respiratory failure. EXAM: PORTABLE CHEST 1 VIEW COMPARISON:  Chest CT 07/30/2023 FINDINGS: The cardiomediastinal silhouette is normal.  There is aortic atherosclerosis. There is subpleural reticulation with a basal gradient. No active infiltrate is seen. No substantial pleural effusion. Osteopenia with no  acute skeletal findings. No consolidation or other change in overall aeration is seen. IMPRESSION: 1. No evidence of acute chest disease. 2. Aortic atherosclerosis. 3. Subpleural reticulation with a basal gradient. Electronically Signed   By: Almira Bar M.D.   On: 08/01/2023 06:57   ECHOCARDIOGRAM COMPLETE BUBBLE STUDY Result Date: 07/31/2023    ECHOCARDIOGRAM REPORT   Patient Name:   [name] Date of Exam: 07/31/2023 Medical Rec #:  [id number]       Height:       66.0 in Accession #:    [id number]      Weight:       180.0 lb Date of Birth:  [birth year]        BSA:          1.912 m Patient Age:    67 years        BP:           124/51 mmHg Patient Gender: F               HR:           105 bpm. Exam Location:  Inpatient Procedure: 2D Echo, Cardiac Doppler, Color Doppler and Saline Contrast Bubble            Study Indications:    Stroke  History:        Patient has no prior history of Echocardiogram examinations.                 Risk Factors:Current Smoker.  Sonographer:    Karma Ganja Referring Phys: [id number] JEFFREY C HATCHER  Sonographer Comments: Technically challenging study due to limited acoustic windows. Image acquisition challenging due to patient body habitus and Image acquisition challenging due to uncooperative patient. IMPRESSIONS  1. Left ventricular ejection fraction, by estimation, is 60 to 65%. The left ventricle has normal function. The left ventricle has no regional wall motion abnormalities. Left ventricular diastolic parameters are consistent with Grade I diastolic dysfunction (impaired relaxation).  2. Right ventricular systolic function is normal. The right ventricular size is normal.  3. Suboptimal bubble study- no shunting seen.  4. The mitral valve is degenerative. No evidence of mitral valve regurgitation.  5. The aortic valve was not well visualized. Aortic valve regurgitation is not visualized. No aortic stenosis is present. Comparison(s): No prior Echocardiogram. FINDINGS  Left  Ventricle: Left ventricular ejection fraction, by estimation, is 60 to 65%. The left ventricle has normal function. The left ventricle has no regional wall motion abnormalities. The left ventricular internal cavity size was normal in size. There is  no left ventricular hypertrophy. Left ventricular diastolic parameters are consistent with Grade I diastolic dysfunction (impaired relaxation). Right Ventricle: The right ventricular size is normal. No increase in right ventricular wall thickness. Right ventricular systolic function is normal. Left Atrium: Left atrial size was normal in size. Right Atrium: Right atrial size was normal in size. Pericardium: There is no evidence of pericardial effusion. Presence of epicardial fat layer. Mitral Valve: The mitral valve is degenerative in appearance. Mild mitral annular calcification. No evidence of mitral valve regurgitation. MV peak gradient, 4.0 mmHg. The mean mitral valve gradient is 2.0 mmHg. Tricuspid Valve: The tricuspid valve is grossly normal. Tricuspid valve regurgitation is not demonstrated. Aortic Valve: The aortic valve was not well visualized. Aortic valve regurgitation is not visualized. No  aortic stenosis is present. Aortic valve mean gradient measures 5.0 mmHg. Aortic valve peak gradient measures 9.9 mmHg. Aortic valve area, by VTI measures 2.26 cm. Pulmonic Valve: The pulmonic valve was not well visualized. Pulmonic valve regurgitation is not visualized. No evidence of pulmonic stenosis. Aorta: The aortic root is normal in size and structure. IAS/Shunts: The interatrial septum was not well visualized. Agitated saline contrast was given intravenously to evaluate for intracardiac shunting.  LEFT VENTRICLE PLAX 2D LVIDd:         5.00 cm   Diastology LVIDs:         3.30 cm   LV e' medial:    7.77 cm/s LV PW:         1.10 cm   LV E/e' medial:  10.4 LV IVS:        1.10 cm   LV e' lateral:   6.53 cm/s LVOT diam:     2.00 cm   LV E/e' lateral: 12.3 LV SV:          53 LV SV Index:   28 LVOT Area:     3.14 cm  RIGHT VENTRICLE RV S prime:     15.90 cm/s TAPSE (M-mode): 2.7 cm LEFT ATRIUM             Index LA diam:        4.30 cm 2.25 cm/m LA Vol (A2C):   37.1 ml 19.40 ml/m LA Vol (A4C):   53.2 ml 27.82 ml/m LA Biplane Vol: 44.0 ml 23.01 ml/m  AORTIC VALVE AV Area (Vmax):    2.28 cm AV Area (Vmean):   2.13 cm AV Area (VTI):     2.26 cm AV Vmax:           157.00 cm/s AV Vmean:          103.000 cm/s AV VTI:            0.234 m AV Peak Grad:      9.9 mmHg AV Mean Grad:      5.0 mmHg LVOT Vmax:         114.00 cm/s LVOT Vmean:        69.800 cm/s LVOT VTI:          0.168 m LVOT/AV VTI ratio: 0.72  AORTA Ao Root diam: 3.40 cm MITRAL VALVE MV Area (PHT): 4.21 cm     SHUNTS MV Area VTI:   2.96 cm     Systemic VTI:  0.17 m MV Peak grad:  4.0 mmHg     Systemic Diam: 2.00 cm MV Mean grad:  2.0 mmHg MV Vmax:       1.00 m/s MV Vmean:      70.0 cm/s MV Decel Time: 180 msec MV E velocity: 80.60 cm/s MV A velocity: 113.00 cm/s MV E/A ratio:  0.71 Riley Lam MD Electronically signed by Riley Lam MD Signature Date/Time: 07/31/2023/2:39:51 PM    Final    CT ANGIO HEAD NECK W WO CM Result Date: 07/31/2023 CLINICAL DATA:  Stroke/TIA, determine embolic source. EXAM: CT ANGIOGRAPHY HEAD AND NECK WITH AND WITHOUT CONTRAST TECHNIQUE: Multidetector CT imaging of the head and neck was performed using the standard protocol during bolus administration of intravenous contrast. Multiplanar CT image reconstructions and MIPs were obtained to evaluate the vascular anatomy. Carotid stenosis measurements (when applicable) are obtained utilizing NASCET criteria, using the distal internal carotid diameter as the denominator. RADIATION DOSE REDUCTION: This exam was performed according to the departmental dose-optimization program which  includes automated exposure control, adjustment of the mA and/or kV according to patient size and/or use of iterative reconstruction technique.  CONTRAST:  75mL OMNIPAQUE IOHEXOL 350 MG/ML SOLN COMPARISON:  Head CT and MRI 07/30/2023 FINDINGS: CT HEAD FINDINGS Brain: Progressively well-defined cytotoxic edema is present at the junction of the right temporal, occipital, and parietal lobes corresponding to the known acute/early subacute infarct and extending over an area of 4.5-5 cm in diameter, possibly with trace associated petechial hemorrhage. The additional scattered punctate acute cerebral and cerebellar infarcts on MRI are occult by CT. No space-occupying parenchymal hematoma, mass, midline shift, or extra-axial fluid collection is identified. There is a background of mild chronic small vessel ischemia in the cerebral white matter. The ventricles are normal in size. Vascular: Calcified atherosclerosis at the skull base. Skull: No acute fracture or suspicious osseous lesion. Sinuses/Orbits: Paranasal sinuses and mastoid air cells are clear. Unremarkable orbits. Other: None. Review of the MIP images confirms the above findings CTA NECK FINDINGS Aortic arch: Standard branching with a moderate amount of mixed soft and calcified atherosclerotic plaque. No significant stenosis of the arch vessel origins. Right carotid system: Patent with predominantly calcified plaque at the carotid bifurcation. No definite evidence of a significant stenosis allowing for streak artifact. Left carotid system: Patent with mixed calcified and soft plaque at the carotid bifurcation. No evidence of a significant stenosis. Vertebral arteries: Patent without evidence of a significant stenosis or dissection. Skeleton: Moderate cervical spondylosis. Other neck: No evidence of cervical lymphadenopathy or mass. Upper chest: More fully evaluated on yesterday's chest CT. Review of the MIP images confirms the above findings CTA HEAD FINDINGS Anterior circulation: The internal carotid arteries are patent from skull base to carotid termini with calcified plaque resulting in mild left  cavernous and bilateral paraclinoid stenoses. ACAs and MCAs are patent without evidence of a significant proximal stenosis. There is a mildly decreased number of distal right MCA branch vessels in the region of the dominant recent infarct, however no proximal branch occlusion is evident. No aneurysm is identified. Posterior circulation: The intracranial vertebral arteries are patent to the basilar with the left being mildly dominant. There is mild atherosclerotic irregularity of both V4 segments without significant stenosis. Patent left PICA, right AICA, and bilateral SCA origins are visualized. The basilar artery is widely patent. Posterior communicating arteries are diminutive or absent. Both PCAs are patent without evidence of a significant proximal stenosis. No aneurysm is identified. Venous sinuses: Patent. Anatomic variants: None. Review of the MIP images confirms the above findings IMPRESSION: 1. No large vessel occlusion. 2. Intracranial atherosclerosis with mild bilateral ICA stenoses. 3. Cervical carotid atherosclerosis without significant stenosis. 4.  Aortic Atherosclerosis (ICD10-I70.0). Electronically Signed   By: Sebastian Ache M.D.   On: 07/31/2023 11:29   EEG adult Result Date: 07/31/2023 Charlsie Quest, MD     07/31/2023 10:18 AM Patient Name: ANIHYA FLORKOWSKI MRN: [id number] Epilepsy Attending: Charlsie Quest Referring Physician/Provider: Milon Dikes, MD Date: 07/31/2023 Duration: 28.18 mins Patient history: 66yo F with an episode of altered mental status getting eeg to evaluate for seizure Level of alertness: Awake, asleep AEDs during EEG study: None Technical aspects: This EEG study was done with scalp electrodes positioned according to the 10-20 International system of electrode placement. Electrical activity was reviewed with band pass filter of 1-70Hz , sensitivity of 7 uV/mm, display speed of 35mm/sec with a 60Hz  notched filter applied as appropriate. EEG data were recorded  continuously and digitally stored.  Video monitoring was  available and reviewed as appropriate. Description: The posterior dominant rhythm consists of 9 Hz activity of moderate voltage (25-35 uV) seen predominantly in posterior head regions, asymmetric ( right<left) and reactive to eye opening and eye closing. Sleep was characterized by vertex waves, sleep spindles (12 to 14 Hz), maximal frontocentral region.  EEG showed continuous 3-5hz  theta- delta slowing in right posterior quadrant.  Intermittent generalized 3-5Hz  theta- delta slowing was also noted.  Photic driving was not seen during photic stimulation.  Hyperventilation was not not performed. ABNORMALITY -Continuous slow, right posterior quadrant -Intermittent slow, generalized IMPRESSION: This study is suggestive of cortical dysfunction in right posterior quadrant likely secondary to underlying stroke.  Additionally there is mild diffuse encephalopathy.  No seizures or epileptiform discharges were seen throughout the recording. Charlsie Quest   MR Brain W and Wo Contrast Result Date: 07/30/2023 CLINICAL DATA:  New onset headache EXAM: MRI HEAD WITHOUT AND WITH CONTRAST TECHNIQUE: Multiplanar, multiecho pulse sequences of the brain and surrounding structures were obtained without and with intravenous contrast. CONTRAST:  8mL GADAVIST GADOBUTROL 1 MMOL/ML IV SOLN COMPARISON:  No prior MRI available, correlation is made with CT head 07/30/2023 FINDINGS: Brain: Restricted diffusion with ADC correlate in the right posterior temporal and lateral occipital and parietal cortex (series 3, images 22-33), which is associated with increased T2 hyperintense signal, gyral swelling, and hemosiderin deposition, likely petechial hemorrhage. Additional punctate foci of restricted diffusion with ADC correlate in the right cerebellum (series 3, image 8), left temporal lobe (series 3, image 19), left occipital lobe (series 3, image 22), right occipital lobe (series 3,  images 23), right parietal lobe (series 3, images 29-41), right frontal lobe (series 3, image 28 and 30), right thalamus (series 3, image 29), and left frontal lobe (series 3, images 25 and 33-34); these foci are both in the white matter and cortex. No abnormal parenchymal or meningeal enhancement. No mass, mass effect, or midline shift. No hydrocephalus or extra-axial collection. Pituitary and craniocervical junction within normal limits. Vascular: Normal arterial flow voids. Normal arterial and venous enhancement. Skull and upper cervical spine: Normal marrow signal. Sinuses/Orbits: Clear paranasal sinuses. No acute finding in the orbits. Other: The mastoid air cells are well aerated. IMPRESSION: 1. Acute to early subacute infarct in the right posterior temporal and lateral occipital and parietal cortex, with associated edema and petechial hemorrhage. 2. Additional punctate acute infarcts in the right cerebellum, left temporal lobe, left occipital lobe, right occipital lobe, right parietal lobe, right frontal lobe, right thalamus, and left frontal lobe. These foci are both in the white matter and cortex. The distribution is suggestive of a central embolic phenomenon. These results will be called to the ordering clinician or representative by the Radiologist Assistant, and communication documented in the PACS or [employer]. Electronically Signed   By: Wiliam Ke M.D.   On: 07/30/2023 22:55    Labs:  CBC: Recent Labs    07/31/23 0222 07/31/23 0935 07/31/23 1355 07/31/23 2014 08/01/23 0315 08/02/23 0303 08/03/23 0337  WBC 15.7* 16.7*  --   --   --  8.3 6.7  HGB 5.3* 9.1*   < > 7.2* 8.3* 7.9* 7.4*  HCT 18.5* 28.4*   < > 22.7* 24.7* 23.5* 23.1*  PLT 155 135*  --   --   --  87* 84*   < > = values in this interval not displayed.    COAGS: Recent Labs    07/31/23 0222  INR 1.4*    BMP: Recent  Labs    07/31/23 1355 07/31/23 2014 07/31/23 2140 08/01/23 0315  NA 139 141 140 137   K 3.7 3.8 3.5 3.4*  CL 110 110 111 108  CO2 20* 20* 23 23  GLUCOSE 153* 164* 145* 104*  BUN 25* 25* 23 24*  CALCIUM 7.8* 7.9* 7.6* 7.4*  CREATININE 0.58 0.58 0.62 0.59  GFRNONAA >60 >60 >60 >60    LIVER FUNCTION TESTS: Recent Labs    05/16/23 1246 07/30/23 1219 07/31/23 0222  BILITOT 0.5 0.7 0.6  AST 25 29 43*  ALT 21 24 34  ALKPHOS 78 59 35*  PROT 7.3 6.0* 3.9*  ALBUMIN 3.8 3.2* 2.2*    Assessment and Plan: GI bleed Patient admitted after fall with confusion/aphasia at home concerning for acute stroke.  MR Brain did show evidence of acute infarcts, largest in the right parietoccipital junction.   The following morning, she had 2 episodes of BRBPR and small volume hematemesis with hypotension. Hgb 5.1. CTA/BRTO Abdomen Pelvis showed hepatic cirrhosis, with mildly prominent hepatic portal main vein and splenic vein, and gastric and splenorenal varices. IR was consulted for TIPS however patient responded well to transfusion, octreotide, PPI, abx.  Despite no evidence of further bleeding or melena her hemoglobin has continued to drop from 9.1 to 7.5 this AM.  Given her ongoing anemia, plan now made to proceed with urgent TIPS this weekend.  IR remains available should more emergent intervention be required.  Patient and family aware and agreeable to TIPS.   Risks and benefits of TIPS, BRTO and/or additional variceal embolization were discussed with the patient and/or the patient's family including, but not limited to, infection, bleeding, damage to adjacent structures, worsening hepatic and/or cardiac function, worsening and/or the development of altered mental status/encephalopathy, non-target embolization and death.   This interventional procedure involves the use of X-rays and because of the nature of the planned procedure, it is possible that we will have prolonged use of X-ray fluoroscopy.  Potential radiation risks to you include (but are not limited to) the following: -  A slightly elevated risk for cancer  several years later in life. This risk is typically less than 0.5% percent. This risk is low in comparison to the normal incidence of human cancer, which is 33% for women and 50% for men according to the [employer]. - Radiation induced injury can include skin redness, resembling a rash, tissue breakdown / ulcers and hair loss (which can be temporary or permanent).   The likelihood of either of these occurring depends on the difficulty of the procedure and whether you are sensitive to radiation due to previous procedures, disease, or genetic conditions.   IF your procedure requires a prolonged use of radiation, you will be notified and given written instructions for further action.  It is your responsibility to monitor the irradiated area for the 2 weeks following the procedure and to notify your physician if you are concerned that you have suffered a radiation induced injury.    All of the patient's questions were answered, patient is agreeable to proceed.  Consent signed and in chart.  Electronically Signed: Hoyt Koch, PA 08/03/2023, 2:04 PM   I spent a total of 25 Minutes at the the patient's bedside AND on the patient's hospital floor or unit, greater than 50% of which was counseling/coordinating care for GI bleed.

## 2023-08-03 NOTE — Progress Notes (Signed)
STROKE TEAM PROGRESS NOTE   SUBJECTIVE (INTERVAL HISTORY) No family is at the bedside. Pt is awake alert sitting in chair. TEE cancelled yesterday and pending TIPS on Monday. Currently Hb and platelet still dropping slowly.    OBJECTIVE Temp:  [97.8 F (36.6 C)-98.8 F (37.1 C)] 98.1 F (36.7 C) (12/27 1158) Pulse Rate:  [76-83] 82 (12/27 0837) Cardiac Rhythm: Normal sinus rhythm (12/27 0700) Resp:  [14-20] 17 (12/27 1158) BP: (124-155)/(50-77) 153/64 (12/27 1158) SpO2:  [93 %-95 %] 95 % (12/27 0343)  Recent Labs  Lab 08/02/23 2029 08/03/23 0000 08/03/23 0414 08/03/23 0838 08/03/23 1159  GLUCAP 177* 133* 125* 138* 154*   Recent Labs  Lab 07/30/23 1219 07/30/23 1508 07/31/23 0222 07/31/23 0935 07/31/23 1355 07/31/23 2014 07/31/23 2140 08/01/23 0315 08/01/23 0317  NA 131*   < > 133*  --  139 141 140 137  --   K 4.3   < > 4.3  --  3.7 3.8 3.5 3.4*  --   CL 103  --  105  --  110 110 111 108  --   CO2 18*  --  15*  --  20* 20* 23 23  --   GLUCOSE 433*  --  472*  --  153* 164* 145* 104*  --   BUN 13  --  34*  --  25* 25* 23 24*  --   CREATININE 0.74  --  0.77  --  0.58 0.58 0.62 0.59  --   CALCIUM 8.5*  --  7.5*  --  7.8* 7.9* 7.6* 7.4*  --   MG 1.8  --   --  1.7  --   --   --   --  2.0  PHOS  --   --   --  3.5  --   --   --   --   --    < > = values in this interval not displayed.   Recent Labs  Lab 07/30/23 1219 07/31/23 0222  AST 29 43*  ALT 24 34  ALKPHOS 59 35*  BILITOT 0.7 0.6  PROT 6.0* 3.9*  ALBUMIN 3.2* 2.2*   Recent Labs  Lab 07/30/23 1219 07/30/23 1508 07/31/23 0222 07/31/23 0935 07/31/23 1355 07/31/23 2014 08/01/23 0315 08/02/23 0303 08/03/23 0337  WBC 19.7*  --  15.7* 16.7*  --   --   --  8.3 6.7  NEUTROABS  --   --  10.7*  --   --   --   --   --   --   HGB 10.3*   < > 5.3* 9.1* 8.4* 7.2* 8.3* 7.9* 7.4*  HCT 36.1   < > 18.5* 28.4* 25.6* 22.7* 24.7* 23.5* 23.1*  MCV 66.6*  --  66.1* 73.6*  --   --   --  75.8* 78.0*  PLT 188  --  155  135*  --   --   --  87* 84*   < > = values in this interval not displayed.   Recent Labs  Lab 07/30/23 1444  CKTOTAL 69   No results for input(s): "LABPROT", "INR" in the last 72 hours.  No results for input(s): "COLORURINE", "LABSPEC", "PHURINE", "GLUCOSEU", "HGBUR", "BILIRUBINUR", "KETONESUR", "PROTEINUR", "UROBILINOGEN", "NITRITE", "LEUKOCYTESUR" in the last 72 hours.  Invalid input(s): "APPERANCEUR"      Component Value Date/Time   CHOL 104 07/31/2023 0222   TRIG 89 07/31/2023 0222   HDL 23 (L) 07/31/2023 0222   CHOLHDL 4.5 07/31/2023 0222  VLDL 18 07/31/2023 0222   LDLCALC 63 07/31/2023 0222   Lab Results  Component Value Date   HGBA1C 11.4 (H) 07/30/2023   No results found for: "LABOPIA", "COCAINSCRNUR", "LABBENZ", "AMPHETMU", "THCU", "LABBARB"  No results for input(s): "ETH" in the last 168 hours.  I have personally reviewed the radiological images below and agree with the radiology interpretations.  CT ANGIO ABD/PELVIS BRTO Result Date: 08/01/2023 CLINICAL DATA:  Gastric varices, with liver cirrhosis on the recent CT. EXAM: CTA ABDOMEN AND PELVIS WITHOUT AND WITH CONTRAST TECHNIQUE: Multidetector CT imaging of the abdomen and pelvis was performed using the standard protocol during bolus administration of intravenous contrast. Multiplanar reconstructed images and MIPs were obtained and reviewed to evaluate the vascular anatomy. RADIATION DOSE REDUCTION: This exam was performed according to the departmental dose-optimization program which includes automated exposure control, adjustment of the mA and/or kV according to patient size and/or use of iterative reconstruction technique. CONTRAST:  75mL OMNIPAQUE IOHEXOL 350 MG/ML SOLN COMPARISON:  CT chest, abdomen and pelvis with IV contrast, done for blunt polytrauma, yesterday at 1:29 p.m. FINDINGS: VASCULAR Aorta: There are moderate to heavy aortic mixed plaques without aneurysm, stenosis or dissection, or penetrating aortic  ulcer. Celiac: Patent without evidence of aneurysm, dissection, vasculitis or significant stenosis. SMA: 40% non flow-limiting soft plaque vessel origin stenosis. The vessel otherwise opacifies well. No branch occlusion. Renals: There are calcifications at both renal artery origins but no flow-limiting stenosis, aneurysm or dissection. Both renal arteries are single. IMA: There is a 75% mixed plaque vessel origin stenosis. The vessel otherwise opacify as well. Inflow: There are moderate calcific plaques especially of the common iliac and internal iliac arteries, but no flow-limiting stenosis. Proximal Outflow: There are calcific/mixed plaques in both common femoral arteries, right proximal superficial femoral artery, but no flow-limiting stenosis of the visualized outflow arteries. Veins: Patent. Mild prominence of the hepatic portal main vein measuring 14.5 mm. No deep pelvic vein or IVC filling defects. In the venous phase, venous varices are again shown along the posterior proximal gastric wall, with splenorenal varices posterior to the. There is no recanalization of the umbilical vein. There are no distal esophageal varices. Review of the MIP images confirms the above findings. NON-VASCULAR Lower chest: There is subpleural reticulation and scattered subpleural bronchiolectasis in the lung bases with mild peripheral faint ground-glass disease, findings consistent with chronic interstitial lung disease. The cardiac size is normal. The inferior mitral ring is heavily calcified. No pericardial effusion. The cardiac blood pool is less dense than the myocardium suggesting anemia. Hepatobiliary: 20 cm in length, mildly steatotic, with nodular capsular contour consistent with cirrhosis. No enhancing liver masses seen. There is excreted contrast filling the gallbladder. There are no stones or wall thickening but there is interval new pericholecystic edema and trace pericholecystic fluid. In this case it is probably  congestive or related to fluid overload given the hepatic cirrhosis, but please correlate clinically for acalculous cholecystitis. There is no bile duct dilatation. Pancreas: No abnormality. Spleen: Mildly prominent spleen 13.8 cm craniocaudal. No focal lesion. Mildly engorged splenic vein. Adrenals/Urinary Tract: No abnormality. Stomach/Bowel: Contracted stomach, unremarkable apart from the gastric varices. Normal caliber small intestine and appendix. Mild wall thickening has developed in the ascending, transverse and proximal descending colon. There is sigmoid diverticulosis but no evidence of diverticulitis. The wall thickening could be congestive, related to hepatic dysfunction, or due to colitis. Stranding changes have also developed over portions of the thickened colon. No vascular contrast extravasation is seen into  the GI tract. Lymphatic: No adenopathy. Reproductive: Uterus and bilateral adnexa are unremarkable. Other: No abdominal wall hernia or abnormality. No abdominopelvic ascites. Musculoskeletal: Levoscoliosis and advanced degenerative change lumbar spine. Most advanced at L1-2 through L3-4 with acquired spinal stenosis L3-4 and L4-5. There is osteopenia and additional degenerative change in the thoracic spine. IMPRESSION: 1. Aortic and branch vessel atherosclerosis without aneurysm, dissection or flow-limiting aortic stenosis. 2. 40% non flow-limiting soft plaque stenosis of the SMA. 3. 75% mixed plaque stenosis of the IMA. 4. Chronic interstitial lung disease in the lung bases. 5. Likely anemia. 6. Hepatic cirrhosis, with mildly prominent hepatic portal main vein and splenic vein, and gastric and splenorenal varices. No recanalization of the umbilical vein or distal esophageal varices. 7. Mildly prominent spleen. 8. Interval development of pericholecystic edema and trace pericholecystic fluid. In this case it is probably congestive or related to fluid overload given the hepatic cirrhosis, but please  correlate clinically for acalculous cholecystitis. 9. Interval development of mild wall thickening in the ascending, transverse and proximal descending colon, with stranding changes over portions of the thickened colon. This could be congestive, related to hepatic dysfunction, or due to colitis. 10. Diverticulosis without evidence of diverticulitis. 11. Osteopenia, lumbar scoliosis, and degenerative change. Aortic Atherosclerosis (ICD10-I70.0). Electronically Signed   By: Almira Bar M.D.   On: 08/01/2023 07:24   DG Chest Port 1 View Result Date: 08/01/2023 CLINICAL DATA:  [id number] with respiratory failure. EXAM: PORTABLE CHEST 1 VIEW COMPARISON:  Chest CT 07/30/2023 FINDINGS: The cardiomediastinal silhouette is normal. There is aortic atherosclerosis. There is subpleural reticulation with a basal gradient. No active infiltrate is seen. No substantial pleural effusion. Osteopenia with no acute skeletal findings. No consolidation or other change in overall aeration is seen. IMPRESSION: 1. No evidence of acute chest disease. 2. Aortic atherosclerosis. 3. Subpleural reticulation with a basal gradient. Electronically Signed   By: Almira Bar M.D.   On: 08/01/2023 06:57   ECHOCARDIOGRAM COMPLETE BUBBLE STUDY Result Date: 07/31/2023    ECHOCARDIOGRAM REPORT   Patient Name:   Maria Rose Date of Exam: 07/31/2023 Medical Rec #:  [id number]       Height:       66.0 in Accession #:    [id number]      Weight:       180.0 lb Date of Birth:  30-Dec-1955        BSA:          1.912 m Patient Age:    67 years        BP:           124/51 mmHg Patient Gender: F               HR:           105 bpm. Exam Location:  Inpatient Procedure: 2D Echo, Cardiac Doppler, Color Doppler and Saline Contrast Bubble            Study Indications:    Stroke  History:        Patient has no prior history of Echocardiogram examinations.                 Risk Factors:Current Smoker.  Sonographer:    Karma Ganja Referring Phys: [id number] JEFFREY C  HATCHER  Sonographer Comments: Technically challenging study due to limited acoustic windows. Image acquisition challenging due to patient body habitus and Image acquisition challenging due to uncooperative patient. IMPRESSIONS  1. Left ventricular ejection fraction, by estimation, is  60 to 65%. The left ventricle has normal function. The left ventricle has no regional wall motion abnormalities. Left ventricular diastolic parameters are consistent with Grade I diastolic dysfunction (impaired relaxation).  2. Right ventricular systolic function is normal. The right ventricular size is normal.  3. Suboptimal bubble study- no shunting seen.  4. The mitral valve is degenerative. No evidence of mitral valve regurgitation.  5. The aortic valve was not well visualized. Aortic valve regurgitation is not visualized. No aortic stenosis is present. Comparison(s): No prior Echocardiogram. FINDINGS  Left Ventricle: Left ventricular ejection fraction, by estimation, is 60 to 65%. The left ventricle has normal function. The left ventricle has no regional wall motion abnormalities. The left ventricular internal cavity size was normal in size. There is  no left ventricular hypertrophy. Left ventricular diastolic parameters are consistent with Grade I diastolic dysfunction (impaired relaxation). Right Ventricle: The right ventricular size is normal. No increase in right ventricular wall thickness. Right ventricular systolic function is normal. Left Atrium: Left atrial size was normal in size. Right Atrium: Right atrial size was normal in size. Pericardium: There is no evidence of pericardial effusion. Presence of epicardial fat layer. Mitral Valve: The mitral valve is degenerative in appearance. Mild mitral annular calcification. No evidence of mitral valve regurgitation. MV peak gradient, 4.0 mmHg. The mean mitral valve gradient is 2.0 mmHg. Tricuspid Valve: The tricuspid valve is grossly normal. Tricuspid valve regurgitation is not  demonstrated. Aortic Valve: The aortic valve was not well visualized. Aortic valve regurgitation is not visualized. No aortic stenosis is present. Aortic valve mean gradient measures 5.0 mmHg. Aortic valve peak gradient measures 9.9 mmHg. Aortic valve area, by VTI measures 2.26 cm. Pulmonic Valve: The pulmonic valve was not well visualized. Pulmonic valve regurgitation is not visualized. No evidence of pulmonic stenosis. Aorta: The aortic root is normal in size and structure. IAS/Shunts: The interatrial septum was not well visualized. Agitated saline contrast was given intravenously to evaluate for intracardiac shunting.  LEFT VENTRICLE PLAX 2D LVIDd:         5.00 cm   Diastology LVIDs:         3.30 cm   LV e' medial:    7.77 cm/s LV PW:         1.10 cm   LV E/e' medial:  10.4 LV IVS:        1.10 cm   LV e' lateral:   6.53 cm/s LVOT diam:     2.00 cm   LV E/e' lateral: 12.3 LV SV:         53 LV SV Index:   28 LVOT Area:     3.14 cm  RIGHT VENTRICLE RV S prime:     15.90 cm/s TAPSE (M-mode): 2.7 cm LEFT ATRIUM             Index LA diam:        4.30 cm 2.25 cm/m LA Vol (A2C):   37.1 ml 19.40 ml/m LA Vol (A4C):   53.2 ml 27.82 ml/m LA Biplane Vol: 44.0 ml 23.01 ml/m  AORTIC VALVE AV Area (Vmax):    2.28 cm AV Area (Vmean):   2.13 cm AV Area (VTI):     2.26 cm AV Vmax:           157.00 cm/s AV Vmean:          103.000 cm/s AV VTI:            0.234 m AV Peak Grad:  9.9 mmHg AV Mean Grad:      5.0 mmHg LVOT Vmax:         114.00 cm/s LVOT Vmean:        69.800 cm/s LVOT VTI:          0.168 m LVOT/AV VTI ratio: 0.72  AORTA Ao Root diam: 3.40 cm MITRAL VALVE MV Area (PHT): 4.21 cm     SHUNTS MV Area VTI:   2.96 cm     Systemic VTI:  0.17 m MV Peak grad:  4.0 mmHg     Systemic Diam: 2.00 cm MV Mean grad:  2.0 mmHg MV Vmax:       1.00 m/s MV Vmean:      70.0 cm/s MV Decel Time: 180 msec MV E velocity: 80.60 cm/s MV A velocity: 113.00 cm/s MV E/A ratio:  0.71 Riley Lam MD Electronically signed by Riley Lam MD Signature Date/Time: 07/31/2023/2:39:51 PM    Final    CT ANGIO HEAD NECK W WO CM Result Date: 07/31/2023 CLINICAL DATA:  Stroke/TIA, determine embolic source. EXAM: CT ANGIOGRAPHY HEAD AND NECK WITH AND WITHOUT CONTRAST TECHNIQUE: Multidetector CT imaging of the head and neck was performed using the standard protocol during bolus administration of intravenous contrast. Multiplanar CT image reconstructions and MIPs were obtained to evaluate the vascular anatomy. Carotid stenosis measurements (when applicable) are obtained utilizing NASCET criteria, using the distal internal carotid diameter as the denominator. RADIATION DOSE REDUCTION: This exam was performed according to the departmental dose-optimization program which includes automated exposure control, adjustment of the mA and/or kV according to patient size and/or use of iterative reconstruction technique. CONTRAST:  75mL OMNIPAQUE IOHEXOL 350 MG/ML SOLN COMPARISON:  Head CT and MRI 07/30/2023 FINDINGS: CT HEAD FINDINGS Brain: Progressively well-defined cytotoxic edema is present at the junction of the right temporal, occipital, and parietal lobes corresponding to the known acute/early subacute infarct and extending over an area of 4.5-5 cm in diameter, possibly with trace associated petechial hemorrhage. The additional scattered punctate acute cerebral and cerebellar infarcts on MRI are occult by CT. No space-occupying parenchymal hematoma, mass, midline shift, or extra-axial fluid collection is identified. There is a background of mild chronic small vessel ischemia in the cerebral white matter. The ventricles are normal in size. Vascular: Calcified atherosclerosis at the skull base. Skull: No acute fracture or suspicious osseous lesion. Sinuses/Orbits: Paranasal sinuses and mastoid air cells are clear. Unremarkable orbits. Other: None. Review of the MIP images confirms the above findings CTA NECK FINDINGS Aortic arch: Standard branching  with a moderate amount of mixed soft and calcified atherosclerotic plaque. No significant stenosis of the arch vessel origins. Right carotid system: Patent with predominantly calcified plaque at the carotid bifurcation. No definite evidence of a significant stenosis allowing for streak artifact. Left carotid system: Patent with mixed calcified and soft plaque at the carotid bifurcation. No evidence of a significant stenosis. Vertebral arteries: Patent without evidence of a significant stenosis or dissection. Skeleton: Moderate cervical spondylosis. Other neck: No evidence of cervical lymphadenopathy or mass. Upper chest: More fully evaluated on yesterday's chest CT. Review of the MIP images confirms the above findings CTA HEAD FINDINGS Anterior circulation: The internal carotid arteries are patent from skull base to carotid termini with calcified plaque resulting in mild left cavernous and bilateral paraclinoid stenoses. ACAs and MCAs are patent without evidence of a significant proximal stenosis. There is a mildly decreased number of distal right MCA branch vessels in the region of the dominant recent infarct,  however no proximal branch occlusion is evident. No aneurysm is identified. Posterior circulation: The intracranial vertebral arteries are patent to the basilar with the left being mildly dominant. There is mild atherosclerotic irregularity of both V4 segments without significant stenosis. Patent left PICA, right AICA, and bilateral SCA origins are visualized. The basilar artery is widely patent. Posterior communicating arteries are diminutive or absent. Both PCAs are patent without evidence of a significant proximal stenosis. No aneurysm is identified. Venous sinuses: Patent. Anatomic variants: None. Review of the MIP images confirms the above findings IMPRESSION: 1. No large vessel occlusion. 2. Intracranial atherosclerosis with mild bilateral ICA stenoses. 3. Cervical carotid atherosclerosis without  significant stenosis. 4.  Aortic Atherosclerosis (ICD10-I70.0). Electronically Signed   By: Sebastian Ache M.D.   On: 07/31/2023 11:29   EEG adult Result Date: 07/31/2023 Charlsie Quest, MD     07/31/2023 10:18 AM Patient Name: Maria Rose MRN: [id number] Epilepsy Attending: Charlsie Quest Referring Physician/Provider: Milon Dikes, MD Date: 07/31/2023 Duration: 28.18 mins Patient history: 67yo F with an episode of altered mental status getting eeg to evaluate for seizure Level of alertness: Awake, asleep AEDs during EEG study: None Technical aspects: This EEG study was done with scalp electrodes positioned according to the 10-20 International system of electrode placement. Electrical activity was reviewed with band pass filter of 1-70Hz , sensitivity of 7 uV/mm, display speed of 16mm/sec with a 60Hz  notched filter applied as appropriate. EEG data were recorded continuously and digitally stored.  Video monitoring was available and reviewed as appropriate. Description: The posterior dominant rhythm consists of 9 Hz activity of moderate voltage (25-35 uV) seen predominantly in posterior head regions, asymmetric ( right<left) and reactive to eye opening and eye closing. Sleep was characterized by vertex waves, sleep spindles (12 to 14 Hz), maximal frontocentral region.  EEG showed continuous 3-5hz  theta- delta slowing in right posterior quadrant.  Intermittent generalized 3-5Hz  theta- delta slowing was also noted.  Photic driving was not seen during photic stimulation.  Hyperventilation was not not performed. ABNORMALITY -Continuous slow, right posterior quadrant -Intermittent slow, generalized IMPRESSION: This study is suggestive of cortical dysfunction in right posterior quadrant likely secondary to underlying stroke.  Additionally there is mild diffuse encephalopathy.  No seizures or epileptiform discharges were seen throughout the recording. Charlsie Quest   MR Brain W and Wo Contrast Result Date:  07/30/2023 CLINICAL DATA:  New onset headache EXAM: MRI HEAD WITHOUT AND WITH CONTRAST TECHNIQUE: Multiplanar, multiecho pulse sequences of the brain and surrounding structures were obtained without and with intravenous contrast. CONTRAST:  8mL GADAVIST GADOBUTROL 1 MMOL/ML IV SOLN COMPARISON:  No prior MRI available, correlation is made with CT head 07/30/2023 FINDINGS: Brain: Restricted diffusion with ADC correlate in the right posterior temporal and lateral occipital and parietal cortex (series 3, images 22-33), which is associated with increased T2 hyperintense signal, gyral swelling, and hemosiderin deposition, likely petechial hemorrhage. Additional punctate foci of restricted diffusion with ADC correlate in the right cerebellum (series 3, image 8), left temporal lobe (series 3, image 19), left occipital lobe (series 3, image 22), right occipital lobe (series 3, images 23), right parietal lobe (series 3, images 29-41), right frontal lobe (series 3, image 28 and 30), right thalamus (series 3, image 29), and left frontal lobe (series 3, images 25 and 33-34); these foci are both in the white matter and cortex. No abnormal parenchymal or meningeal enhancement. No mass, mass effect, or midline shift. No hydrocephalus or extra-axial collection. Pituitary and craniocervical  junction within normal limits. Vascular: Normal arterial flow voids. Normal arterial and venous enhancement. Skull and upper cervical spine: Normal marrow signal. Sinuses/Orbits: Clear paranasal sinuses. No acute finding in the orbits. Other: The mastoid air cells are well aerated. IMPRESSION: 1. Acute to early subacute infarct in the right posterior temporal and lateral occipital and parietal cortex, with associated edema and petechial hemorrhage. 2. Additional punctate acute infarcts in the right cerebellum, left temporal lobe, left occipital lobe, right occipital lobe, right parietal lobe, right frontal lobe, right thalamus, and left frontal  lobe. These foci are both in the white matter and cortex. The distribution is suggestive of a central embolic phenomenon. These results will be called to the ordering clinician or representative by the Radiologist Assistant, and communication documented in the PACS or [employer]. Electronically Signed   By: Wiliam Ke M.D.   On: 07/30/2023 22:55   CT CHEST ABDOMEN PELVIS W CONTRAST Result Date: 07/30/2023 CLINICAL DATA:  Trauma EXAM: CT CHEST, ABDOMEN, AND PELVIS WITH CONTRAST TECHNIQUE: Multidetector CT imaging of the chest, abdomen and pelvis was performed following the standard protocol during bolus administration of intravenous contrast. RADIATION DOSE REDUCTION: This exam was performed according to the departmental dose-optimization program which includes automated exposure control, adjustment of the mA and/or kV according to patient size and/or use of iterative reconstruction technique. CONTRAST:  75mL OMNIPAQUE IOHEXOL 350 MG/ML SOLN COMPARISON:  None Available. FINDINGS: CT CHEST FINDINGS Cardiovascular: Severe aortic atherosclerosis. Normal heart size. No pericardial effusion. Mediastinum/Nodes: No enlarged mediastinal, hilar, or axillary lymph nodes. Thyroid gland, trachea, and esophagus demonstrate no significant findings. Lungs/Pleura: Mild centrilobular emphysema. Mild pulmonary fibrosis in a pattern with apical to basal gradient featuring irregular peripheral interstitial opacity and septal thickening with minimal traction bronchiectasis and minimal areas of subpleural bronchiolectasis of the lung bases. Small irregular nodule of the posterior left apex measuring 0.6 cm (series 6, image 69). No pleural effusion or pneumothorax. Musculoskeletal: No chest wall abnormality. No acute osseous findings. CT ABDOMEN PELVIS FINDINGS Hepatobiliary: No solid liver abnormality is seen. Coarse, nodular cirrhotic morphology of the liver. No gallstones, gallbladder wall thickening, or biliary dilatation.  Pancreas: Unremarkable. No pancreatic ductal dilatation or surrounding inflammatory changes. Spleen: Normal in size without significant abnormality. Adrenals/Urinary Tract: Adrenal glands are unremarkable. Kidneys are normal, without renal calculi, solid lesion, or hydronephrosis. Bladder is unremarkable. Stomach/Bowel: Stomach is within normal limits. Appendix appears normal. No evidence of bowel wall thickening, distention, or inflammatory changes. Sigmoid diverticulosis. Vascular/Lymphatic: Severe aortic atherosclerosis. 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 No enlarged abdominal or pelvic lymph nodes. Reproductive: No mass or other abnormality. Other: No abdominal wall hernia or abnormality. No ascites. Musculoskeletal: No acute osseous findings. IMPRESSION: 1. No CT evidence of acute traumatic injury to the chest, abdomen, or pelvis. 2. Mild pulmonary fibrosis in a pattern with apical to basal gradient featuring irregular peripheral interstitial opacity and septal thickening with minimal traction bronchiectasis and minimal areas of subpleural bronchiolectasis of the lung bases. 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 3. Small irregular nodule of the posterior left apex measuring 0.6 cm. Non-contrast chest CT at 6-12 months is recommended. If the nodule is stable at time of repeat CT, then future CT at 18-24 months (from today's scan) is considered optional for low-risk patients, but is recommended for high-risk patients. This recommendation follows the consensus statement: Guidelines for Management of Incidental Pulmonary Nodules Detected on CT Images: From the Fleischner Society  2017; Radiology 2017; 284:228-243. 4. Cirrhosis. 5. 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 6. Sigmoid diverticulosis without evidence of acute diverticulitis. Aortic Atherosclerosis (ICD10-I70.0) and Emphysema (ICD10-J43.9). Electronically Signed   By:  Jearld Lesch M.D.   On: 07/30/2023 14:18   CT HEAD WO CONTRAST Result Date: 07/30/2023 CLINICAL DATA:  Trauma EXAM: CT HEAD WITHOUT CONTRAST CT CERVICAL SPINE WITHOUT CONTRAST TECHNIQUE: Multidetector CT imaging of the head and cervical spine was performed following the standard protocol without intravenous contrast. Multiplanar CT image reconstructions of the cervical spine were also generated. RADIATION DOSE REDUCTION: This exam was performed according to the departmental dose-optimization program which includes automated exposure control, adjustment of the mA and/or kV according to patient size and/or use of iterative reconstruction technique. COMPARISON:  None Available. FINDINGS: CT HEAD FINDINGS Brain: Cortical based hypodensity of the right parieto-occipital junction measuring 2.4 x 2.2 cm (series 11, image 25, series 9, image 24). No evidence of hemorrhage, hydrocephalus, extra-axial collection or mass effect. Vascular: No hyperdense vessel or unexpected calcification. Skull: Normal. Negative for fracture or focal lesion. Sinuses/Orbits: No acute finding. Other: Soft tissue contusion of the left scalp vertex (series 11, image 7) CT CERVICAL SPINE FINDINGS Alignment: Degenerative straightening of the normal cervical lordosis. Skull base and vertebrae: No acute fracture. No primary bone lesion or focal pathologic process. Soft tissues and spinal canal: No prevertebral fluid or swelling. No visible canal hematoma. Disc levels: Moderate disc space height loss and osteophytosis from C5-C7 Upper chest: Negative. Other: None. IMPRESSION: 1. Cortical based hypodensity of the right parieto-occipital junction measuring 2.4 x 2.2 cm. This is concerning for acute or subacute infarct or alternately underlying mass. Recommend MRI for further evaluation. 2. No evidence of hemorrhage or mass effect. 3. Soft tissue contusion of the left scalp vertex. 4. No fracture or static subluxation of the cervical spine. 5.  Moderate disc degenerative disease from C5-C7. Electronically Signed   By: Jearld Lesch M.D.   On: 07/30/2023 14:13   CT CERVICAL SPINE WO CONTRAST Result Date: 07/30/2023 CLINICAL DATA:  Trauma EXAM: CT HEAD WITHOUT CONTRAST CT CERVICAL SPINE WITHOUT CONTRAST TECHNIQUE: Multidetector CT imaging of the head and cervical spine was performed following the standard protocol without intravenous contrast. Multiplanar CT image reconstructions of the cervical spine were also generated. RADIATION DOSE REDUCTION: This exam was performed according to the departmental dose-optimization program which includes automated exposure control, adjustment of the mA and/or kV according to patient size and/or use of iterative reconstruction technique. COMPARISON:  None Available. FINDINGS: CT HEAD FINDINGS Brain: Cortical based hypodensity of the right parieto-occipital junction measuring 2.4 x 2.2 cm (series 11, image 25, series 9, image 24). No evidence of hemorrhage, hydrocephalus, extra-axial collection or mass effect. Vascular: No hyperdense vessel or unexpected calcification. Skull: Normal. Negative for fracture or focal lesion. Sinuses/Orbits: No acute finding. Other: Soft tissue contusion of the left scalp vertex (series 11, image 7) CT CERVICAL SPINE FINDINGS Alignment: Degenerative straightening of the normal cervical lordosis. Skull base and vertebrae: No acute fracture. No primary bone lesion or focal pathologic process. Soft tissues and spinal canal: No prevertebral fluid or swelling. No visible canal hematoma. Disc levels: Moderate disc space height loss and osteophytosis from C5-C7 Upper chest: Negative. Other: None. IMPRESSION: 1. Cortical based hypodensity of the right parieto-occipital junction measuring 2.4 x 2.2 cm. This is concerning for acute or subacute infarct or alternately underlying mass. Recommend MRI for further evaluation. 2. No evidence of hemorrhage or mass effect. 3.  Soft tissue contusion of the left  scalp vertex. 4. No fracture or static subluxation of the cervical spine. 5. Moderate disc degenerative disease from C5-C7. Electronically Signed   By: Jearld Lesch M.D.   On: 07/30/2023 14:13     PHYSICAL EXAM  Temp:  [97.8 F (36.6 C)-98.8 F (37.1 C)] 98.1 F (36.7 C) (12/27 1158) Pulse Rate:  [76-83] 82 (12/27 0837) Resp:  [14-20] 17 (12/27 1158) BP: (124-155)/(50-77) 153/64 (12/27 1158) SpO2:  [93 %-95 %] 95 % (12/27 0343)  General - Well nourished, well developed, in no apparent distress.  Ophthalmologic - fundi not visualized due to noncooperation.  Cardiovascular - Regular rhythm and rate.  Mental Status -  Level of arousal and orientation to time, place, and person were intact. Language including expression, naming, repetition, comprehension was assessed and found intact. Fund of Knowledge was assessed and was intact.  Cranial Nerves II - XII - II - Visual field intact OU, however, left visual field decreased visual acuity with intermittent simultagnosia. III, IV, VI - Extraocular movements intact. V - Facial sensation intact bilaterally. VII - Facial movement intact bilaterally. VIII - Hearing & vestibular intact bilaterally. X - Palate elevates symmetrically. XI - Chin turning & shoulder shrug intact bilaterally. XII - Tongue protrusion intact.  Motor Strength - The patient's strength was normal in all extremities and pronator drift was absent.  Bulk was normal and fasciculations were absent.   Motor Tone - Muscle tone was assessed at the neck and appendages and was normal.  Reflexes - The patient's reflexes were symmetrical in all extremities and she had no pathological reflexes.  Sensory - Light touch, temperature/pinprick were assessed and were symmetrical.    Coordination - The patient had normal movements in the hands and feet with no ataxia or dysmetria.  Tremor was absent.  Gait and Station - deferred.    ASSESSMENT/PLAN Ms. Maria Rose is a 67  y.o. female with history of nasal cellulitis on Abx admitted for SOB, syncope, hyperglycemia, speech difficulty, AMS with elevated lactic acid. No tPA given due to outside window.    Stroke:  bilateral embolic shower, embolic pattern, etiology unclear, needs to rule out endocarditis CT right parietooccipital infarct CTA head and neck mild bilateral ICA stenoses.  MRI  Acute to early subacute infarct in the right posterior temporal and lateral occipital and parietal cortex, with associated edema and petechial hemorrhage. Additional punctate acute infarcts in the right cerebellum, left temporal lobe, left occipital lobe, right occipital lobe, right parietal lobe, right frontal lobe, right thalamus, and left frontal lobe. These foci are both in the white matter and cortex. 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 Pt does have elevated INR, mild thrombocytopenia  GIB with severe anemia s/p PRBC 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 On octreotide, PPI IV and empiric Abx  IR plan for TIPS on Monday  Severe anemia  S/p PRBC infusion Hb 10.3->5.3->PRBC->9.1->7.2->PRBC->8.3->7.9->7.4 GI on board, EGD showed gastric and duodenum bleeding S/p hemostatic spray Close monitoring  Diabetes, new diagnosis HgbA1c 11.4 goal <  7.0 Currently on insulin drip CBG monitoring SSI DM education and close PCP follow up  Leukocytosis Elevated lactic acid WBC 19.7->15.7->16.7->8.3->6.7 Afebrile Elevated LA UA neg Blood  culture NGTD Concerning for sepsis, on Rocephin, flagyl and vancomycin -> Rocephine Recommend TEE to rule out endocarditis  BP management BP Stable Avoid low BP Long term BP goal normotensive  Lipid management Home meds:  none  LDL 63, goal < 70 no statin needed given LDL at goal  Other Stroke Risk Factors Advanced age  Other Active Problems Thrombocytopenia - 155->135->87->84  Hospital day # 3  Neurology will follow peripherally. Please call us back once TEE done. Pt will follow up with stroke clinic NP at [facility] in about 4 weeks. Thanks for the consult.   Marvel Plan, MD PhD Stroke Neurology 08/03/2023 12:34 PM    To contact Stroke Continuity provider, please refer to [email or web address]. After hours, contact General Neurology

## 2023-08-04 DIAGNOSIS — K7460 Unspecified cirrhosis of liver: Secondary | ICD-10-CM | POA: Diagnosis not present

## 2023-08-04 DIAGNOSIS — K7290 Hepatic failure, unspecified without coma: Secondary | ICD-10-CM

## 2023-08-04 DIAGNOSIS — I6310 Cerebral infarction due to embolism of unspecified precerebral artery: Secondary | ICD-10-CM

## 2023-08-04 LAB — CBC
HCT: 23.1 % — ABNORMAL LOW (ref 36.0–46.0)
Hemoglobin: 7.5 g/dL — ABNORMAL LOW (ref 12.0–15.0)
MCH: 25.3 pg — ABNORMAL LOW (ref 26.0–34.0)
MCHC: 32.5 g/dL (ref 30.0–36.0)
MCV: 78 fL — ABNORMAL LOW (ref 80.0–100.0)
Platelets: 99 10*3/uL — ABNORMAL LOW (ref 150–400)
RBC: 2.96 MIL/uL — ABNORMAL LOW (ref 3.87–5.11)
RDW: 27.6 % — ABNORMAL HIGH (ref 11.5–15.5)
WBC: 7.7 10*3/uL (ref 4.0–10.5)
nRBC: 0.5 % — ABNORMAL HIGH (ref 0.0–0.2)

## 2023-08-04 LAB — BASIC METABOLIC PANEL
Anion gap: 10 (ref 5–15)
BUN: 7 mg/dL — ABNORMAL LOW (ref 8–23)
CO2: 25 mmol/L (ref 22–32)
Calcium: 8.2 mg/dL — ABNORMAL LOW (ref 8.9–10.3)
Chloride: 104 mmol/L (ref 98–111)
Creatinine, Ser: 0.52 mg/dL (ref 0.44–1.00)
GFR, Estimated: >60 mL/min (ref >60)
Glucose, Bld: 101 mg/dL — ABNORMAL HIGH (ref 70–99)
Potassium: 3.4 mmol/L — ABNORMAL LOW (ref 3.5–5.1)
Sodium: 139 mmol/L (ref 135–145)

## 2023-08-04 LAB — GLUCOSE, CAPILLARY
Glucose-Capillary: 113 mg/dL — ABNORMAL HIGH (ref 70–99)
Glucose-Capillary: 212 mg/dL — ABNORMAL HIGH (ref 70–99)
Glucose-Capillary: 208 mg/dL — ABNORMAL HIGH (ref 70–99)

## 2023-08-04 LAB — CULTURE, BLOOD (ROUTINE X 2)
Culture: NO GROWTH
Culture: NO GROWTH

## 2023-08-04 LAB — HEMOGLOBIN AND HEMATOCRIT, BLOOD
HCT: 28.1 % — ABNORMAL LOW (ref 36.0–46.0)
Hemoglobin: 8.9 g/dL — ABNORMAL LOW (ref 12.0–15.0)

## 2023-08-04 MED ORDER — POTASSIUM CHLORIDE CRYS ER 20 MEQ PO TBCR
40.0000 meq | EXTENDED_RELEASE_TABLET | Freq: Once | ORAL | Status: AC
  Administered 2023-08-04: 40 meq via ORAL
  Filled 2023-08-04: qty 2

## 2023-08-04 NOTE — Plan of Care (Signed)
 Problem: Education: Goal: Ability to describe self-care measures that may prevent or decrease complications (Diabetes Survival Skills Education) will improve Outcome: Progressing Goal: Individualized Educational Video(s) Outcome: Progressing   Problem: Coping: Goal: Ability to adjust to condition or change in health will improve Outcome: Progressing   Problem: Fluid Volume: Goal: Ability to maintain a balanced intake and output will improve Outcome: Progressing   Problem: Health Behavior/Discharge Planning: Goal: Ability to identify and utilize available resources and services will improve Outcome: Progressing Goal: Ability to manage health-related needs will improve Outcome: Progressing   Problem: Metabolic: Goal: Ability to maintain appropriate glucose levels will improve Outcome: Progressing   Problem: Nutritional: Goal: Maintenance of adequate nutrition will improve Outcome: Progressing Goal: Progress toward achieving an optimal weight will improve Outcome: Progressing   Problem: Skin Integrity: Goal: Risk for impaired skin integrity will decrease Outcome: Progressing   Problem: Tissue Perfusion: Goal: Adequacy of tissue perfusion will improve Outcome: Progressing   Problem: Education: Goal: Knowledge of General Education information will improve Description: Including pain rating scale, medication(s)/side effects and non-pharmacologic comfort measures Outcome: Progressing   Problem: Health Behavior/Discharge Planning: Goal: Ability to manage health-related needs will improve Outcome: Progressing   Problem: Clinical Measurements: Goal: Ability to maintain clinical measurements within normal limits will improve Outcome: Progressing Goal: Will remain free from infection Outcome: Progressing Goal: Diagnostic test results will improve Outcome: Progressing Goal: Respiratory complications will improve Outcome: Progressing Goal: Cardiovascular complication will  be avoided Outcome: Progressing   Problem: Activity: Goal: Risk for activity intolerance will decrease Outcome: Progressing   Problem: Nutrition: Goal: Adequate nutrition will be maintained Outcome: Progressing   Problem: Coping: Goal: Level of anxiety will decrease Outcome: Progressing   Problem: Elimination: Goal: Will not experience complications related to bowel motility Outcome: Progressing Goal: Will not experience complications related to urinary retention Outcome: Progressing   Problem: Pain Management: Goal: General experience of comfort will improve Outcome: Progressing   Problem: Safety: Goal: Ability to remain free from injury will improve Outcome: Progressing   Problem: Skin Integrity: Goal: Risk for impaired skin integrity will decrease Outcome: Progressing   Problem: Education: Goal: Knowledge of disease or condition will improve Outcome: Progressing Goal: Knowledge of secondary prevention will improve (MUST DOCUMENT ALL) Outcome: Progressing Goal: Knowledge of patient specific risk factors will improve Loraine Leriche N/A or DELETE if not current risk factor) Outcome: Progressing   Problem: Ischemic Stroke/TIA Tissue Perfusion: Goal: Complications of ischemic stroke/TIA will be minimized Outcome: Progressing   Problem: Coping: Goal: Will verbalize positive feelings about self Outcome: Progressing Goal: Will identify appropriate support needs Outcome: Progressing   Problem: Health Behavior/Discharge Planning: Goal: Ability to manage health-related needs will improve Outcome: Progressing Goal: Goals will be collaboratively established with patient/family Outcome: Progressing   Problem: Self-Care: Goal: Ability to participate in self-care as condition permits will improve Outcome: Progressing Goal: Verbalization of feelings and concerns over difficulty with self-care will improve Outcome: Progressing Goal: Ability to communicate needs accurately will  improve Outcome: Progressing   Problem: Nutrition: Goal: Risk of aspiration will decrease Outcome: Progressing Goal: Dietary intake will improve Outcome: Progressing   Problem: Education: Goal: Ability to describe self-care measures that may prevent or decrease complications (Diabetes Survival Skills Education) will improve Outcome: Progressing Goal: Individualized Educational Video(s) Outcome: Progressing   Problem: Cardiac: Goal: Ability to maintain an adequate cardiac output will improve Outcome: Progressing   Problem: Health Behavior/Discharge Planning: Goal: Ability to identify and utilize available resources and services will improve Outcome: Progressing Goal: Ability to  manage health-related needs will improve Outcome: Progressing   Problem: Fluid Volume: Goal: Ability to achieve a balanced intake and output will improve Outcome: Progressing   Problem: Metabolic: Goal: Ability to maintain appropriate glucose levels will improve Outcome: Progressing

## 2023-08-04 NOTE — Progress Notes (Addendum)
HD#4 SUBJECTIVE:  Patient Summary: Maria Rose is a 67 YO F with no past medical history presenting to the ED with a three day history of headache and syncopal episode and admitted for stroke workup.  Overnight Events: None  Interim History: Patient was evaluated bedside. Husband is bedside. Patient feels like she is doing better. States she is less confused. Able to get up and go to the bathroom with minimal assistance. No new bleeding or concerns noted.   OBJECTIVE:  Vital Signs: Vitals:   08/03/23 2014 08/03/23 2343 08/04/23 0323 08/04/23 0836  BP: (!) 124/54 (!) 109/56 (!) 120/50 119/74  Pulse: 79 76 83   Resp: 16 15 17    Temp: 98.4 F (36.9 C) 98.8 F (37.1 C) 98.9 F (37.2 C) 98.2 F (36.8 C)  TempSrc: Oral Oral Oral Oral  SpO2: 95% 98% 94% 96%  Weight:      Height:       Supplemental O2: Room Air  Filed Weights   07/30/23 1132 08/01/23 0430  Weight: 81.6 kg 86.7 kg    Intake/Output Summary (Last 24 hours) at 08/04/2023 1150 Last data filed at 08/03/2023 2344 Gross per 24 hour  Intake 254.15 ml  Output 200 ml  Net 54.15 ml    Net IO Since Admission: 5,189.53 mL [08/04/23 1150]  CBC    Component Value Date/Time   WBC 7.7 08/04/2023 0305   RBC 2.96 (L) 08/04/2023 0305   HGB 7.5 (L) 08/04/2023 0305   HCT 23.1 (L) 08/04/2023 0305   PLT 99 (L) 08/04/2023 0305   MCV 78.0 (L) 08/04/2023 0305   MCH 25.3 (L) 08/04/2023 0305   MCHC 32.5 08/04/2023 0305   RDW 27.6 (H) 08/04/2023 0305   LYMPHSABS 3.9 07/31/2023 0222   MONOABS 0.9 07/31/2023 0222   EOSABS 0.0 07/31/2023 0222   BASOSABS 0.0 07/31/2023 0222   Physical Exam: Physical Exam Constitutional:      Appearance: Normal appearance.  HENT:     Head: Normocephalic and atraumatic.  Cardiovascular:     Rate and Rhythm: Normal rate and regular rhythm.     Pulses: Normal pulses.     Heart sounds: Normal heart sounds.  Pulmonary:     Effort: Pulmonary effort is normal.     Breath sounds: Normal  breath sounds.  Abdominal:     General: Bowel sounds are normal.  Neurological:     General: No focal deficit present.     Mental Status: She is alert and oriented to person, place, and time.    ASSESSMENT/PLAN:  Assessment: Principal Problem:   Sepsis (HCC) Active Problems:   Syncope and collapse   Hyperglycemia   SIRS (systemic inflammatory response syndrome) (HCC)   Hepatic cirrhosis (HCC)   GI bleed   Gastric varices   Splenic varices   Cerebrovascular accident (CVA) (HCC)   Hyperosmolar hyperglycemic state (HHS) (HCC)   Splenic infarct   Bleeding esophageal varices (HCC)  Plan: Stroke, likely embolic TEE previously deferred by cardiology given patient low hemoglobin, variceal bleed, and poor clinical status; will reconsider this procedure following her TIPS procedure tomorrow.  Blood cultures show no growth to date. Continues to be followed by cardiology and neurology peripherally. No acute concerns.  - SCDs for VTE ppx; no antithrombotic for now given anemia and thrombocytopenia per neurology - Neuro following, appreciate recs; will need outpatient follow up with stroke clinic at [facility] in 4 weeks.  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  Remains on IV PPI and ceftriaxone.  Hemoglobin at 7.5 this morning, stable.  Scheduled for elective TIPS by IR on Monday. Platelets 84 --> 99. No new bleeding.  - Continue ceftriaxone (last dose on 12/29) - Continue IV PPI, octreotide - Rechecking H/H this afternoon - Morning CBC - NPO at midnight for TIPS  T2DM Hemoglobin A1c 11.4. Remains well controlled inpatient with BG in the 120-190 range. Needs DM education and close PCP follow up. 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 Small lung nodule seen on imaging. Will need outpatient pulmonology follow up.   Best Practice: Diet: Cardiac diet VTE: SCDs Start: 07/30/23 1620 Code: Full AB: CTX Therapy Recs: CIR DISPO: Anticipated discharge  TBD  to  CIR  pending  complete stroke workup and TIPS .  Signature: Annett Fabian, MD Internal Medicine Resident, PGY-1 [facility]  Pager: [phone]  Please contact the on call pager after 5 pm and on weekends at [phone].

## 2023-08-04 NOTE — Progress Notes (Signed)
Mobility Specialist Progress Note   08/04/23 1415  Mobility  Activity Ambulated with assistance to bathroom;Ambulated with assistance in hallway  Level of Assistance Standby assist, set-up cues, supervision of patient - no hands on  Assistive Device Front wheel walker  Distance Ambulated (ft) 300 ft  Range of Motion/Exercises Active;All extremities  Activity Response Tolerated well   Patient received standing at the bedside requesting assistance to restroom. Agreeable to ambulate in hallway after. Ambulated supervision level with slow steady gait. Returned to room without complaint or incident. Was left in supine with all needs met, call bell in reach.   [place] Osvaldo Lamping, BS EXP Mobility Specialist Please contact via SecureChat or Rehab office at [phone]

## 2023-08-05 ENCOUNTER — Encounter (HOSPITAL_COMMUNITY)
Admission: EM | Disposition: A | Discharge status: Home / Self Care | Payer: Self-pay | Source: Home / Self Care | Attending: Internal Medicine

## 2023-08-05 ENCOUNTER — Inpatient Hospital Stay (HOSPITAL_COMMUNITY): Payer: Commercial Managed Care - PPO

## 2023-08-05 ENCOUNTER — Inpatient Hospital Stay (HOSPITAL_COMMUNITY): Payer: Commercial Managed Care - PPO | Admitting: Certified Registered Nurse Anesthetist

## 2023-08-05 ENCOUNTER — Encounter (HOSPITAL_COMMUNITY): Payer: Self-pay | Admitting: Infectious Diseases

## 2023-08-05 DIAGNOSIS — I864 Gastric varices: Secondary | ICD-10-CM

## 2023-08-05 HISTORY — PX: RADIOLOGY WITH ANESTHESIA: SHX6223

## 2023-08-05 HISTORY — PX: IR US GUIDE VASC ACCESS RIGHT: IMG2390

## 2023-08-05 HISTORY — PX: IR EMBO VENOUS NOT HEMORR HEMANG  INC GUIDE ROADMAPPING: IMG5447

## 2023-08-05 LAB — COMPREHENSIVE METABOLIC PANEL
ALT: 34 U/L (ref 0–44)
AST: 28 U/L (ref 15–41)
Albumin: 3 g/dL — ABNORMAL LOW (ref 3.5–5.0)
Alkaline Phosphatase: 50 U/L (ref 38–126)
Anion gap: 9 (ref 5–15)
BUN: 8 mg/dL (ref 8–23)
CO2: 25 mmol/L (ref 22–32)
Calcium: 8.3 mg/dL — ABNORMAL LOW (ref 8.9–10.3)
Chloride: 102 mmol/L (ref 98–111)
Creatinine, Ser: 0.63 mg/dL (ref 0.44–1.00)
GFR, Estimated: >60 mL/min (ref >60)
Glucose, Bld: 185 mg/dL — ABNORMAL HIGH (ref 70–99)
Potassium: 3.8 mmol/L (ref 3.5–5.1)
Sodium: 136 mmol/L (ref 135–145)
Total Bilirubin: 0.7 mg/dL (ref <1.2)
Total Protein: 5.6 g/dL — ABNORMAL LOW (ref 6.5–8.1)

## 2023-08-05 LAB — GLUCOSE, CAPILLARY
Glucose-Capillary: 159 mg/dL — ABNORMAL HIGH (ref 70–99)
Glucose-Capillary: 168 mg/dL — ABNORMAL HIGH (ref 70–99)
Glucose-Capillary: 187 mg/dL — ABNORMAL HIGH (ref 70–99)
Glucose-Capillary: 191 mg/dL — ABNORMAL HIGH (ref 70–99)
Glucose-Capillary: 205 mg/dL — ABNORMAL HIGH (ref 70–99)
Glucose-Capillary: 280 mg/dL — ABNORMAL HIGH (ref 70–99)
Glucose-Capillary: 305 mg/dL — ABNORMAL HIGH (ref 70–99)

## 2023-08-05 LAB — PROTIME-INR
INR: 1 (ref 0.8–1.2)
Prothrombin Time: 13.6 s (ref 11.4–15.2)

## 2023-08-05 LAB — CBC
HCT: 24.5 % — ABNORMAL LOW (ref 36.0–46.0)
Hemoglobin: 7.9 g/dL — ABNORMAL LOW (ref 12.0–15.0)
MCH: 25 pg — ABNORMAL LOW (ref 26.0–34.0)
MCHC: 32.2 g/dL (ref 30.0–36.0)
MCV: 77.5 fL — ABNORMAL LOW (ref 80.0–100.0)
Platelets: 105 10*3/uL — ABNORMAL LOW (ref 150–400)
RBC: 3.16 MIL/uL — ABNORMAL LOW (ref 3.87–5.11)
RDW: 27.6 % — ABNORMAL HIGH (ref 11.5–15.5)
WBC: 7.4 10*3/uL (ref 4.0–10.5)
nRBC: 0.3 % — ABNORMAL HIGH (ref 0.0–0.2)

## 2023-08-05 LAB — PREPARE RBC (CROSSMATCH)

## 2023-08-05 SURGERY — IR WITH ANESTHESIA
Anesthesia: General

## 2023-08-05 MED ORDER — FENTANYL CITRATE (PF) 100 MCG/2ML IJ SOLN: 25.0000 ug | INTRAMUSCULAR | Status: DC | PRN

## 2023-08-05 MED ORDER — FENTANYL CITRATE (PF) 250 MCG/5ML IJ SOLN
INTRAMUSCULAR | Status: AC
  Filled 2023-08-05: qty 5

## 2023-08-05 MED ORDER — OXYCODONE HCL 5 MG PO TABS: 5.0000 mg | ORAL_TABLET | Freq: Once | ORAL | Status: DC | PRN

## 2023-08-05 MED ORDER — CHLORHEXIDINE GLUCONATE 0.12 % MT SOLN
OROMUCOSAL | Status: AC
  Administered 2023-08-05: 15 mL via OROMUCOSAL
  Filled 2023-08-05: qty 15

## 2023-08-05 MED ORDER — VASOPRESSIN 20 UNIT/ML IV SOLN
INTRAVENOUS | Status: DC | PRN
  Administered 2023-08-05: 2 [IU] via INTRAVENOUS

## 2023-08-05 MED ORDER — PHENYLEPHRINE HCL-NACL 20-0.9 MG/250ML-% IV SOLN
INTRAVENOUS | Status: DC | PRN
  Administered 2023-08-05: 50 ug/min via INTRAVENOUS

## 2023-08-05 MED ORDER — INSULIN ASPART 100 UNIT/ML IJ SOLN
0.0000 [IU] | Freq: Three times a day (TID) | INTRAMUSCULAR | Status: DC
Start: 2023-08-05 — End: 2023-08-12
  Administered 2023-08-05: 8 [IU] via SUBCUTANEOUS
  Administered 2023-08-06 – 2023-08-07 (×4): 5 [IU] via SUBCUTANEOUS
  Administered 2023-08-07: 2 [IU] via SUBCUTANEOUS
  Administered 2023-08-07 – 2023-08-10 (×8): 3 [IU] via SUBCUTANEOUS
  Administered 2023-08-10: 5 [IU] via SUBCUTANEOUS
  Administered 2023-08-11 (×2): 3 [IU] via SUBCUTANEOUS
  Administered 2023-08-11: 5 [IU] via SUBCUTANEOUS
  Administered 2023-08-12: 8 [IU] via SUBCUTANEOUS
  Administered 2023-08-12: 3 [IU] via SUBCUTANEOUS

## 2023-08-05 MED ORDER — PROPOFOL 10 MG/ML IV BOLUS
INTRAVENOUS | Status: DC | PRN
  Administered 2023-08-05: 150 mg via INTRAVENOUS

## 2023-08-05 MED ORDER — CHLORHEXIDINE GLUCONATE 0.12 % MT SOLN: 15.0000 mL | Freq: Once | OROMUCOSAL | Status: AC

## 2023-08-05 MED ORDER — ONDANSETRON HCL 4 MG/2ML IJ SOLN
INTRAMUSCULAR | Status: DC | PRN
  Administered 2023-08-05: 4 mg via INTRAVENOUS

## 2023-08-05 MED ORDER — PHENYLEPHRINE 80 MCG/ML (10ML) SYRINGE FOR IV PUSH (FOR BLOOD PRESSURE SUPPORT)
PREFILLED_SYRINGE | INTRAVENOUS | Status: DC | PRN
  Administered 2023-08-05: 160 ug via INTRAVENOUS

## 2023-08-05 MED ORDER — SUCCINYLCHOLINE CHLORIDE 200 MG/10ML IV SOSY
PREFILLED_SYRINGE | INTRAVENOUS | Status: DC | PRN
  Administered 2023-08-05: 100 mg via INTRAVENOUS

## 2023-08-05 MED ORDER — SODIUM CHLORIDE 0.9 % IV SOLN
INTRAVENOUS | Status: AC
  Filled 2023-08-05: qty 20

## 2023-08-05 MED ORDER — OXYCODONE HCL 5 MG/5ML PO SOLN: 5.0000 mg | Freq: Once | ORAL | Status: DC | PRN

## 2023-08-05 MED ORDER — ORAL CARE MOUTH RINSE: 15.0000 mL | Freq: Once | OROMUCOSAL | Status: AC

## 2023-08-05 MED ORDER — SUGAMMADEX SODIUM 200 MG/2ML IV SOLN
INTRAVENOUS | Status: DC | PRN
  Administered 2023-08-05: 200 mg via INTRAVENOUS

## 2023-08-05 MED ORDER — DEXAMETHASONE SODIUM PHOSPHATE 10 MG/ML IJ SOLN
INTRAMUSCULAR | Status: DC | PRN
  Administered 2023-08-05: 10 mg via INTRAVENOUS

## 2023-08-05 MED ORDER — ALBUMIN HUMAN 5 % IV SOLN: INTRAVENOUS | Status: DC | PRN

## 2023-08-05 MED ORDER — IOHEXOL 300 MG/ML  SOLN
150.0000 mL | Freq: Once | INTRAMUSCULAR | Status: AC | PRN
  Administered 2023-08-05: 35 mL via INTRAVENOUS

## 2023-08-05 MED ORDER — INSULIN GLARGINE-YFGN 100 UNIT/ML ~~LOC~~ SOLN
12.0000 [IU] | Freq: Two times a day (BID) | SUBCUTANEOUS | Status: DC
  Administered 2023-08-05 – 2023-08-07 (×5): 12 [IU] via SUBCUTANEOUS
  Filled 2023-08-05 (×7): qty 0.12

## 2023-08-05 MED ORDER — SODIUM TETRADECYL SULFATE 1 % IV SOLN
INTRAVENOUS | Status: AC | PRN
  Administered 2023-08-05: 3 mL via INTRAVENOUS

## 2023-08-05 MED ORDER — INSULIN ASPART 100 UNIT/ML IJ SOLN
0.0000 [IU] | INTRAMUSCULAR | Status: DC | PRN
  Administered 2023-08-05: 4 [IU] via SUBCUTANEOUS

## 2023-08-05 MED ORDER — MIDAZOLAM HCL 2 MG/2ML IJ SOLN
INTRAMUSCULAR | Status: AC
  Filled 2023-08-05: qty 2

## 2023-08-05 MED ORDER — LACTATED RINGERS IV SOLN: INTRAVENOUS | Status: DC

## 2023-08-05 MED ORDER — PROPOFOL 10 MG/ML IV BOLUS
INTRAVENOUS | Status: AC
  Filled 2023-08-05: qty 20

## 2023-08-05 MED ORDER — DROPERIDOL 2.5 MG/ML IJ SOLN: 0.6250 mg | Freq: Once | INTRAMUSCULAR | Status: DC | PRN

## 2023-08-05 MED ORDER — SODIUM CHLORIDE 0.9% IV SOLUTION: Freq: Once | INTRAVENOUS | Status: DC

## 2023-08-05 MED ORDER — LIDOCAINE 2% (20 MG/ML) 5 ML SYRINGE
INTRAMUSCULAR | Status: DC | PRN
  Administered 2023-08-05: 50 mg via INTRAVENOUS

## 2023-08-05 MED ORDER — FENTANYL CITRATE (PF) 250 MCG/5ML IJ SOLN
INTRAMUSCULAR | Status: DC | PRN
  Administered 2023-08-05 (×2): 50 ug via INTRAVENOUS

## 2023-08-05 MED ORDER — ROCURONIUM BROMIDE 10 MG/ML (PF) SYRINGE
PREFILLED_SYRINGE | INTRAVENOUS | Status: DC | PRN
  Administered 2023-08-05: 50 mg via INTRAVENOUS

## 2023-08-05 NOTE — Anesthesia Preprocedure Evaluation (Signed)
Anesthesia Evaluation  Patient identified by MRN, date of birth, ID band Patient awake    Reviewed: Allergy & Precautions, NPO status , Patient's Chart, lab work & pertinent test results  History of Anesthesia Complications Negative for: history of anesthetic complications  Airway Mallampati: II  TM Distance: >3 FB Neck ROM: Full    Dental  (+) Edentulous Upper, Edentulous Lower   Pulmonary Current Smoker and Patient abstained from smoking.   Pulmonary exam normal breath sounds clear to auscultation       Cardiovascular negative cardio ROS Normal cardiovascular exam Rhythm:Regular Rate:Normal     Neuro/Psych CVA    GI/Hepatic ,,,(+) Cirrhosis   Esophageal Varices    hematemesis   Endo/Other  diabetes  BMI 29  Renal/GU negative Renal ROS     Musculoskeletal   Abdominal   Peds  Hematology  (+) Blood dyscrasia (Hb 9.1, plt 135k), anemia   Anesthesia Other Findings   Reproductive/Obstetrics                              Anesthesia Physical Anesthesia Plan  ASA: 4  Anesthesia Plan: General   Post-op Pain Management: Minimal or no pain anticipated   Induction: Intravenous and Rapid sequence  PONV Risk Score and Plan: 2 and Ondansetron, Dexamethasone and Treatment may vary due to age or medical condition  Airway Management Planned: Oral ETT  Additional Equipment: Arterial line and Ultrasound Guidance Line Placement  Intra-op Plan:   Post-operative Plan: Extubation in OR  Informed Consent: I have reviewed the patients History and Physical, chart, labs and discussed the procedure including the risks, benefits and alternatives for the proposed anesthesia with the patient or authorized representative who has indicated his/her understanding and acceptance.       Plan Discussed with: CRNA and Surgeon  Anesthesia Plan Comments:         Anesthesia Quick Evaluation

## 2023-08-05 NOTE — Progress Notes (Signed)
Pt back from OR. Right groin level 0.   08/05/23 1448  Vitals  Temp 98.5 F (36.9 C)  Temp Source Oral  BP 135/67  MAP (mmHg) 88  BP Location Left Arm  BP Method Automatic  Patient Position (if appropriate) Lying  Pulse Rate 80  Pulse Rate Source Monitor  ECG Heart Rate 78  Resp 20  Level of Consciousness  Level of Consciousness Alert  Oxygen Therapy  SpO2 98 %  O2 Device Nasal Cannula  O2 Flow Rate (L/min) 2 L/min  Pain Assessment  Pain Scale 0-10  Pain Score 0  MEWS Score  MEWS Temp 0  MEWS Systolic 0  MEWS Pulse 0  MEWS RR 0  MEWS LOC 0  MEWS Score 0  MEWS Score Color [place] Si

## 2023-08-05 NOTE — H&P (Signed)
Chief Complaint: Bleeding varices   Referring Provider(s): Audie Box   Supervising Physician: Malachy Moan  Patient Status: [facility] - In-pt  History of Present Illness: Maria Rose is a 67 y.o. female Maria Rose is a 67 y.o. female who presented to the ED on 07/30/23 after a syncopal episode.   Hgb 10.3, platelets 188   AST 29, ALT 24, alk phos 59, total bilirubin 0.79 INR 1.4   CT chest abdomen pelvis with contrast showed cirrhosis and splenic and gastric varices.   GI was consulted when patient began to have melena and then hematemesis with drop in hemoglobin from 10.3-5.3.   Endoscopy showed bleeding varices.   IR is asked to evaluate for TIPS.   She has been transfused and is currently stable without signs of re-bleeding.   Currently Na MELD = 10   ECHO showed normal RV and LV function.   CTA reviewed by Dr. Bryn Gulling.   Anatomy amenable to TIPS. She currently has no complaints and her condition is stable for procedure today.   Patient is Full Code   Past Medical History Fatty Liver Diabetes Obesity  Past Surgical History:  Procedure Laterality Date   ESOPHAGOGASTRODUODENOSCOPY (EGD) WITH PROPOFOL N/A 07/31/2023   Procedure: ESOPHAGOGASTRODUODENOSCOPY (EGD) WITH PROPOFOL;  Surgeon: Jenel Lucks, MD;  Location: [facility] ENDOSCOPY;  Service: Gastroenterology;  Laterality: N/A;   HEMOSTASIS CONTROL  07/31/2023   Procedure: HEMOSTASIS CONTROL;  Surgeon: Jenel Lucks, MD;  Location: [facility] ENDOSCOPY;  Service: Gastroenterology;;    Allergies: Patient has no known allergies.  Medications: Prior to Admission medications   Medication Sig Start Date End Date Taking? Authorizing Provider  clotrimazole (LOTRIMIN) 1 % cream Apply to affected area 2 times daily 05/16/23  Yes Renne Crigler, PA-C  erythromycin ophthalmic ointment Place 1 Application into the left eye 4 (four) times daily. 06/03/23  Yes [provider]  mupirocin ointment  (BACTROBAN) 2 % Apply 1 Application topically 2 (two) times daily. 06/26/23  Yes [provider]      Social History   Socioeconomic History   Marital status: Single    Spouse name: Not on file   Number of children: Not on file   Years of education: Not on file   Highest education level: Not on file  Occupational History   Not on file  Tobacco Use   Smoking status: Every Day   Smokeless tobacco: Never  Substance and Sexual Activity   Alcohol use: Yes   Drug use: Not on file   Sexual activity: Not on file  Other Topics Concern   Not on file  Social History Narrative   Not on file   Social Drivers of Health   Financial Resource Strain: Not on file  Food Insecurity: No Food Insecurity (07/30/2023)   Hunger Vital Sign    Worried About Running Out of Food in the Last Year: Never true    Ran Out of Food in the Last Year: Never true  Transportation Needs: No Transportation Needs (07/30/2023)   PRAPARE - Administrator, Civil Service (Medical): No    Lack of Transportation (Non-Medical): No  Physical Activity: Not on file  Stress: Not on file  Social Connections: Unknown (05/14/2023)   Received from [facility]   Social Network    Social Network: Not on file     Review of Systems: A 12 point ROS discussed and pertinent positives are indicated in the HPI above.  All other  systems are negative.    Vital Signs: BP (!) 119/52 (BP Location: Left Arm)   Pulse 77   Temp 97.9 F (36.6 C) (Oral)   Resp 20   Ht 5\' 6"  (1.676 m)   Wt 191 lb 2.2 oz (86.7 kg)   SpO2 96%   BMI 30.85 kg/m   Advance Care Plan: The advanced care place/surrogate decision maker was discussed at the time of visit and the patient did not wish to discuss or was not able to name a surrogate decision maker or provide an advance care plan.  Physical Exam Vitals reviewed.  Constitutional:      Appearance: Normal appearance.  HENT:     Head: Normocephalic and atraumatic.  Eyes:      Extraocular Movements: Extraocular movements intact.  Cardiovascular:     Rate and Rhythm: Normal rate and regular rhythm.  Pulmonary:     Effort: Pulmonary effort is normal. No respiratory distress.     Breath sounds: Normal breath sounds.  Abdominal:     Palpations: Abdomen is soft.     Tenderness: There is no abdominal tenderness.  Musculoskeletal:        General: Normal range of motion.     Cervical back: Normal range of motion.  Skin:    General: Skin is warm and dry.  Neurological:     General: No focal deficit present.     Mental Status: She is alert and oriented to person, place, and time.  Psychiatric:        Mood and Affect: Mood normal.        Behavior: Behavior normal.        Thought Content: Thought content normal.        Judgment: Judgment normal.     Labs:  CBC: Recent Labs    08/02/23 0303 08/03/23 0337 08/04/23 0305 08/04/23 1553 08/05/23 0420  WBC 8.3 6.7 7.7  --  7.4  HGB 7.9* 7.4* 7.5* 8.9* 7.9*  HCT 23.5* 23.1* 23.1* 28.1* 24.5*  PLT 87* 84* 99*  --  105*    COAGS: Recent Labs    07/31/23 0222 08/05/23 0420  INR 1.4* 1.0    BMP: Recent Labs    07/31/23 2140 08/01/23 0315 08/04/23 0305 08/05/23 0420  NA 140 137 139 136  K 3.5 3.4* 3.4* 3.8  CL 111 108 104 102  CO2 23 23 25 25   GLUCOSE 145* 104* 101* 185*  BUN 23 24* 7* 8  CALCIUM 7.6* 7.4* 8.2* 8.3*  CREATININE 0.62 0.59 0.52 0.63  GFRNONAA >60 >60 >60 >60    LIVER FUNCTION TESTS: Recent Labs    05/16/23 1246 07/30/23 1219 07/31/23 0222 08/05/23 0420  BILITOT 0.5 0.7 0.6 0.7  AST 25 29 43* 28  ALT 21 24 34 34  ALKPHOS 78 59 35* 50  PROT 7.3 6.0* 3.9* 5.6*  ALBUMIN 3.8 3.2* 2.2* 3.0*    TUMOR MARKERS: No results for input(s): "AFPTM", "CEA", "CA199", "CHROMGRNA" in the last 8760 hours.  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    She is currently stable without signs of re-bleeding.  Vitals  signs and labs are within acceptable limits.   Currently Na MELD = 6  Patient is ready to proceed with BRTO (possible TIPS) today by Dr. Archer Asa under general anesthesia.     Risks and benefits of TIPS, BRTO and/or additional variceal embolization were discussed with the patient and/or the  patient's family including, but not limited to, infection, bleeding, damage to adjacent structures, worsening hepatic and/or cardiac function, worsening and/or the development of altered mental status/encephalopathy, non-target embolization and death.    All of the patient's questions were answered, patient is agreeable to proceed.   Consent signed and in IR Consent Box   Electronically Signed: Gwynneth Macleod, PA-C   08/05/2023, 8:02 AM      I spent a total of    15 Minutes in face to face in clinical consultation, greater than 50% of which was counseling/coordinating care for pre-procedure H&P for TIPS/BRTO.

## 2023-08-05 NOTE — Anesthesia Procedure Notes (Signed)
Arterial Line Insertion Start/End12/29/2024 11:00 AM, 08/05/2023 11:00 PM Performed by: [place] Nation, MD, anesthesiologist  Patient location: Pre-op. Preanesthetic checklist: patient identified, IV checked, site marked, risks and benefits discussed, surgical consent, monitors and equipment checked, pre-op evaluation, timeout performed and anesthesia consent Lidocaine 1% used for infiltration radial was placed Catheter size: 20 G Hand hygiene performed  and maximum sterile barriers used   Attempts: 1 Procedure performed using ultrasound guided technique. Following insertion, dressing applied. Post procedure assessment: normal and unchanged

## 2023-08-05 NOTE — Anesthesia Postprocedure Evaluation (Signed)
Anesthesia Post Note  Patient: Maria Rose  Procedure(s) Performed: ESOPHAGOGASTRODUODENOSCOPY (EGD) WITH PROPOFOL HEMOSTASIS CONTROL     Patient location during evaluation: Endoscopy Anesthesia Type: General Level of consciousness: awake and alert Pain management: pain level controlled Vital Signs Assessment: post-procedure vital signs reviewed and stable Respiratory status: spontaneous breathing, nonlabored ventilation and respiratory function stable Cardiovascular status: stable and blood pressure returned to baseline Postop Assessment: no apparent nausea or vomiting Anesthetic complications: no   No notable events documented.         Keola Heninger

## 2023-08-05 NOTE — Anesthesia Procedure Notes (Signed)
Arterial Line Insertion Start/End12/29/2024 10:40 AM, 08/05/2023 10:45 AM Performed by: Elmo Nation, MD, anesthesiologist  Patient location: Pre-op. Preanesthetic checklist: patient identified, IV checked, site marked, risks and benefits discussed, surgical consent, monitors and equipment checked, pre-op evaluation, timeout performed and anesthesia consent Lidocaine 1% used for infiltration Right, radial was placed Catheter size: 20 G Hand hygiene performed  and maximum sterile barriers used   Attempts: 1 Procedure performed using ultrasound guided technique. Ultrasound Notes:anatomy identified, needle tip was noted to be adjacent to the nerve/plexus identified and no ultrasound evidence of intravascular and/or intraneural injection Following insertion, dressing applied and Biopatch. Post procedure assessment: normal and unchanged  Patient tolerated the procedure well with no immediate complications.

## 2023-08-05 NOTE — Anesthesia Procedure Notes (Signed)
Procedure Name: Intubation Date/Time: 08/05/2023 11:30 AM  Performed by: Gloris Ham, CRNAPre-anesthesia Checklist: Patient identified, Emergency Drugs available, Suction available and Patient being monitored Patient Re-evaluated:Patient Re-evaluated prior to induction Oxygen Delivery Method: Circle System Utilized Preoxygenation: Pre-oxygenation with 100% oxygen Induction Type: IV induction Ventilation: Mask ventilation without difficulty Laryngoscope Size: Mac and 3 Grade View: Grade II Tube type: Oral Tube size: 7.0 mm Number of attempts: 1 Airway Equipment and Method: Stylet and Oral airway Placement Confirmation: ETT inserted through vocal cords under direct vision, positive ETCO2 and breath sounds checked- equal and bilateral Tube secured with: Tape Dental Injury: Teeth and Oropharynx as per pre-operative assessment

## 2023-08-05 NOTE — Progress Notes (Signed)
HD#5 SUBJECTIVE:  Patient Summary: Maria Rose is a 67 YO F with no past medical history presenting to the ED with a three day history of headache and syncopal episode and admitted for stroke workup.  Overnight Events: None  Interim History: Patient was evaluated bedside in the PACU. She was somnolent due to anesthesia from her BRTO procedure, but vitals are stable. Went and spoke with her husband who was bedside on the unit. He said she was doing well this morning before the procedure.   OBJECTIVE:  Vital Signs: Vitals:   08/05/23 0750 08/05/23 0955 08/05/23 1018 08/05/23 1330  BP: (!) 130/58  (!) 115/54 (!) 111/57  Pulse:   75 73  Resp:   15 12  Temp: 97.9 F (36.6 C)  99 F (37.2 C) 97.8 F (36.6 C)  TempSrc: Oral  Oral   SpO2:   94% 98%  Weight:  86.7 kg    Height:  5' 5.98" (1.676 m)     Supplemental O2: Room Air  Filed Weights   07/30/23 1132 08/01/23 0430 08/05/23 0955  Weight: 81.6 kg 86.7 kg 86.7 kg    Intake/Output Summary (Last 24 hours) at 08/05/2023 1341 Last data filed at 08/05/2023 1316 Gross per 24 hour  Intake 3095.68 ml  Output 410 ml  Net 2685.68 ml   Net IO Since Admission: 7,875.21 mL [08/05/23 1341]  CBC    Component Value Date/Time   WBC 7.4 08/05/2023 0420   RBC 3.16 (L) 08/05/2023 0420   HGB 7.9 (L) 08/05/2023 0420   HCT 24.5 (L) 08/05/2023 0420   PLT 105 (L) 08/05/2023 0420   MCV 77.5 (L) 08/05/2023 0420   MCH 25.0 (L) 08/05/2023 0420   MCHC 32.2 08/05/2023 0420   RDW 27.6 (H) 08/05/2023 0420   LYMPHSABS 3.9 07/31/2023 0222   MONOABS 0.9 07/31/2023 0222   EOSABS 0.0 07/31/2023 0222   BASOSABS 0.0 07/31/2023 0222   Physical Exam: Physical Exam Constitutional:      Appearance: Normal appearance.     Comments: Somnolent 2/2 anesthesia  HENT:     Head: Normocephalic and atraumatic.  Cardiovascular:     Rate and Rhythm: Normal rate and regular rhythm.     Pulses: Normal pulses.     Heart sounds: Normal heart sounds.   Pulmonary:     Effort: Pulmonary effort is normal.     Breath sounds: Normal breath sounds.  Musculoskeletal:        General: Swelling present.     Comments: Small hematoma on left wrist; access site on right groin, clean and dry  Neurological:     Comments: Unable to assess given somnolence    ASSESSMENT/PLAN:  Assessment: Principal Problem:   Sepsis (HCC) Active Problems:   Syncope and collapse   Hyperglycemia   SIRS (systemic inflammatory response syndrome) (HCC)   Decompensated cirrhosis (HCC)   GI bleed   Gastric varices   Splenic varices   Cerebrovascular accident (CVA) (HCC)   Hyperosmolar hyperglycemic state (HHS) (HCC)   Splenic infarct   Bleeding esophageal varices (HCC)  Plan: Stroke, likely embolic No acute concerns or new neurologic deficits. Needs a TEE for concerns of identifying an embolic origin of scattered infarcts. This was previously deferred by cardiology given her poor clinical status; will reconsider this procedure following her BRTO/TIPS procedure.  Blood cultures show no growth to date. Continues to be followed by cardiology and neurology peripherally.  - SCDs for VTE ppx; no antithrombotic for now given anemia and  thrombocytopenia per neurology - Neuro following, appreciate recs; will need outpatient follow up with stroke clinic at [facility] in 4 weeks.  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  Remains on IV PPI and ceftriaxone.  Hemoglobin at 7.9 this morning, stable. Platelets 84 --> 99 --> 105. No new bleeding. Had a BRTO procedure today with IR which went well. Access site looks good. Received 1 unit pRBCs.  - Bed rest today and overnight - CT abdomen w and wo contrast in 48 hours - Resume diet - Continue ceftriaxone,  last dose today - Continue IV PPI, octreotide  T2DM Hemoglobin A1c 11.4. Remains well controlled inpatient with BG in the 120-200 range. Needs DM education and close PCP follow up. - Held morning insulin dose; restart this evening once eating - Continue SSI and 12 units Semglee BID - Trend CBG; frequency changed to [facility] + HS  Chronic interstitial lung disease, probable Usual interstitial pneumonia Unclear etiology. Small lung nodule seen on imaging. Will need outpatient pulmonology follow up.   Best Practice: Diet: Cardiac diet VTE: SCDs Start: 07/30/23 1620 Code: Full AB: CTX Therapy Recs: CIR DISPO: Anticipated discharge  TBD  to  CIR  pending  complete stroke workup and TIPS .  Signature: Annett Fabian, MD Internal Medicine Resident, PGY-1 [facility]  Pager: [phone]  Please contact the on call pager after 5 pm and on weekends at [phone].

## 2023-08-05 NOTE — Anesthesia Postprocedure Evaluation (Signed)
Anesthesia Post Note  Patient: Maria Rose  Procedure(s) Performed: IR WITH ANESTHESIA -TIPS     Patient location during evaluation: PACU Anesthesia Type: General Level of consciousness: awake and alert Pain management: pain level controlled Vital Signs Assessment: post-procedure vital signs reviewed and stable Respiratory status: spontaneous breathing, nonlabored ventilation, respiratory function stable and patient connected to nasal cannula oxygen Cardiovascular status: blood pressure returned to baseline and stable Postop Assessment: no apparent nausea or vomiting Anesthetic complications: no   No notable events documented.  Last Vitals:  Vitals:   08/05/23 1018 08/05/23 1330  BP: (!) 115/54 (!) 111/57  Pulse: 75 73  Resp: 15 12  Temp: 37.2 C 36.6 C  SpO2: 94% 98%    Last Pain:  Vitals:   08/05/23 1018  TempSrc: Oral  PainSc:                  Ponshewaing Nation

## 2023-08-05 NOTE — Procedures (Signed)
Interventional Radiology Procedure Note  Procedure: Successful coil-assisted BRTO of gastric varices.   Complications: None  Estimated Blood Loss: None  Recommendations: - Bedrest today and overnight - Resume diet - CT ABD WO and W (BRTO PROTOCOL) in 48 hrs   Signed,  Sterling Big, MD

## 2023-08-05 NOTE — Sedation Documentation (Signed)
Pt sedated and under the care of anesthesia

## 2023-08-05 NOTE — Transfer of Care (Signed)
Immediate Anesthesia Transfer of Care Note  Patient: DENECIA GOLEMAN  Procedure(s) Performed: IR WITH ANESTHESIA -TIPS  Patient Location: PACU  Anesthesia Type:General  Level of Consciousness: drowsy and responds to stimulation  Airway & Oxygen Therapy: Patient Spontanous Breathing and Patient connected to face mask oxygen  Post-op Assessment: Report given to RN and Post -op Vital signs reviewed and stable  Post vital signs: Reviewed and stable  Last Vitals:  Vitals Value Taken Time  BP    Temp    Pulse 83 08/05/23 1346  Resp 16 08/05/23 1346  SpO2 86 % 08/05/23 1346  Vitals shown include unfiled device data.  Last Pain:  Vitals:   08/05/23 1018  TempSrc: Oral  PainSc:          Complications: There were no known notable events for this encounter.

## 2023-08-05 NOTE — Anesthesia Procedure Notes (Signed)
Central Venous Catheter Insertion Performed by:  Nation, MD, anesthesiologist Start/End12/29/2024 10:50 AM, 08/05/2023 11:00 AM Patient location: Pre-op. Preanesthetic checklist: patient identified, IV checked, site marked, risks and benefits discussed, surgical consent, monitors and equipment checked, pre-op evaluation, timeout performed and anesthesia consent Lidocaine 1% used for infiltration and patient sedated Hand hygiene performed  and maximum sterile barriers used  Catheter size: 8 Fr Total catheter length 16. Central line was placed.Double lumen Procedure performed using ultrasound guided technique. Ultrasound Notes:image(s) printed for medical record Attempts: 1 Following insertion, dressing applied and line sutured. Post procedure assessment: blood return through all ports  Patient tolerated the procedure well with no immediate complications.

## 2023-08-06 ENCOUNTER — Encounter (HOSPITAL_COMMUNITY): Payer: Self-pay | Admitting: Radiology

## 2023-08-06 DIAGNOSIS — R6520 Severe sepsis without septic shock: Secondary | ICD-10-CM | POA: Diagnosis not present

## 2023-08-06 DIAGNOSIS — A419 Sepsis, unspecified organism: Secondary | ICD-10-CM | POA: Diagnosis not present

## 2023-08-06 LAB — CBC
HCT: 28.3 % — ABNORMAL LOW (ref 36.0–46.0)
Hemoglobin: 9.3 g/dL — ABNORMAL LOW (ref 12.0–15.0)
MCH: 25.3 pg — ABNORMAL LOW (ref 26.0–34.0)
MCHC: 32.9 g/dL (ref 30.0–36.0)
MCV: 76.9 fL — ABNORMAL LOW (ref 80.0–100.0)
Platelets: 116 10*3/uL — ABNORMAL LOW (ref 150–400)
RBC: 3.68 MIL/uL — ABNORMAL LOW (ref 3.87–5.11)
RDW: 25 % — ABNORMAL HIGH (ref 11.5–15.5)
WBC: 10.3 10*3/uL (ref 4.0–10.5)
nRBC: 0 % (ref 0.0–0.2)

## 2023-08-06 LAB — COMPREHENSIVE METABOLIC PANEL
ALT: 33 U/L (ref 0–44)
AST: 30 U/L (ref 15–41)
Albumin: 3.4 g/dL — ABNORMAL LOW (ref 3.5–5.0)
Alkaline Phosphatase: 52 U/L (ref 38–126)
Anion gap: 11 (ref 5–15)
BUN: 11 mg/dL (ref 8–23)
CO2: 23 mmol/L (ref 22–32)
Calcium: 8.6 mg/dL — ABNORMAL LOW (ref 8.9–10.3)
Chloride: 102 mmol/L (ref 98–111)
Creatinine, Ser: 0.69 mg/dL (ref 0.44–1.00)
GFR, Estimated: >60 mL/min (ref >60)
Glucose, Bld: 251 mg/dL — ABNORMAL HIGH (ref 70–99)
Potassium: 3.7 mmol/L (ref 3.5–5.1)
Sodium: 136 mmol/L (ref 135–145)
Total Bilirubin: 0.8 mg/dL (ref <1.2)
Total Protein: 6.2 g/dL — ABNORMAL LOW (ref 6.5–8.1)

## 2023-08-06 LAB — GLUCOSE, CAPILLARY
Glucose-Capillary: 211 mg/dL — ABNORMAL HIGH (ref 70–99)
Glucose-Capillary: 229 mg/dL — ABNORMAL HIGH (ref 70–99)
Glucose-Capillary: 217 mg/dL — ABNORMAL HIGH (ref 70–99)
Glucose-Capillary: 195 mg/dL — ABNORMAL HIGH (ref 70–99)

## 2023-08-06 NOTE — Progress Notes (Addendum)
HD#6 SUBJECTIVE:  Patient Summary: Maria Rose is a 67 YO F with no past medical history presenting to the ED with a three day history of headache and syncopal episode and admitted for stroke workup.  Overnight Events: None  Interim History: Patient was evaluated bedside. States she feels great and the procedure went well. Has been using the bathroom with no problems. No bowel movements yet. No new concerns.   OBJECTIVE:  Vital Signs: Vitals:   08/06/23 0343 08/06/23 0352 08/06/23 0820 08/06/23 1136  BP: 130/65  126/69 (!) 141/74  Pulse:  80 81 71  Resp: 18  18 20   Temp: 97.8 F (36.6 C)  98.3 F (36.8 C) 97.6 F (36.4 C)  TempSrc: Oral  Oral Oral  SpO2:  96%    Weight:      Height:       Supplemental O2: Room Air  Filed Weights   07/30/23 1132 08/01/23 0430 08/05/23 0955  Weight: 81.6 kg 86.7 kg 86.7 kg    Intake/Output Summary (Last 24 hours) at 08/06/2023 1202 Last data filed at 08/06/2023 0345 Gross per 24 hour  Intake 1674.38 ml  Output 410 ml  Net 1264.38 ml   Net IO Since Admission: 8,349.59 mL [08/06/23 1202]  CBC    Component Value Date/Time   WBC 10.3 08/06/2023 0450   RBC 3.68 (L) 08/06/2023 0450   HGB 9.3 (L) 08/06/2023 0450   HCT 28.3 (L) 08/06/2023 0450   PLT 116 (L) 08/06/2023 0450   MCV 76.9 (L) 08/06/2023 0450   MCH 25.3 (L) 08/06/2023 0450   MCHC 32.9 08/06/2023 0450   RDW 25.0 (H) 08/06/2023 0450   LYMPHSABS 3.9 07/31/2023 0222   MONOABS 0.9 07/31/2023 0222   EOSABS 0.0 07/31/2023 0222   BASOSABS 0.0 07/31/2023 0222   Physical Exam: Physical Exam Constitutional:      Appearance: Normal appearance.  HENT:     Head: Normocephalic and atraumatic.  Cardiovascular:     Rate and Rhythm: Normal rate and regular rhythm.     Pulses: Normal pulses.     Heart sounds: Normal heart sounds.  Pulmonary:     Effort: Pulmonary effort is normal.     Breath sounds: Normal breath sounds.  Abdominal:     General: Abdomen is flat.      Palpations: Abdomen is soft.     Tenderness: There is no abdominal tenderness.  Musculoskeletal:     Comments: access site on right groin clean and dry; no erythema or tenderness  Neurological:     Mental Status: She is alert and oriented to person, place, and time.    ASSESSMENT/PLAN:  Assessment: Principal Problem:   Sepsis (HCC) Active Problems:   Syncope and collapse   Hyperglycemia   SIRS (systemic inflammatory response syndrome) (HCC)   Decompensated cirrhosis (HCC)   GI bleed   Gastric varices   Splenic varices   Cerebrovascular accident (CVA) (HCC)   Hyperosmolar hyperglycemic state (HHS) (HCC)   Splenic infarct   Bleeding esophageal varices (HCC)  Maria Rose is a 67 YO F with no past medical history presenting to the ED with a three day history of headache and syncopal episode and admitted for stroke workup and new onset decompensated cirrhosis.  Plan: Stroke, likely embolic No acute concerns or new neurologic deficits. We will touch base with cardiology about possibly doing a TEE this week now that her varices have been addressed.  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 Hemoglobin at 9.3 this morning, stable after 1 unit pRBC yesterday. Platelets  99 --> 105 --> 116. VSS, no new bleeding, no abdominal pain. Access site looks normal. Bed rest for now and CT A/P tomorrow per IR.  - Bed rest today - CT abdomen w and wo contrast (BRTO protocol) tomorrow - Discontinued octreotide, ceftriaxone - Continue CM diet  T2DM Hemoglobin A1c 11.4. BG elevated in the 200-300 range. May  be related to missed doses given recent procedure. Will monitor today and titrate insulin tomorrow as needed. She will benefit from DM education and close PCP follow up. - Carb modified diet - Continue SSI and 12 units Semglee BID - Trend CBG  Chronic interstitial lung disease, probable Usual interstitial pneumonia Unclear etiology. Small lung nodule seen on imaging. Will need outpatient pulmonology follow up.   Best Practice: Diet: CM diet VTE: SCDs Start: 07/30/23 1620 Code: Full AB: None Therapy Recs: CIR DISPO: Anticipated discharge  TBD  to  CIR  pending  TEE and CIR placement .  Signature: Annett Fabian, MD Internal Medicine Resident, PGY-1 [facility]  Pager: [phone]  Please contact the on call pager after 5 pm and on weekends at [phone].

## 2023-08-06 NOTE — Progress Notes (Signed)
Inpatient Rehab Admissions Coordinator:   Continuing to follow, patient is not ready for discharge yet.   Rehab Admissons Coordinator [place], [place], [place] [phone]

## 2023-08-06 NOTE — Progress Notes (Signed)
PT Cancellation Note  Patient Details Name: Maria Rose MRN: [id number] DOB: 1955/09/15   Cancelled Treatment:    Reason Eval/Treat Not Completed: Active bedrest order. Bedrest x 24 hours ordered 08/05/23 1347   Ilda Foil 08/06/2023, 8:32 AM

## 2023-08-06 NOTE — Plan of Care (Signed)
  Problem: Education: Goal: Ability to describe self-care measures that may prevent or decrease complications (Diabetes Survival Skills Education) will improve Outcome: Progressing Goal: Individualized Educational Video(s) Outcome: Progressing   

## 2023-08-06 NOTE — Progress Notes (Signed)
Chief Complaint: Patient was seen today for s/p BRTO of gastric varices   Supervising Physician: Irish Lack  Patient Status: [facility] - In-pt  Subjective: S/p BRTO gastric varices yesterday. Pt doing very well this am. Denies any hematemesis or blood in stool. Tolerating regular diet.   Objective: Physical Exam: BP 126/69 (BP Location: Left Arm)   Pulse 81   Temp 98.3 F (36.8 C) (Oral)   Resp 18   Ht 5' 5.98" (1.676 m)   Wt 191 lb 2.2 oz (86.7 kg)   SpO2 96%   BMI 30.87 kg/m  Abd: soft, ND, NT (R)groin puncture site soft, NT, no hematoma   Current Facility-Administered Medications:    0.9 %  sodium chloride infusion (Manually program via Guardrails IV Fluids), , Intravenous, Once, Steffanie Dunn, Amanda R, PA-C, Stopped at 08/01/23 0000   0.9 %  sodium chloride infusion (Manually program via Guardrails IV Fluids), , Intravenous, Once, Wales Nation, MD   acetaminophen (TYLENOL) tablet 650 mg, 650 mg, Oral, Q6H PRN, Icard, Bradley L, DO, 650 mg at 08/02/23 0810   Chlorhexidine Gluconate Cloth 2 % PADS 6 each, 6 each, Topical, Daily, Desai, Rahul P, PA-C, 6 each at 08/06/23 0848   dextrose 50 % solution 0-50 mL, 0-50 mL, Intravenous, PRN, Desai, Rahul P, PA-C   insulin aspart (novoLOG) injection 0-15 Units, 0-15 Units, Subcutaneous, TID WC, Annett Fabian, MD, 5 Units at 08/06/23 0640   insulin glargine-yfgn [facility]) injection 12 Units, 12 Units, Subcutaneous, BID, Annett Fabian, MD, 12 Units at 08/06/23 0848   mupirocin ointment (BACTROBAN) 2 %, , Nasal, BID, Icard, Bradley L, DO, Given at 08/06/23 0848   Muscle Rub CREA, , Topical, QID PRN, Ginnie Smart, MD, Given at 08/02/23 2154   ondansetron [facility]) injection 4 mg, 4 mg, Intravenous, Q6H PRN, Celine Mans, Rahul P, PA-C, 4 mg at 07/31/23 0425   Oral care mouth rinse, 15 mL, Mouth Rinse, PRN, Desai, Rahul P, PA-C   [EXPIRED] pantoprazole (PROTONIX) injection 40 mg, 40 mg, Intravenous, Q6H, 40 mg at 08/03/23 0352  **FOLLOWED BY** pantoprazole (PROTONIX) injection 40 mg, 40 mg, Intravenous, Q12H, Desai, Rahul P, PA-C, 40 mg at 08/06/23 0848   promethazine (PHENERGAN) 12.5 mg in sodium chloride 0.9 % 50 mL IVPB, 12.5 mg, Intravenous, Q6H PRN, Patrici Ranks, MD, Paused at 07/31/23 0856   sodium chloride 0.9 % bolus 1,000 mL, 1,000 mL, Intravenous, Once PRN, Ginnie Smart, MD  Labs: CBC Recent Labs    08/05/23 0420 08/06/23 0450  WBC 7.4 10.3  HGB 7.9* 9.3*  HCT 24.5* 28.3*  PLT 105* 116*   BMET Recent Labs    08/05/23 0420 08/06/23 0450  NA 136 136  K 3.8 3.7  CL 102 102  CO2 25 23  GLUCOSE 185* 251*  BUN 8 11  CREATININE 0.63 0.69  CALCIUM 8.3* 8.6*   LFT Recent Labs    08/06/23 0450  PROT 6.2*  ALBUMIN 3.4*  AST 30  ALT 33  ALKPHOS 52  BILITOT 0.8   PT/INR Recent Labs    08/05/23 0420  LABPROT 13.6  INR 1.0     Studies/Results: IR EMBO VENOUS NOT HEMORR HEMANG  INC GUIDE ROADMAPPING Result Date: 08/05/2023 CLINICAL DATA:  67 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 EXAM: 1. Ultrasound-guided puncture right common femoral vein 2. Catheterization of the left renal vein with venogram 3. Catheterization of the common origin of the gastro  renal shunt and left adrenal vein with venogram 4. Catheterization of the gastro renal shunt with venogram 5. Catheterization of inferior phrenic vein with venogram 6. Coil embolization of left inferior phrenic vein 7. Combined coil and foam embolization of gastric varices. MEDICATIONS: As antibiotic prophylaxis, 2 g Rocephin was ordered pre-procedure and administered intravenously within one hour of incision. ANESTHESIA/SEDATION: General - as administered by the Anesthesia department CONTRAST:  35 mL Omnipaque 300 FLUOROSCOPY: Radiation Exposure Index (as provided by the fluoroscopic device): 427 mGy Kerma COMPLICATIONS: None immediate.  PROCEDURE: Informed written consent was obtained from the patient after a thorough discussion of the procedural risks, benefits and alternatives. All questions were addressed. Maximal Sterile Barrier Technique was utilized including caps, mask, sterile gowns, sterile gloves, sterile drape, hand hygiene and skin antiseptic. A timeout was performed prior to the initiation of the procedure. The right common femoral vein was interrogated with ultrasound and found to be widely patent. An image was obtained and stored for the medical record. Local anesthesia was attained by infiltration with 1% lidocaine. A small dermatotomy was made. Under real-time sonographic guidance, the vessel was punctured with a 21 gauge micropuncture needle. Using standard technique, the initial micro needle was exchanged over a 0.018 micro wire for a transitional 4 [place] micro sheath. The micro sheath was then exchanged over a 0.035 wire for a 10 French fascial dilator. The percutaneous tract was dilated to 10 [place]. A Cook 10 French flexor sheath was advanced over the wire and into the IVC. A C2 cobra catheter was advanced over the Bentson wire and used to select the left renal vein. The catheter was directed into the lower pole of the left renal vein and venography was performed. Widely patent left renal vein. Reflux down the gonadal vein noted incidentally. A rose in wire was advanced into the lower pole renal vein. The C2 catheter was removed. The sheath was pulled back over the wire while contrast was administered in till the origin of the gastro renal shunt and adrenal vein was identified (PRES technique). A 65 cm angled 5 French glide catheter was introduced coaxially through the 10 [place] sheath over a glidewire. The catheter was advanced into the common origin of the left adrenal vein and gastro renal shunt. Contrast was injected identifying the anatomy. The catheter and wire were carefully manipulated into the gastro renal shunt.  Additional venography was performed confirming catheter location within the gastro renal shunt and no further filling of the left adrenal vein. A superstiff Amplatz wire was then advanced and deployed. The angled catheter was removed. The Rosen wire was removed. The 10 French sheath was carefully advanced over the superstiff Amplatz wire into the common origin of the gastro renal shunt and left adrenal vein. A [employer] standard occlusion balloon was then advanced over the wire coaxially through the sheath and into the gastro renal shunt. The occlusion balloon was then inflated and a balloon occluded venogram was performed of the gastro renal shunt and gastric varices. 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 There is a single escape veins via the left inferior phrenic vein. The inflow afferent vein appears to be the posterior gastric vein. A lantern microcatheter was advanced through the standard balloon occlusion catheter over a Fathom 16 wire. The catheter was advanced into the inferior phrenic vein. Contrast was injected confirming catheter location and anatomy. Coil embolization was then performed using a single 1 x 5 mm penumbra detachable coil. The microcatheter was next advanced deeper into  the gastro renal shunt. Contrast was injected with balloon inflated and deflated in till the anatomy of the gastro renal shunt and gastric varices was well evaluated. The balloon was taken down and all contrast allowed to washout. The occlusion balloon was then reinflated. A foam sclerosant comprised of 6 mL air 4 mL 3% sodium tetradecyl Sulfate and 2 mL Lipiodol was then prepared in the standard fashion. The sclerosant was slowly injected until there was complete filling of the gastric varices and the phone just started to enter the posterior gastric vein. The catheter was then gently flushed. The microcatheter was brought back toward the occlusion balloon and the gastro renal shunt was coil embolized with a  series of penumbra detachable microcoils. Once successful coil embolization was performed the occlusion balloon was slowly deflated under real-time fluoroscopy. No evidence of residual flow or mobilization of the foam. As the balloon occlusion catheter was pulled back, gentle injections of contrast material demonstrate patency of the left adrenal vein and the left renal vein. No evidence of complication. The catheter and sheath were removed and hemostasis attained by gentle manual pressure. IMPRESSION: Successful balloon occluded retrograde transvenous obliteration of gastric varices with coil embolization of the gastro renal shunt and left inferior phrenic vein. PLAN: 1. Continue to trend H and H and transfuse as needed. 2. BRTO protocol CT scan of the abdomen with and without contrast in 48 hours to assess gastric varices. 3. Patient will need follow-up upper endoscopy with GI in approximately 6 weeks to assess for enlarging esophageal varices. 4. Patient to follow-up with Interventional Radiology in 3 months with repeat BRTO protocol CT scan of the abdomen with and without contrast. Electronically Signed   By: Malachy Moan M.D.   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 EXAM: 1. Ultrasound-guided puncture right common femoral vein 2. Catheterization of the left renal vein with venogram 3. Catheterization of the common origin of the gastro renal shunt and left adrenal vein with venogram 4. Catheterization of the gastro renal shunt with venogram 5. Catheterization of inferior phrenic vein with venogram 6. Coil embolization of left inferior phrenic vein 7. Combined coil and foam embolization of gastric varices. MEDICATIONS: As antibiotic prophylaxis, 2 g Rocephin was ordered pre-procedure and administered intravenously  within one hour of incision. ANESTHESIA/SEDATION: General - as administered by the Anesthesia department CONTRAST:  35 mL Omnipaque 300 FLUOROSCOPY: Radiation Exposure Index (as provided by the fluoroscopic device): 427 mGy Kerma COMPLICATIONS: None immediate. PROCEDURE: Informed written consent was obtained from the patient after a thorough discussion of the procedural risks, benefits and alternatives. All questions were addressed. Maximal Sterile Barrier Technique was utilized including caps, mask, sterile gowns, sterile gloves, sterile drape, hand hygiene and skin antiseptic. A timeout was performed prior to the initiation of the procedure. The right common femoral vein was interrogated with ultrasound and found to be widely patent. An image was obtained and stored for the medical record. Local anesthesia was attained by infiltration with 1% lidocaine. A small dermatotomy was made. Under real-time sonographic guidance, the vessel was punctured with a 21 gauge micropuncture needle. Using standard technique, the initial micro needle was exchanged over a 0.018 micro wire for a transitional 4 [place] micro sheath. The micro sheath was then exchanged over a 0.035 wire for a 10 French fascial dilator. The percutaneous tract was  dilated to 10 [place]. A Cook 10 French flexor sheath was advanced over the wire and into the IVC. A C2 cobra catheter was advanced over the Bentson wire and used to select the left renal vein. The catheter was directed into the lower pole of the left renal vein and venography was performed. Widely patent left renal vein. Reflux down the gonadal vein noted incidentally. A rose in wire was advanced into the lower pole renal vein. The C2 catheter was removed. The sheath was pulled back over the wire while contrast was administered in till the origin of the gastro renal shunt and adrenal vein was identified (PRES technique). A 65 cm angled 5 French glide catheter was introduced coaxially through the  10 [place] sheath over a glidewire. The catheter was advanced into the common origin of the left adrenal vein and gastro renal shunt. Contrast was injected identifying the anatomy. The catheter and wire were carefully manipulated into the gastro renal shunt. Additional venography was performed confirming catheter location within the gastro renal shunt and no further filling of the left adrenal vein. A superstiff Amplatz wire was then advanced and deployed. The angled catheter was removed. The Rosen wire was removed. The 10 French sheath was carefully advanced over the superstiff Amplatz wire into the common origin of the gastro renal shunt and left adrenal vein. A [employer] standard occlusion balloon was then advanced over the wire coaxially through the sheath and into the gastro renal shunt. The occlusion balloon was then inflated and a balloon occluded venogram was performed of the gastro renal shunt and gastric varices. 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 There is a single escape veins via the left inferior phrenic vein. The inflow afferent vein appears to be the posterior gastric vein. A lantern microcatheter was advanced through the standard balloon occlusion catheter over a Fathom 16 wire. The catheter was advanced into the inferior phrenic vein. Contrast was injected confirming catheter location and anatomy. Coil embolization was then performed using a single 1 x 5 mm penumbra detachable coil. The microcatheter was next advanced deeper into the gastro renal shunt. Contrast was injected with balloon inflated and deflated in till the anatomy of the gastro renal shunt and gastric varices was well evaluated. The balloon was taken down and all contrast allowed to washout. The occlusion balloon was then reinflated. A foam sclerosant comprised of 6 mL air 4 mL 3% sodium tetradecyl Sulfate and 2 mL Lipiodol was then prepared in the standard fashion. The sclerosant was slowly injected until there was  complete filling of the gastric varices and the phone just started to enter the posterior gastric vein. The catheter was then gently flushed. The microcatheter was brought back toward the occlusion balloon and the gastro renal shunt was coil embolized with a series of penumbra detachable microcoils. Once successful coil embolization was performed the occlusion balloon was slowly deflated under real-time fluoroscopy. No evidence of residual flow or mobilization of the foam. As the balloon occlusion catheter was pulled back, gentle injections of contrast material demonstrate patency of the left adrenal vein and the left renal vein. No evidence of complication. The catheter and sheath were removed and hemostasis attained by gentle manual pressure. IMPRESSION: Successful balloon occluded retrograde transvenous obliteration of gastric varices with coil embolization of the gastro renal shunt and left inferior phrenic vein. PLAN: 1. Continue to trend H and H and transfuse as needed. 2. BRTO protocol CT scan of the abdomen with and without contrast in 48  hours to assess gastric varices. 3. Patient will need follow-up upper endoscopy with GI in approximately 6 weeks to assess for enlarging esophageal varices. 4. Patient to follow-up with Interventional Radiology in 3 months with repeat BRTO protocol CT scan of the abdomen with and without contrast. Electronically Signed   By: Malachy Moan M.D.   On: 08/05/2023 14:08    Assessment/Plan: Bleeding gastric varices. S/p BRTO of gastric varices Expected great result. Hgb 9.3 Pt clinically doing well. Plan repeat CT A/P BRTO protocol tomorrow.    LOS: 6 days   I spent a total of 20 minutes in face to face in clinical consultation, greater than 50% of which was counseling/coordinating care for BRTO gastric varices.  Brayton El PA-C 08/06/2023 10:09 AM

## 2023-08-06 NOTE — Plan of Care (Signed)
  Problem: Education: Goal: Ability to describe self-care measures that may prevent or decrease complications (Diabetes Survival Skills Education) will improve 08/06/2023 1757 by Coralie Common, RN Outcome: Progressing 08/06/2023 1121 by Coralie Common, RN Outcome: Progressing Goal: Individualized Educational Video(s) 08/06/2023 1757 by Coralie Common, RN Outcome: Progressing 08/06/2023 1121 by Coralie Common, RN Outcome: Progressing   Problem: Coping: Goal: Ability to adjust to condition or change in health will improve 08/06/2023 1757 by Coralie Common, RN Outcome: Progressing 08/06/2023 1121 by Coralie Common, RN Outcome: Progressing   Problem: Fluid Volume: Goal: Ability to maintain a balanced intake and output will improve 08/06/2023 1757 by Coralie Common, RN Outcome: Progressing 08/06/2023 1121 by Coralie Common, RN Outcome: Progressing

## 2023-08-07 ENCOUNTER — Inpatient Hospital Stay (HOSPITAL_COMMUNITY): Payer: Commercial Managed Care - PPO

## 2023-08-07 ENCOUNTER — Encounter (HOSPITAL_COMMUNITY): Payer: Self-pay | Admitting: Infectious Diseases

## 2023-08-07 ENCOUNTER — Other Ambulatory Visit: Payer: Self-pay | Admitting: Physician Assistant

## 2023-08-07 DIAGNOSIS — I6310 Cerebral infarction due to embolism of unspecified precerebral artery: Secondary | ICD-10-CM | POA: Diagnosis not present

## 2023-08-07 DIAGNOSIS — I639 Cerebral infarction, unspecified: Secondary | ICD-10-CM

## 2023-08-07 HISTORY — PX: IR RENAL SELECTIVE  UNI INC S&I MOD SED: IMG654

## 2023-08-07 HISTORY — PX: IR ANGIOGRAM VISCERAL SELECTIVE: IMG657

## 2023-08-07 LAB — IRON AND TIBC
Iron: 19 ug/dL — ABNORMAL LOW (ref 28–170)
Saturation Ratios: 5 % — ABNORMAL LOW (ref 10.4–31.8)
TIBC: 354 ug/dL (ref 250–450)
UIBC: 335 ug/dL

## 2023-08-07 LAB — GLUCOSE, CAPILLARY
Glucose-Capillary: 171 mg/dL — ABNORMAL HIGH (ref 70–99)
Glucose-Capillary: 154 mg/dL — ABNORMAL HIGH (ref 70–99)
Glucose-Capillary: 213 mg/dL — ABNORMAL HIGH (ref 70–99)
Glucose-Capillary: 173 mg/dL — ABNORMAL HIGH (ref 70–99)

## 2023-08-07 LAB — CBC
HCT: 27.6 % — ABNORMAL LOW (ref 36.0–46.0)
Hemoglobin: 9 g/dL — ABNORMAL LOW (ref 12.0–15.0)
MCH: 25.4 pg — ABNORMAL LOW (ref 26.0–34.0)
MCHC: 32.6 g/dL (ref 30.0–36.0)
MCV: 78 fL — ABNORMAL LOW (ref 80.0–100.0)
Platelets: 117 10*3/uL — ABNORMAL LOW (ref 150–400)
RBC: 3.54 MIL/uL — ABNORMAL LOW (ref 3.87–5.11)
RDW: 25.1 % — ABNORMAL HIGH (ref 11.5–15.5)
WBC: 8.1 10*3/uL (ref 4.0–10.5)
nRBC: 0.2 % (ref 0.0–0.2)

## 2023-08-07 LAB — FERRITIN: Ferritin: 14 ng/mL (ref 11–307)

## 2023-08-07 MED ORDER — IOHEXOL 350 MG/ML SOLN
100.0000 mL | Freq: Once | INTRAVENOUS | Status: AC | PRN
  Administered 2023-08-07: 100 mL via INTRAVENOUS

## 2023-08-07 MED ORDER — INSULIN GLARGINE-YFGN 100 UNIT/ML ~~LOC~~ SOLN
13.0000 [IU] | Freq: Two times a day (BID) | SUBCUTANEOUS | Status: DC
  Administered 2023-08-07 – 2023-08-08 (×2): 13 [IU] via SUBCUTANEOUS
  Filled 2023-08-07 (×3): qty 0.13

## 2023-08-07 NOTE — Progress Notes (Signed)
 Speech Language Pathology Treatment: Cognitive-Linquistic  Patient Details Name: Maria Rose MRN: [id number] DOB: October 26, 1955 Today's Date: 08/07/2023 Time: 9089-9067 SLP Time Calculation (min) (ACUTE ONLY): 22 min  Assessment / Plan / Recommendation Clinical Impression  Pt was much more alert and interactive than initial SLP evaluation. She believes that her speech and language are WFL, and no overt difficulties with either are noted by SLP throughout informal assessment. She completed a pill box task with Mod cues needed for selective attention in order to accurately identify errors, showing good problem solving of how to fix these errors once identified. Pt then also commented on other moments when she has noticed trouble attending to tasks. SLP provided education about compensatory strategies to use for now as well as recommendations for f/u therapy and safety once ready to discharge. She is motivated to continue SLP services.    HPI HPI: 67 y.o. female presents to [facility] hospital on 07/30/2023 after a fall and shaking episode. 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 Additional punctate acute infarcts in right cerebellum, left temporal lobe, left occipital lobe, right occipital lobe, right parietal lobe, right frontal lobe, right thalamus, left frontal lobe. Pt with bloody bowel movement and hematemesis on 12/24. PMH includes hepatic cirrhosis, gastric and splenic varices, sepsis.      SLP Plan  Continue with current plan of care      Recommendations for follow up therapy are one component of a multi-disciplinary discharge planning process, led by the attending physician.  Recommendations may be updated based on patient status, additional functional criteria and insurance authorization.    Recommendations                         Frequent or constant  Supervision/Assistance Cognitive communication deficit (R41.841)     Continue with current plan of care     Leita SAILOR., M.A. CCC-SLP Acute Rehabilitation Services Office [phone]  Secure chat preferred   08/07/2023, 9:56 AM

## 2023-08-07 NOTE — Progress Notes (Signed)
 Inpatient Rehab Coordinator Note:  I met with patient at bedside to discuss CIR recommendations and goals/expectations of CIR stay.  We reviewed 3 hrs/day of therapy, physician follow up, and average length of stay 2 weeks (dependent upon progress) with goals of mod I.  Patient lives with husband at home, my colleague, Genie met with spouse earlier this week. She is interested in pursuing CIR admission. Opened insurance today. Continue to follow for potential CIR admission.  Rehab Admissons Coordinator Gurtej Noyola, [place], [place] [phone]

## 2023-08-07 NOTE — Progress Notes (Signed)
 Physical Therapy Treatment Patient Details Name: Maria Rose MRN: [id number] DOB: 17-Aug-1955 Today's Date: 08/07/2023   History of Present Illness 67 y.o. female presents to [facility] hospital on 07/30/2023 after a fall and shaking episode. 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 Additional punctate acute infarcts in right cerebellum, left temporal lobe, left occipital lobe, right occipital lobe, right parietal lobe, right frontal lobe, right thalamus, left frontal lobe. Pt with bloody bowel movement and hematemesis on 12/24. PMH includes hepatic cirrhosis, gastric and splenic varices, sepsis.    PT Comments  Pt received standing at sink after using the bathroom and agreeable to session.  Pt able to tolerate increased gait distance without AD this session. Pt initially reaches to hallway rail for stability and demonstrates increased instability without UE support requiring CGA for safety. Pt continues to be limited by impaired balance and is unable to accept dynamic challenges due to instability. Pt educated on using RW in room for decreased fall risk with pt verbalizing understanding. Pt limited by fatigue and quickly returns to supine once back in the room. Pt continues to benefit from PT services to progress toward functional mobility goals.    If plan is discharge home, recommend the following: A lot of help with bathing/dressing/bathroom;Assistance with cooking/housework;Direct supervision/assist for medications management;Direct supervision/assist for financial management;Assist for transportation;Help with stairs or ramp for entrance;A little help with walking and/or transfers   Can travel by private vehicle        Equipment Recommendations  Rolling walker (2 wheels)    Recommendations for Other Services       Precautions / Restrictions Precautions Precautions: Fall;Other  (comment) Precaution Comments: L visual field deficits Restrictions Weight Bearing Restrictions Per Provider Order: No     Mobility  Bed Mobility Overal bed mobility: Needs Assistance Bed Mobility: Sit to Supine       Sit to supine: Supervision        Transfers                   General transfer comment: Pt received standing at sink    Ambulation/Gait Ambulation/Gait assistance: Contact guard assist Gait Distance (Feet): 300 Feet Assistive device: None Gait Pattern/deviations: Step-through pattern, Decreased stride length, Drifts right/left, Wide base of support Gait velocity: decreased     General Gait Details: Pt demonstrates slow, unsteady gait, but no LOB. CGA for safety and cues for upward gaze. Pt initially holding onto hallway rail for support, but is able to ambulate without with cues. Pt unable to accept any dynamic gait challenges due to instability.       Balance Overall balance assessment: Needs assistance Sitting-balance support: No upper extremity supported, Feet supported Sitting balance-Leahy Scale: Good     Standing balance support: Reliant on assistive device for balance, During functional activity, No upper extremity supported Standing balance-Leahy Scale: Fair Standing balance comment: No AD with some instability requiring CGA                            Cognition Arousal: Alert Behavior During Therapy: Lability Overall Cognitive Status: History of cognitive impairments - at baseline                                 General Comments: intermittent tearfulness  Exercises      General Comments General comments (skin integrity, edema, etc.): VSS on RA      Pertinent Vitals/Pain Pain Assessment Pain Assessment: Faces Faces Pain Scale: No hurt     PT Goals (current goals can now be found in the care plan section) Acute Rehab PT Goals Patient Stated Goal: to return to independence PT Goal  Formulation: With patient Time For Goal Achievement: 08/14/23 Progress towards PT goals: Progressing toward goals    Frequency    Min 1X/week       AM-PAC PT 6 Clicks Mobility   Outcome Measure  Help needed turning from your back to your side while in a flat bed without using bedrails?: A Little Help needed moving from lying on your back to sitting on the side of a flat bed without using bedrails?: A Little Help needed moving to and from a bed to a chair (including a wheelchair)?: A Little Help needed standing up from a chair using your arms (e.g., wheelchair or bedside chair)?: A Little Help needed to walk in hospital room?: A Little Help needed climbing 3-5 steps with a railing? : A Lot 6 Click Score: 17    End of Session Equipment Utilized During Treatment: Gait belt Activity Tolerance: Patient tolerated treatment well Patient left: in bed;with call bell/phone within reach Nurse Communication: Mobility status PT Visit Diagnosis: Other abnormalities of gait and mobility (R26.89);Muscle weakness (generalized) (M62.81)     Time: 8988-8978 PT Time Calculation (min) (ACUTE ONLY): 10 min  Charges:    $Gait Training: 8-22 mins PT General Charges $$ ACUTE PT VISIT: 1 Visit                     Darryle George, PTA Acute Rehabilitation Services Secure Chat Preferred  [phone]    Darryle George 08/07/2023, 10:30 AM

## 2023-08-07 NOTE — Progress Notes (Signed)
 At the request of primary team, will order 30 day event monitor, Dr. Jacques Navy to read. Will arrange new patient visit with Dr. Jacques Navy in 2-3 month to review monitor.

## 2023-08-07 NOTE — Plan of Care (Signed)

## 2023-08-07 NOTE — Progress Notes (Signed)
 CVAD removed per protocol per MD order. Manual pressure applied for 15 mins. Vaseline gauze, gauze, and tape applied over insertion site. No bleeding or swelling noted. Instructed patient to remain in bed for thirty mins. Educated patient about S/S of infection and when to call MD; keep dressing dry and intact for 24 hours. Pt verbalized comprehension.

## 2023-08-07 NOTE — Progress Notes (Addendum)
 HD#7 SUBJECTIVE:  Patient Summary: Maria Rose is a 67 YO F with no past medical history presenting to the ED with a three day history of headache and syncopal episode and admitted for stroke workup.  Overnight Events: None  Interim History: Patient was evaluated bedside. Husband was present. Says she feels well with no concerns this morning. Able to ambulate to the restroom on her own. Spoke with them about her stroke and the options to work it up outpatient.  OBJECTIVE:  Vital Signs: Vitals:   08/06/23 2044 08/06/23 2300 08/07/23 0412 08/07/23 0826  BP: 119/79 135/65 (!) 119/58 (!) 104/59  Pulse: 75 71 72 87  Resp: 18 18 18    Temp: 98.2 F (36.8 C) 98 F (36.7 C) 98.1 F (36.7 C) 98.4 F (36.9 C)  TempSrc: Oral Oral Oral Oral  SpO2: 99% 98% 99% 100%  Weight:      Height:       Supplemental O2: Room Air  Filed Weights   07/30/23 1132 08/01/23 0430 08/05/23 0955  Weight: 81.6 kg 86.7 kg 86.7 kg    Intake/Output Summary (Last 24 hours) at 08/07/2023 1051 Last data filed at 08/07/2023 0246 Gross per 24 hour  Intake 240 ml  Output --  Net 240 ml   Net IO Since Admission: 8,589.59 mL [08/07/23 1051]  CBC    Component Value Date/Time   WBC 8.1 08/07/2023 0500   RBC 3.54 (L) 08/07/2023 0500   HGB 9.0 (L) 08/07/2023 0500   HCT 27.6 (L) 08/07/2023 0500   PLT 117 (L) 08/07/2023 0500   MCV 78.0 (L) 08/07/2023 0500   MCH 25.4 (L) 08/07/2023 0500   MCHC 32.6 08/07/2023 0500   RDW 25.1 (H) 08/07/2023 0500   LYMPHSABS 3.9 07/31/2023 0222   MONOABS 0.9 07/31/2023 0222   EOSABS 0.0 07/31/2023 0222   BASOSABS 0.0 07/31/2023 0222   Physical Exam: Physical Exam Constitutional:      Appearance: Normal appearance.  HENT:     Head: Normocephalic and atraumatic.  Neck:     Comments: Central line in place Cardiovascular:     Rate and Rhythm: Normal rate and regular rhythm.     Pulses: Normal pulses.     Heart sounds: Normal heart sounds.  Pulmonary:     Effort:  Pulmonary effort is normal.     Breath sounds: Normal breath sounds.  Abdominal:     General: Abdomen is flat.     Palpations: Abdomen is soft.     Tenderness: There is no abdominal tenderness.  Musculoskeletal:     Comments: access site on right groin clean and dry; no erythema or tenderness  Neurological:     Mental Status: She is alert and oriented to person, place, and time.    ASSESSMENT/PLAN:  Assessment: Principal Problem:   Cerebrovascular accident (CVA) (HCC) Active Problems:   Hyperglycemia   Decompensated cirrhosis (HCC)   Gastric varices   Splenic varices   Splenic infarct   Bleeding esophageal varices (HCC)  Maria Rose is a 67 YO F with no past medical history presenting to the ED with a three day history of headache and syncopal episode and admitted for stroke workup and new onset decompensated cirrhosis.  Plan: Stroke, likely embolic No new neurologic deficits. Per cardiology, her varices are a relative contraindication to TEE. She could pursue an outpatient repeat TTE or cardiac CT instead. Spoke with Dr. Rosemarie from the stroke neurology team who recommended ASA 81 mg for antiplatelet therapy at discharge.  She is a poor candidate for anticoagulation. Once her post-procedure imaging is complete, she is stable from a medical standpoint to be discharged to CIR/SNF.  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 Iron  labs showed iron  deficiency with a ferritin of 14. Will hold off on treating with oral iron  for now, given recent GI irritation and identified source of deficit (variceal bleed). May need outpatient oral iron   repletion. CT angio A/P BRTO planned today. Platelets low but stable. No acute concerns.  - CT angio (BRTO protocol) today - Will discontinue central line - Continue CM diet  T2DM Hemoglobin A1c 11.4. BG elevated in the 170-220 range, so we will increase her long-acting insulin .  - Carb modified diet - Increased Semglee  to 13 units BID; continue SSI - Trend CBG  Chronic interstitial lung disease, probable Usual interstitial pneumonia Unclear etiology. Small lung nodule seen on imaging. Will need outpatient pulmonology follow up.   Best Practice: Diet: CM diet VTE: SCDs Start: 07/30/23 1620 Code: Full AB: None Therapy Recs: CIR DISPO: Anticipated discharge  1-2 days  to  CIR  pending  CIR evaluation .  Signature: Ozell Riff, MD Internal Medicine Resident, PGY-1 [facility]  Pager: [phone]  Please contact the on call pager after 5 pm and on weekends at [phone].

## 2023-08-08 DIAGNOSIS — I6310 Cerebral infarction due to embolism of unspecified precerebral artery: Secondary | ICD-10-CM | POA: Diagnosis not present

## 2023-08-08 DIAGNOSIS — K55069 Acute infarction of intestine, part and extent unspecified: Secondary | ICD-10-CM | POA: Insufficient documentation

## 2023-08-08 LAB — CBC
HCT: 27.5 % — ABNORMAL LOW (ref 36.0–46.0)
Hemoglobin: 8.9 g/dL — ABNORMAL LOW (ref 12.0–15.0)
MCH: 25 pg — ABNORMAL LOW (ref 26.0–34.0)
MCHC: 32.4 g/dL (ref 30.0–36.0)
MCV: 77.2 fL — ABNORMAL LOW (ref 80.0–100.0)
Platelets: 114 10*3/uL — ABNORMAL LOW (ref 150–400)
RBC: 3.56 MIL/uL — ABNORMAL LOW (ref 3.87–5.11)
RDW: 25.6 % — ABNORMAL HIGH (ref 11.5–15.5)
WBC: 6.9 10*3/uL (ref 4.0–10.5)
nRBC: 0 % (ref 0.0–0.2)

## 2023-08-08 LAB — HEPARIN LEVEL (UNFRACTIONATED): Heparin Unfractionated: 0.21 [IU]/mL — ABNORMAL LOW (ref 0.30–0.70)

## 2023-08-08 LAB — GLUCOSE, CAPILLARY
Glucose-Capillary: 170 mg/dL — ABNORMAL HIGH (ref 70–99)
Glucose-Capillary: 161 mg/dL — ABNORMAL HIGH (ref 70–99)
Glucose-Capillary: 155 mg/dL — ABNORMAL HIGH (ref 70–99)
Glucose-Capillary: 214 mg/dL — ABNORMAL HIGH (ref 70–99)

## 2023-08-08 MED ORDER — INSULIN GLARGINE-YFGN 100 UNIT/ML ~~LOC~~ SOLN
14.0000 [IU] | Freq: Two times a day (BID) | SUBCUTANEOUS | Status: DC
  Administered 2023-08-08 – 2023-08-09 (×2): 14 [IU] via SUBCUTANEOUS
  Filled 2023-08-08 (×3): qty 0.14

## 2023-08-08 MED ORDER — HEPARIN (PORCINE) 25000 UT/250ML-% IV SOLN
1500.0000 [IU]/h | INTRAVENOUS | Status: DC
Start: 2023-08-08 — End: 2023-08-11
  Administered 2023-08-08: 1300 [IU]/h via INTRAVENOUS
  Administered 2023-08-09 – 2023-08-10 (×3): 1400 [IU]/h via INTRAVENOUS
  Administered 2023-08-11: 1500 [IU]/h via INTRAVENOUS
  Filled 2023-08-08 (×4): qty 250

## 2023-08-08 MED ORDER — POLYETHYLENE GLYCOL 3350 17 G PO PACK
17.0000 g | PACK | Freq: Every day | ORAL | Status: DC | PRN
  Administered 2023-08-09: 17 g via ORAL
  Filled 2023-08-08: qty 1

## 2023-08-08 NOTE — Progress Notes (Signed)
 PHARMACY - ANTICOAGULATION CONSULT NOTE  Pharmacy Consult for heparin  Indication: VTE treatment   No Known Allergies  Patient Measurements: Height: 5' 5.98 (167.6 cm) Weight: 86.7 kg (191 lb 2.2 oz) IBW/kg (Calculated) : 59.26 Heparin  Dosing Weight: 78 kg   Vital Signs: Temp: 98.9 F (37.2 C) (01/01 0849) Temp Source: Axillary (01/01 0849) BP: 121/55 (01/01 0849) Pulse Rate: 82 (01/01 0251)  Labs: Recent Labs    08/06/23 0450 08/07/23 0500 08/08/23 0402  HGB 9.3* 9.0* 8.9*  HCT 28.3* 27.6* 27.5*  PLT 116* 117* 114*  CREATININE 0.69  --   --     Estimated Creatinine Clearance: 74.7 mL/min (by C-G formula based on SCr of 0.69 mg/dL).   Medical History: Past Medical History:  Diagnosis Date   Arthritis    bilateral knees   Diabetes mellitus without complication (HCC)    GI bleed 07/31/2023    Medications:  Scheduled:   sodium chloride    Intravenous Once   sodium chloride    Intravenous Once   Chlorhexidine  Gluconate Cloth  6 each Topical Daily   insulin  aspart  0-15 Units Subcutaneous TID WC   insulin  glargine-yfgn  13 Units Subcutaneous BID   mupirocin  ointment   Nasal BID   pantoprazole  (PROTONIX ) IV  40 mg Intravenous Q12H   Infusions:   promethazine  (PHENERGAN ) injection (IM or IVPB) Stopped (07/31/23 0856)   sodium chloride      PRN: acetaminophen , dextrose , Muscle Rub, ondansetron  (ZOFRAN ) IV, mouth rinse, promethazine  (PHENERGAN ) injection (IM or IVPB), sodium chloride   Assessment: 68 YO F with no past medical history presenting to the ED with a stroke (most likely embolic) and now resolved variceal bleed s/p BRTO. CT angiogram from 12/31 showed new short-segment focus of eccentric nonocclusive mural thrombus in the SMV. Patient was not on any anticoagulants PTA. Pharmacy consulted to start heparin  with clearance from IM and IR.   CBC okay - hgb 8.9 and plts 114. Due to patient's recent bleed, plan to not bolus patient.   Goal of Therapy:  Heparin   level 0.3-0.7 units/ml Monitor platelets by anticoagulation protocol: Yes   Plan:  Start heparin  infusion at 1,300 units/hr Check anti-Xa level in 6 hours and daily while on heparin  Continue to monitor H&H and platelets  Lum Laine, PharmD PGY1 Pharmacy Resident 08/08/2023 9:21 AM

## 2023-08-08 NOTE — Progress Notes (Addendum)
 HD#8 SUBJECTIVE:  Patient Summary: Maria Rose is a 68 YO F with no past medical history presenting to the ED with a three day history of headache and syncopal episode and admitted for stroke workup.  Overnight Events: None  Interim History: Patient was evaluated bedside. She feels well this morning. No abdominal pain, nausea or vomiting. No bowel movements yet. Has not noticed any bleeding. Spoke with her about the thrombus seen on CT.   OBJECTIVE:  Vital Signs: Vitals:   08/07/23 1635 08/07/23 1946 08/08/23 0251 08/08/23 0849  BP: (!) 130/59 (!) 100/45 (!) 104/51 (!) 121/55  Pulse: 87 82 82   Resp:      Temp: 97.7 F (36.5 C) 97.7 F (36.5 C) 98.9 F (37.2 C) 98.9 F (37.2 C)  TempSrc: Oral Oral Oral Axillary  SpO2: 100% 96% 100%   Weight:      Height:       Supplemental O2: Room Air  Filed Weights   07/30/23 1132 08/01/23 0430 08/05/23 0955  Weight: 81.6 kg 86.7 kg 86.7 kg   No intake or output data in the 24 hours ending 08/08/23 1028  Net IO Since Admission: 8,589.59 mL [08/08/23 1028]  CBC    Component Value Date/Time   WBC 6.9 08/08/2023 0402   RBC 3.56 (L) 08/08/2023 0402   HGB 8.9 (L) 08/08/2023 0402   HCT 27.5 (L) 08/08/2023 0402   PLT 114 (L) 08/08/2023 0402   MCV 77.2 (L) 08/08/2023 0402   MCH 25.0 (L) 08/08/2023 0402   MCHC 32.4 08/08/2023 0402   RDW 25.6 (H) 08/08/2023 0402   LYMPHSABS 3.9 07/31/2023 0222   MONOABS 0.9 07/31/2023 0222   EOSABS 0.0 07/31/2023 0222   BASOSABS 0.0 07/31/2023 0222   Physical Exam: Physical Exam Constitutional:      Appearance: Normal appearance.  HENT:     Head: Normocephalic and atraumatic.  Cardiovascular:     Rate and Rhythm: Normal rate and regular rhythm.     Pulses: Normal pulses.     Heart sounds: Normal heart sounds.  Pulmonary:     Effort: Pulmonary effort is normal.     Breath sounds: Normal breath sounds.  Abdominal:     General: Abdomen is flat.     Palpations: Abdomen is soft.      Tenderness: There is no abdominal tenderness.  Musculoskeletal:        General: Normal range of motion.  Neurological:     Mental Status: She is alert and oriented to person, place, and time.    ASSESSMENT/PLAN:  Assessment: Principal Problem:   Cerebrovascular accident (CVA) (HCC) Active Problems:   Hyperglycemia   Decompensated cirrhosis (HCC)   Gastric varices   Splenic varices   Splenic infarct   Bleeding esophageal varices (HCC)  Maria Rose is a 68 YO F with no past medical history presenting to the ED with a three day history of headache and syncopal episode and admitted for stroke workup and new onset decompensated cirrhosis.  Plan: Acute SMV thrombus New non-occlusive mural thrombus noted on post-BRTO CT angio. Patient is currently hemodynamically stable and asymptomatic. Spoke with the patient about starting anticoagulation with heparin  and the risks of bleeding, instructed her to notify us  if she has any new abdominal symptoms or notes any bleeding. She is at heightened risk for this given her cirrhosis and recent variceal bleeding, but we feel the benefits of treating her acute venous thrombus outweigh these risks.  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 Platelets low but stable. No new bleeding or abdominal symptoms reported this morning. Post-BRTO CT angio showed successful occlusion of gastric varices and splenorenal shunt. - IR following; appreciate recs - May need outpatient iron  repletion - Continue CM diet  Stroke, likely embolic No neurologic deficits on exam. She will likely need at least 3 months of anticoagulation at discharge given her new VTE, but we will see how she does on heparin  while  in the hospital. Neurology previously recommended ASA 81 mg daily at discharge; will touch base with them once her acute problems are treated about sending her home with a DOAC.  - Ambulatory cardiac monitoring for 30 days at discharge - Outpatient f/u with stroke clinic at [facility] in 4 weeks - Outpatient f/u with cardiology  T2DM Hemoglobin A1c 11.4. Fasting blood sugar 170 this morning, still elevated above goal, so we will keep titrating her long-acting insulin .  - Carb modified diet - Increased Semglee  to 14 units BID; continue SSI - Trend CBG  Chronic interstitial lung disease, probable Usual interstitial pneumonia Unclear etiology. Small lung nodule seen on imaging. Will need outpatient pulmonology follow up.   Best Practice: Diet: CM diet VTE: IV heparin  Code: Full AB: None Therapy Recs: CIR DISPO: Anticipated discharge  2-4 days  to  CIR  pending  VTE treatment and CIR insurance approval .  Signature: Ozell Riff, MD Internal Medicine Resident, PGY-1 [facility]  Pager: [phone]  Please contact the on call pager after 5 pm and on weekends at [phone].

## 2023-08-08 NOTE — Progress Notes (Signed)
 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 She denies pain or discomfort but she is emotional/crying about the clot in the SMV.   Allergies: Patient has no known allergies.  Medications: Prior to Admission medications   Medication Sig Start Date End Date Taking? Authorizing Provider  clotrimazole  (LOTRIMIN ) 1 % cream Apply to affected area 2 times daily 05/16/23  Yes Geiple, Joshua, PA-C  erythromycin ophthalmic ointment Place 1 Application into the left eye 4 (four) times daily. 06/03/23  Yes [provider]  mupirocin  ointment (BACTROBAN ) 2 % Apply 1 Application topically 2 (two) times daily. 06/26/23  Yes [provider]     Vital Signs: BP (!) 121/55 (BP Location: Left Arm)   Pulse 82   Temp 98.9 F (37.2 C) (Axillary)   Resp 18   Ht 5' 5.98 (1.676 m)   Wt 191 lb 2.2 oz (86.7 kg)   SpO2 100%   BMI 30.87 kg/m   Physical Exam Constitutional:      General: She is not in acute distress.    Appearance: She is not ill-appearing.  Cardiovascular:     Rate and Rhythm: Normal rate.     Comments: Right femoral vascular site is unremarkable per documentation in Epic.  Pulmonary:     Effort: Pulmonary effort is normal.  Abdominal:     Tenderness: There is no abdominal tenderness.  Skin:    General: Skin is warm and dry.     Coloration: Skin is not jaundiced.  Neurological:     Mental Status: She is alert and oriented to person, place, and time.     Imaging: CT ANGIO ABD/PELVIS BRTO Result Date: 08/08/2023 CLINICAL DATA:  Gastric varices, post BRTO EXAM: CTA ABDOMEN AND PELVIS WITHOUT AND WITH CONTRAST TECHNIQUE: Multidetector CT imaging of the abdomen and pelvis was performed using the standard protocol during bolus administration of intravenous contrast. Multiplanar reconstructed images and MIPs were obtained and reviewed to  evaluate the vascular anatomy. RADIATION DOSE REDUCTION: This exam was performed according to the departmental dose-optimization program which includes automated exposure control, adjustment of the mA and/or kV according to patient size and/or use of iterative reconstruction technique. CONTRAST:  OMNIPAQUE  IOHEXOL  350 MG/ML SOLN COMPARISON:  08/01/2023 FINDINGS: VASCULAR Aorta: Moderate partially calcified atheromatous plaque without aneurysm, dissection, or stenosis. Mural thrombus in the suprarenal segment as before, potential embolic risk. Celiac: Patent without evidence of aneurysm, dissection, vasculitis or significant stenosis. SMA: Patent without evidence of aneurysm, dissection, vasculitis or significant stenosis. Renals: Both renal arteries are patent without evidence of aneurysm, dissection, vasculitis, fibromuscular dysplasia or significant stenosis. IMA: Patent without evidence of aneurysm, dissection, vasculitis or significant stenosis. Inflow: Scattered nonocclusive partially calcified plaque in bilateral common and internal iliac arteries. Mild eccentric plaque in the mid left external iliac artery with only mild stenosis. Proximal Outflow: Mildly atheromatous, patent. Veins: Residual embolic material in gastric varices and splenorenal shunt, consistent with successful occlusion. Short-segment focus of eccentric nonocclusive mural thrombus in the SMV, new since previous. No embolic material in the portal venous system or liver. Patent hepatic veins, portal vein, splenic vein, IMV, bilateral renal veins. Incomplete opacification of iliac venous system and IVC attributed to scan timing. Review of the MIP images confirms the above findings. NON-VASCULAR Lower chest: No pleural or pericardial effusion. Chronic interstitial changes in the visualized lung bases. Hepatobiliary: Mild nodular contour. No focal  lesion or biliary ductal dilatation. Gallbladder unremarkable. Pancreas: Unremarkable. No  pancreatic ductal dilatation or surrounding inflammatory changes. Spleen: Stable mild splenomegaly without focal lesion. Adrenals/Urinary Tract: No adrenal mass. Symmetric renal parenchymal enhancement without focal lesion, urolithiasis, or hydronephrosis. Urinary bladder partially distended, containing small gas bubble presumably from recent instrumentation. Stomach/Bowel: Stomach is nondistended with embolic material and previously demonstrated gastric varices. Small bowel decompressed. Normal appendix. Colon partially distended with scattered descending and sigmoid diverticula; no adjacent inflammatory change. Lymphatic: No abdominal or pelvic adenopathy. Reproductive: Uterus and bilateral adnexa are unremarkable. Other: No ascites.  No free air. Musculoskeletal: Mild lumbar levoscoliosis apex L3 with multilevel spondylitic change. 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 IMPRESSION: 1. Successful occlusion of gastric varices and splenorenal shunt. 2. Short-segment focus of eccentric nonocclusive mural thrombus in the SMV, new since previous. 3.   Colonic diverticulosis. 4.  Aortic Atherosclerosis (ICD10-I70.0). Electronically Signed   By: JONETTA Faes M.D.   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 EXAM: 1. Ultrasound-guided puncture right common femoral vein 2. Catheterization of the left renal vein with venogram 3. Catheterization of the common origin of the gastro renal shunt and left adrenal vein with venogram 4. Catheterization of the gastro renal shunt with venogram 5. Catheterization of inferior phrenic vein with venogram 6. Coil embolization of left inferior phrenic vein 7. Combined coil and foam embolization of gastric varices. MEDICATIONS: As antibiotic prophylaxis, 2 g  Rocephin  was ordered pre-procedure and administered intravenously within one hour of incision. ANESTHESIA/SEDATION: General - as administered by the Anesthesia department CONTRAST:  35 mL Omnipaque  300 FLUOROSCOPY: Radiation Exposure Index (as provided by the fluoroscopic device): 427 mGy Kerma COMPLICATIONS: None immediate. PROCEDURE: Informed written consent was obtained from the patient after a thorough discussion of the procedural risks, benefits and alternatives. All questions were addressed. Maximal Sterile Barrier Technique was utilized including caps, mask, sterile gowns, sterile gloves, sterile drape, hand hygiene and skin antiseptic. A timeout was performed prior to the initiation of the procedure. The right common femoral vein was interrogated with ultrasound and found to be widely patent. An image was obtained and stored for the medical record. Local anesthesia was attained by infiltration with 1% lidocaine . A small dermatotomy was made. Under real-time sonographic guidance, the vessel was punctured with a 21 gauge micropuncture needle. Using standard technique, the initial micro needle was exchanged over a 0.018 micro wire for a transitional 4 French micro sheath. The micro sheath was then exchanged over a 0.035 wire for a 10 French fascial dilator. The percutaneous tract was dilated to 10 French. A Cook 10 French flexor sheath was advanced over the wire and into the IVC. A C2 cobra catheter was advanced over the Bentson wire and used to select the left renal vein. The catheter was directed into the lower pole of the left renal vein and venography was performed. Widely patent left renal vein. Reflux down the gonadal vein noted incidentally. A rose in wire was advanced into the lower pole renal vein. The C2 catheter was removed. The sheath was pulled back over the wire while contrast was administered in till the origin of the gastro renal shunt and adrenal vein was identified (PRES technique). A 65 cm  angled 5 French glide catheter was introduced coaxially through the 10 French sheath over a glidewire.  The catheter was advanced into the common origin of the left adrenal vein and gastro renal shunt. Contrast was injected identifying the anatomy. The catheter and wire were carefully manipulated into the gastro renal shunt. Additional venography was performed confirming catheter location within the gastro renal shunt and no further filling of the left adrenal vein. A superstiff Amplatz wire was then advanced and deployed. The angled catheter was removed. The Rosen wire was removed. The 10 French sheath was carefully advanced over the superstiff Amplatz wire into the common origin of the gastro renal shunt and left adrenal vein. A [employer] standard occlusion balloon was then advanced over the wire coaxially through the sheath and into the gastro renal shunt. The occlusion balloon was then inflated and a balloon occluded venogram was performed of the gastro renal shunt and gastric varices. 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 There is a single escape veins via the left inferior phrenic vein. The inflow afferent vein appears to be the posterior gastric vein. A lantern microcatheter was advanced through the standard balloon occlusion catheter over a Fathom 16 wire. The catheter was advanced into the inferior phrenic vein. Contrast was injected confirming catheter location and anatomy. Coil embolization was then performed using a single 1 x 5 mm penumbra detachable coil. The microcatheter was next advanced deeper into the gastro renal shunt. Contrast was injected with balloon inflated and deflated in till the anatomy of the gastro renal shunt and gastric varices was well evaluated. The balloon was taken down and all contrast allowed to washout. The occlusion balloon was then reinflated. A foam sclerosant comprised of 6 mL air 4 mL 3% sodium tetradecyl Sulfate  and 2 mL Lipiodol was then prepared in the  standard fashion. The sclerosant was slowly injected until there was complete filling of the gastric varices and the phone just started to enter the posterior gastric vein. The catheter was then gently flushed. The microcatheter was brought back toward the occlusion balloon and the gastro renal shunt was coil embolized with a series of penumbra detachable microcoils. Once successful coil embolization was performed the occlusion balloon was slowly deflated under real-time fluoroscopy. No evidence of residual flow or mobilization of the foam. As the balloon occlusion catheter was pulled back, gentle injections of contrast material demonstrate patency of the left adrenal vein and the left renal vein. No evidence of complication. The catheter and sheath were removed and hemostasis attained by gentle manual pressure. IMPRESSION: Successful balloon occluded retrograde transvenous obliteration of gastric varices with coil embolization of the gastro renal shunt and left inferior phrenic vein. PLAN: 1. Continue to trend H and H and transfuse as needed. 2. BRTO protocol CT scan of the abdomen with and without contrast in 48 hours to assess gastric varices. 3. Patient will need follow-up upper endoscopy with GI in approximately 6 weeks to assess for enlarging esophageal varices. 4. Patient to follow-up with Interventional Radiology in 3 months with repeat BRTO protocol CT scan of the abdomen with and without contrast. Electronically Signed   By: Wilkie Lent M.D.   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 EXAM: 1. Ultrasound-guided puncture right common femoral vein 2. Catheterization of the left renal vein with venogram 3. Catheterization of the common origin of the gastro renal shunt and left adrenal vein with venogram 4.  Catheterization of the  gastro renal shunt with venogram 5. Catheterization of inferior phrenic vein with venogram 6. Coil embolization of left inferior phrenic vein 7. Combined coil and foam embolization of gastric varices. MEDICATIONS: As antibiotic prophylaxis, 2 g Rocephin  was ordered pre-procedure and administered intravenously within one hour of incision. ANESTHESIA/SEDATION: General - as administered by the Anesthesia department CONTRAST:  35 mL Omnipaque  300 FLUOROSCOPY: Radiation Exposure Index (as provided by the fluoroscopic device): 427 mGy Kerma COMPLICATIONS: None immediate. PROCEDURE: Informed written consent was obtained from the patient after a thorough discussion of the procedural risks, benefits and alternatives. All questions were addressed. Maximal Sterile Barrier Technique was utilized including caps, mask, sterile gowns, sterile gloves, sterile drape, hand hygiene and skin antiseptic. A timeout was performed prior to the initiation of the procedure. The right common femoral vein was interrogated with ultrasound and found to be widely patent. An image was obtained and stored for the medical record. Local anesthesia was attained by infiltration with 1% lidocaine . A small dermatotomy was made. Under real-time sonographic guidance, the vessel was punctured with a 21 gauge micropuncture needle. Using standard technique, the initial micro needle was exchanged over a 0.018 micro wire for a transitional 4 French micro sheath. The micro sheath was then exchanged over a 0.035 wire for a 10 French fascial dilator. The percutaneous tract was dilated to 10 French. A Cook 10 French flexor sheath was advanced over the wire and into the IVC. A C2 cobra catheter was advanced over the Bentson wire and used to select the left renal vein. The catheter was directed into the lower pole of the left renal vein and venography was performed. Widely patent left renal vein. Reflux down the gonadal vein noted incidentally.  A rose in wire was advanced into the lower pole renal vein. The C2 catheter was removed. The sheath was pulled back over the wire while contrast was administered in till the origin of the gastro renal shunt and adrenal vein was identified (PRES technique). A 65 cm angled 5 French glide catheter was introduced coaxially through the 10 French sheath over a glidewire. The catheter was advanced into the common origin of the left adrenal vein and gastro renal shunt. Contrast was injected identifying the anatomy. The catheter and wire were carefully manipulated into the gastro renal shunt. Additional venography was performed confirming catheter location within the gastro renal shunt and no further filling of the left adrenal vein. A superstiff Amplatz wire was then advanced and deployed. The angled catheter was removed. The Rosen wire was removed. The 10 French sheath was carefully advanced over the superstiff Amplatz wire into the common origin of the gastro renal shunt and left adrenal vein. A [employer] standard occlusion balloon was then advanced over the wire coaxially through the sheath and into the gastro renal shunt. The occlusion balloon was then inflated and a balloon occluded venogram was performed of the gastro renal shunt and gastric varices. 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 There is a single escape veins via the left inferior phrenic vein. The inflow afferent vein appears to be the posterior gastric vein. A lantern microcatheter was advanced through the standard balloon occlusion catheter over a Fathom 16 wire. The catheter was advanced into the inferior phrenic vein. Contrast was injected confirming catheter location and anatomy. Coil embolization was then performed using a single 1 x 5 mm penumbra detachable coil. The microcatheter was next advanced deeper into the gastro renal shunt. Contrast was injected with balloon inflated and deflated in  till the anatomy of the gastro renal shunt and  gastric varices was well evaluated. The balloon was taken down and all contrast allowed to washout. The occlusion balloon was then reinflated. A foam sclerosant comprised of 6 mL air 4 mL 3% sodium tetradecyl Sulfate  and 2 mL Lipiodol was then prepared in the standard fashion. The sclerosant was slowly injected until there was complete filling of the gastric varices and the phone just started to enter the posterior gastric vein. The catheter was then gently flushed. The microcatheter was brought back toward the occlusion balloon and the gastro renal shunt was coil embolized with a series of penumbra detachable microcoils. Once successful coil embolization was performed the occlusion balloon was slowly deflated under real-time fluoroscopy. No evidence of residual flow or mobilization of the foam. As the balloon occlusion catheter was pulled back, gentle injections of contrast material demonstrate patency of the left adrenal vein and the left renal vein. No evidence of complication. The catheter and sheath were removed and hemostasis attained by gentle manual pressure. IMPRESSION: Successful balloon occluded retrograde transvenous obliteration of gastric varices with coil embolization of the gastro renal shunt and left inferior phrenic vein. PLAN: 1. Continue to trend H and H and transfuse as needed. 2. BRTO protocol CT scan of the abdomen with and without contrast in 48 hours to assess gastric varices. 3. Patient will need follow-up upper endoscopy with GI in approximately 6 weeks to assess for enlarging esophageal varices. 4. Patient to follow-up with Interventional Radiology in 3 months with repeat BRTO protocol CT scan of the abdomen with and without contrast. Electronically Signed   By: Wilkie Lent M.D.   On: 08/05/2023 14:08    Labs:  CBC: Recent Labs    08/05/23 0420 08/06/23 0450 08/07/23 0500 08/08/23 0402  WBC 7.4 10.3 8.1 6.9  HGB 7.9* 9.3* 9.0* 8.9*  HCT 24.5* 28.3* 27.6* 27.5*  PLT  105* 116* 117* 114*    COAGS: Recent Labs    07/31/23 0222 08/05/23 0420  INR 1.4* 1.0    BMP: Recent Labs    08/01/23 0315 08/04/23 0305 08/05/23 0420 08/06/23 0450  NA 137 139 136 136  K 3.4* 3.4* 3.8 3.7  CL 108 104 102 102  CO2 23 25 25 23   GLUCOSE 104* 101* 185* 251*  BUN 24* 7* 8 11  CALCIUM  7.4* 8.2* 8.3* 8.6*  CREATININE 0.59 0.52 0.63 0.69  GFRNONAA >60 >60 >60 >60    LIVER FUNCTION TESTS: Recent Labs    07/30/23 1219 07/31/23 0222 08/05/23 0420 08/06/23 0450  BILITOT 0.7 0.6 0.7 0.8  AST 29 43* 28 30  ALT 24 34 34 33  ALKPHOS 59 35* 50 52  PROT 6.0* 3.9* 5.6* 6.2*  ALBUMIN  3.2* 2.2* 3.0* 3.4*    Assessment and Plan:  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 CTA performed yesterday showed successful occlusion of the gastric varices and splenorenal shunt. It also showed a non-occlusive mural thrombus in the SMV. These findings were discussed with the patient along with our recommendation for IV anticoagulation. IV heparin  has been ordered by the primary team.   Labs and vital signs are stable. IR will continue to follow.    Electronically Signed: Warren Dais, AGACNP-BC 08/08/2023, 10:14 AM   I spent a total of 15 Minutes at the the patient's bedside AND on the patient's hospital floor or unit, greater than 50% of which was counseling/coordinating care s/p BRTO

## 2023-08-08 NOTE — Plan of Care (Signed)
   Problem: Tissue Perfusion: Goal: Adequacy of tissue perfusion will improve Outcome: Progressing

## 2023-08-08 NOTE — Progress Notes (Signed)
 PHARMACY - ANTICOAGULATION CONSULT NOTE  Pharmacy Consult for heparin  Indication: VTE treatment   No Known Allergies  Patient Measurements: Height: 5' 5.98 (167.6 cm) Weight: 86.7 kg (191 lb 2.2 oz) IBW/kg (Calculated) : 59.26 Heparin  Dosing Weight: 78 kg   Vital Signs: Temp: 98.6 F (37 C) (01/01 1630) Temp Source: Oral (01/01 1630) BP: 130/64 (01/01 1630)  Labs: Recent Labs    08/06/23 0450 08/07/23 0500 08/08/23 0402 08/08/23 1701  HGB 9.3* 9.0* 8.9*  --   HCT 28.3* 27.6* 27.5*  --   PLT 116* 117* 114*  --   HEPARINUNFRC  --   --   --  0.21*  CREATININE 0.69  --   --   --     Estimated Creatinine Clearance: 74.7 mL/min (by C-G formula based on SCr of 0.69 mg/dL).   Medical History: Past Medical History:  Diagnosis Date   Arthritis    bilateral knees   Diabetes mellitus without complication (HCC)    GI bleed 07/31/2023    Medications:  Scheduled:   sodium chloride    Intravenous Once   sodium chloride    Intravenous Once   Chlorhexidine  Gluconate Cloth  6 each Topical Daily   insulin  aspart  0-15 Units Subcutaneous TID WC   insulin  glargine-yfgn  14 Units Subcutaneous BID   mupirocin  ointment   Nasal BID   pantoprazole  (PROTONIX ) IV  40 mg Intravenous Q12H   Infusions:   heparin  1,300 Units/hr (08/08/23 1050)   promethazine  (PHENERGAN ) injection (IM or IVPB) Stopped (07/31/23 0856)   sodium chloride      PRN: acetaminophen , dextrose , Muscle Rub, ondansetron  (ZOFRAN ) IV, mouth rinse, polyethylene glycol, promethazine  (PHENERGAN ) injection (IM or IVPB), sodium chloride   Assessment: 68 YO F with no past medical history presenting to the ED with a stroke (most likely embolic) and now resolved variceal bleed s/p BRTO. CT angiogram from 12/31 showed new short-segment focus of eccentric nonocclusive mural thrombus in the SMV. Patient was not on any anticoagulants PTA. Pharmacy consulted to start heparin  with clearance from IM and IR.   6hr heparin  level  subtherapeutic (0.21) on 1300 units/hr. No issues with heparin  infusion or s/sx of bleeding per RN.  Goal of Therapy:  Heparin  level 0.3-0.5 units/ml Monitor platelets by anticoagulation protocol: Yes   Plan:  Increase heparin  infusion to 1400 units/hr Check anti-Xa level in 6 hours and daily while on heparin  Continue to monitor H&H and platelets  Harlene Mandril, PharmD PGY1 Pharmacy Resident 08/08/2023 6:05 PM

## 2023-08-08 NOTE — Progress Notes (Signed)
 Occupational Therapy Treatment Patient Details Name: Maria Rose MRN: [id number] DOB: 1956-04-29 Today's Date: 08/08/2023   History of present illness 68 y.o. female presents to [facility] hospital on 07/30/2023 after a fall and shaking episode. 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 Additional punctate acute infarcts in right cerebellum, left temporal lobe, left occipital lobe, right occipital lobe, right parietal lobe, right frontal lobe, right thalamus, left frontal lobe. Pt with bloody bowel movement and hematemesis on 12/24. PMH includes hepatic cirrhosis, gastric and splenic varices, sepsis.   OT comments  Patient making good gains with OT treatment. Patient able to get to EOB with supervision and min assist to donn socks. Patient able to donn gown for back and stood at sink for grooming tasks with supervision . Patient performed mobility in hallway and was able to identify items on left. Patient will benefit from intensive inpatient follow up therapy, >3 hours/day to increase independence and safety with self care. Acute OT to continue to follow.       If plan is discharge home, recommend the following:  A little help with walking and/or transfers;Assistance with cooking/housework;Assist for transportation;Help with stairs or ramp for entrance;A little help with bathing/dressing/bathroom   Equipment Recommendations  BSC/3in1    Recommendations for Other Services      Precautions / Restrictions Precautions Precautions: Fall;Other (comment) Precaution Comments: L visual field deficits Restrictions Weight Bearing Restrictions Per Provider Order: No       Mobility Bed Mobility Overal bed mobility: Needs Assistance Bed Mobility: Supine to Sit, Sit to Supine     Supine to sit: HOB elevated, Used rails, Supervision Sit to supine: Supervision        Transfers Overall transfer  level: Needs assistance Equipment used: Rolling walker (2 wheels) Transfers: Sit to/from Stand Sit to Stand: Min assist           General transfer comment: min assist to power up and CGA for transfers     Balance Overall balance assessment: Needs assistance Sitting-balance support: No upper extremity supported, Feet supported Sitting balance-Leahy Scale: Good Sitting balance - Comments: able to donn socks while seated on EOB   Standing balance support: Reliant on assistive device for balance, During functional activity, No upper extremity supported Standing balance-Leahy Scale: Fair Standing balance comment: RW for support with dynamic standing able to stand at sink with no UE support                           ADL either performed or assessed with clinical judgement   ADL Overall ADL's : Needs assistance/impaired     Grooming: Supervision/safety;Standing           Upper Body Dressing : Set up;Sitting Upper Body Dressing Details (indicate cue type and reason): gown for back Lower Body Dressing: Minimal assistance;Sitting/lateral leans Lower Body Dressing Details (indicate cue type and reason): to donn socks               General ADL Comments: making good gains with self care    Extremity/Trunk Assessment              Vision       Perception     Praxis      Cognition Arousal: Alert Behavior During Therapy: WFL for tasks assessed/performed Overall Cognitive Status: History of cognitive impairments - at baseline  General Comments: Pleasant and eager to participate        Exercises      Shoulder Instructions       General Comments VSS on RA    Pertinent Vitals/ Pain       Pain Assessment Pain Assessment: Faces Faces Pain Scale: No hurt Pain Intervention(s): Monitored during session  Home Living                                          Prior  Functioning/Environment              Frequency  Min 1X/week        Progress Toward Goals  OT Goals(current goals can now be found in the care plan section)  Progress towards OT goals: Progressing toward goals  Acute Rehab OT Goals OT Goal Formulation: With patient Time For Goal Achievement: 08/16/23 Potential to Achieve Goals: Good ADL Goals Pt Will Perform Grooming: with modified independence;standing Pt Will Perform Lower Body Bathing: with modified independence;with supervision Pt Will Perform Lower Body Dressing: with supervision;sit to/from stand Pt Will Transfer to Toilet: with supervision;ambulating;regular height toilet Pt Will Perform Toileting - Clothing Manipulation and hygiene: with supervision;sit to/from stand Additional ADL Goal #1: Pt will compensate for L visual field deficit with minimal verbal cues.  Plan      Co-evaluation                 AM-PAC OT 6 Clicks Daily Activity     Outcome Measure   Help from another person eating meals?: None Help from another person taking care of personal grooming?: A Little Help from another person toileting, which includes using toliet, bedpan, or urinal?: A Little Help from another person bathing (including washing, rinsing, drying)?: A Lot Help from another person to put on and taking off regular upper body clothing?: A Little Help from another person to put on and taking off regular lower body clothing?: A Lot 6 Click Score: 17    End of Session Equipment Utilized During Treatment: Rolling walker (2 wheels);Gait belt  OT Visit Diagnosis: Unsteadiness on feet (R26.81);Other abnormalities of gait and mobility (R26.89);Muscle weakness (generalized) (M62.81);Other symptoms and signs involving cognitive function   Activity Tolerance Patient tolerated treatment well   Patient Left in bed;with call bell/phone within reach   Nurse Communication Mobility status        Time: 9195-9178 OT Time  Calculation (min): 17 min  Charges: OT General Charges $OT Visit: 1 Visit OT Treatments $Self Care/Home Management : 8-22 mins  Maria Rose, OTA Acute Rehabilitation Services  Office [phone]   Maria Rose 08/08/2023, 11:10 AM

## 2023-08-09 DIAGNOSIS — I639 Cerebral infarction, unspecified: Secondary | ICD-10-CM

## 2023-08-09 LAB — BASIC METABOLIC PANEL
Anion gap: 11 (ref 5–15)
BUN: 10 mg/dL (ref 8–23)
CO2: 23 mmol/L (ref 22–32)
Calcium: 9.1 mg/dL (ref 8.9–10.3)
Chloride: 102 mmol/L (ref 98–111)
Creatinine, Ser: 0.61 mg/dL (ref 0.44–1.00)
GFR, Estimated: >60 mL/min (ref >60)
Glucose, Bld: 223 mg/dL — ABNORMAL HIGH (ref 70–99)
Potassium: 3.6 mmol/L (ref 3.5–5.1)
Sodium: 136 mmol/L (ref 135–145)

## 2023-08-09 LAB — GLUCOSE, CAPILLARY
Glucose-Capillary: 167 mg/dL — ABNORMAL HIGH (ref 70–99)
Glucose-Capillary: 184 mg/dL — ABNORMAL HIGH (ref 70–99)
Glucose-Capillary: 210 mg/dL — ABNORMAL HIGH (ref 70–99)

## 2023-08-09 LAB — BPAM RBC
Blood Product Expiration Date: 202501052359
Blood Product Expiration Date: 202501112359
Blood Product Expiration Date: 202501132359
Blood Product Expiration Date: 202501152359
ISSUE DATE / TIME: 202412291159
ISSUE DATE / TIME: 202412291159
Unit Type and Rh: 9500
Unit Type and Rh: 9500
Unit Type and Rh: 9500
Unit Type and Rh: 9500

## 2023-08-09 LAB — HEPARIN LEVEL (UNFRACTIONATED)
Heparin Unfractionated: 0.34 [IU]/mL (ref 0.30–0.70)
Heparin Unfractionated: 0.36 [IU]/mL (ref 0.30–0.70)

## 2023-08-09 LAB — TYPE AND SCREEN
ABO/RH(D): O NEG
Antibody Screen: NEGATIVE
Unit division: 0
Unit division: 0
Unit division: 0
Unit division: 0

## 2023-08-09 LAB — CBC
HCT: 32.6 % — ABNORMAL LOW (ref 36.0–46.0)
Hemoglobin: 10.4 g/dL — ABNORMAL LOW (ref 12.0–15.0)
MCH: 24.4 pg — ABNORMAL LOW (ref 26.0–34.0)
MCHC: 31.9 g/dL (ref 30.0–36.0)
MCV: 76.5 fL — ABNORMAL LOW (ref 80.0–100.0)
Platelets: 136 10*3/uL — ABNORMAL LOW (ref 150–400)
RBC: 4.26 MIL/uL (ref 3.87–5.11)
RDW: 25.6 % — ABNORMAL HIGH (ref 11.5–15.5)
WBC: 8.6 10*3/uL (ref 4.0–10.5)
nRBC: 0 % (ref 0.0–0.2)

## 2023-08-09 MED ORDER — INSULIN GLARGINE-YFGN 100 UNIT/ML ~~LOC~~ SOLN
15.0000 [IU] | Freq: Two times a day (BID) | SUBCUTANEOUS | Status: DC
  Administered 2023-08-09 – 2023-08-12 (×6): 15 [IU] via SUBCUTANEOUS
  Filled 2023-08-09 (×8): qty 0.15

## 2023-08-09 MED ORDER — MELATONIN 3 MG PO TABS
3.0000 mg | ORAL_TABLET | Freq: Every evening | ORAL | Status: DC | PRN
  Administered 2023-08-09 – 2023-08-10 (×2): 3 mg via ORAL
  Filled 2023-08-09 (×2): qty 1

## 2023-08-09 NOTE — Progress Notes (Signed)
 Physical Therapy Treatment Patient Details Name: Maria Rose MRN: [id number] DOB: [birth year] Today's Date: 08/09/2023   History of Present Illness 68 y.o. female presents to [facility] hospital on 07/30/2023 after a fall and shaking episode. 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 Additional punctate acute infarcts in right cerebellum, left temporal lobe, left occipital lobe, right occipital lobe, right parietal lobe, right frontal lobe, right thalamus, left frontal lobe. Pt with bloody bowel movement and hematemesis on 12/24. PMH includes hepatic cirrhosis, gastric and splenic varices, sepsis.    PT Comments  Pt tearful this date about medical conditions and the functional set back but remains motivated to complete rehab and return to independence. Pt with L visual field deficits, impaired balance at noted by 18/24 on DGI indicating moderate falls risk, decreased activity tolerance benefiting from use of AD to provide energy conservation to progress ambulation tolerance to return to community mobility. Pt to continue to benefit from aggressive inpatient rehab program > 3 hrs a day to address deficits and achieve safe mod I level of independence. Pt demonstrates excellent rehab potential. Acute PT to cont to follow.    If plan is discharge home, recommend the following: A lot of help with bathing/dressing/bathroom;Assistance with cooking/housework;Direct supervision/assist for medications management;Direct supervision/assist for financial management;Assist for transportation;Help with stairs or ramp for entrance;A little help with walking and/or transfers   Can travel by private vehicle        Equipment Recommendations   (TBD at next venue)    Recommendations for Other Services Rehab consult     Precautions / Restrictions Precautions Precautions: Fall;Other (comment) Precaution Comments: L  visual field deficits Restrictions Weight Bearing Restrictions Per Provider Order: No     Mobility  Bed Mobility Overal bed mobility: Needs Assistance Bed Mobility: Supine to Sit, Sit to Supine     Supine to sit: HOB elevated, Used rails, Supervision Sit to supine: Supervision   General bed mobility comments: HOB elevated, use of bed rails    Transfers Overall transfer level: Needs assistance   Transfers: Sit to/from Stand Sit to Stand: Min assist           General transfer comment: min assist to power up and steady, wide base of support    Ambulation/Gait Ambulation/Gait assistance: Contact guard assist Gait Distance (Feet): 250 Feet Assistive device: None, IV Pole Gait Pattern/deviations: Step-through pattern, Decreased stride length, Drifts right/left, Wide base of support, Antalgic Gait velocity: decreased Gait velocity interpretation: 1.31 - 2.62 ft/sec, indicative of limited community ambulator   General Gait Details: pt with antalgic gait with noted limp on L side. Pt with noted SOB, SPO2 at 90% on RA, pt given IV pole to push s/p 100' and pt with significant improvement in stability, SOB, and step height and length. pt reporting oh that was much easier   Stairs Stairs: Yes Stairs assistance: Min assist Stair Management: One rail Right, Step to pattern Number of Stairs: 4 General stair comments: minA to steady on descending stairs due to poor ecentric control   Wheelchair Mobility     Tilt Bed    Modified Rankin (Stroke Patients Only) Modified Rankin (Stroke Patients Only) Pre-Morbid Rankin Score: No significant disability Modified Rankin: Moderate disability     Balance Overall balance assessment: Needs assistance Sitting-balance support: No upper extremity supported, Feet supported Sitting balance-Leahy Scale: Good Sitting balance - Comments: able to donn socks while seated on EOB  Standing balance support: Reliant on assistive device for  balance, During functional activity, No upper extremity supported Standing balance-Leahy Scale: Fair Standing balance comment: able to stand at sink to wash hands however leaned on sink with trunk                 Standardized Balance Assessment Standardized Balance Assessment : Dynamic Gait Index   Dynamic Gait Index Level Surface: Mild Impairment Change in Gait Speed: Mild Impairment Gait with Horizontal Head Turns: Normal Gait with Vertical Head Turns: Normal Gait and Pivot Turn: Mild Impairment Step Over Obstacle: Mild Impairment Step Around Obstacles: Mild Impairment Steps: Mild Impairment Total Score: 18      Cognition Arousal: Alert Behavior During Therapy: WFL for tasks assessed/performed Overall Cognitive Status: Impaired/Different from baseline Area of Impairment: Following commands (.)                       Following Commands:  (following commands requires repetition at times)   Awareness: Emergent Problem Solving: Slow processing, Difficulty sequencing, Requires verbal cues General Comments: pt A&Ox4 this date, emotional/tearful regarding the multiple strokes, blood clot, and bleeding in liver.        Exercises      General Comments General comments (skin integrity, edema, etc.): SpO2 at 90% on RA during amb      Pertinent Vitals/Pain Pain Assessment Pain Assessment: No/denies pain    Home Living                          Prior Function            PT Goals (current goals can now be found in the care plan section) Acute Rehab PT Goals Patient Stated Goal: to return to independence PT Goal Formulation: With patient Time For Goal Achievement: 08/14/23 Potential to Achieve Goals: Fair Progress towards PT goals: Progressing toward goals    Frequency    Min 1X/week      PT Plan      Co-evaluation              AM-PAC PT 6 Clicks Mobility   Outcome Measure  Help needed turning from your back to your side  while in a flat bed without using bedrails?: A Little Help needed moving from lying on your back to sitting on the side of a flat bed without using bedrails?: A Little Help needed moving to and from a bed to a chair (including a wheelchair)?: A Little Help needed standing up from a chair using your arms (e.g., wheelchair or bedside chair)?: A Little Help needed to walk in hospital room?: A Little Help needed climbing 3-5 steps with a railing? : A Lot 6 Click Score: 17    End of Session Equipment Utilized During Treatment: Gait belt Activity Tolerance: Patient tolerated treatment well Patient left: in bed;with call bell/phone within reach Nurse Communication: Mobility status PT Visit Diagnosis: Other abnormalities of gait and mobility (R26.89);Muscle weakness (generalized) (M62.81)     Time: 9191-9167 PT Time Calculation (min) (ACUTE ONLY): 24 min  Charges:    $Gait Training: 8-22 mins $Neuromuscular Re-education: 8-22 mins PT General Charges $$ ACUTE PT VISIT: 1 Visit                     Norene Ames, PT, DPT Acute Rehabilitation Services Secure chat preferred Office #: [phone]    Norene CHRISTELLA Ames 08/09/2023, 9:07 AM

## 2023-08-09 NOTE — Progress Notes (Signed)
 IP rehab admissions - We continue to await insurance call back regarding potential acute inpatient rehab admission.  Will update all once I hear back from insurance carrier.  Call for questions.  [phone]

## 2023-08-09 NOTE — Progress Notes (Signed)
 HD#9 SUBJECTIVE:  Patient Summary: Maria Rose is a 68 YO F with no past medical history presenting to the ED with a three day history of headache and syncopal episode and admitted for stroke workup.  Overnight Events: None  Interim History: Patient was evaluated bedside. States she is doing well. Denies any symptoms currently. Still has not had a bowel movement despite Miralax . No abdominal pain, bleeding reported.   OBJECTIVE:  Vital Signs: Vitals:   08/08/23 1630 08/08/23 2020 08/08/23 2355 08/09/23 0359  BP: 130/64 (!) 127/55 (!) 123/58 131/61  Pulse:  83 75 79  Resp:  17 18 17   Temp: 98.6 F (37 C) 98.5 F (36.9 C) 98.5 F (36.9 C) 97.7 F (36.5 C)  TempSrc: Oral Oral Oral Oral  SpO2:  97% 98% 96%  Weight:      Height:       Supplemental O2: Room Air  Filed Weights   07/30/23 1132 08/01/23 0430 08/05/23 0955  Weight: 81.6 kg 86.7 kg 86.7 kg    Intake/Output Summary (Last 24 hours) at 08/09/2023 1239 Last data filed at 08/08/2023 2315 Gross per 24 hour  Intake 294.17 ml  Output --  Net 294.17 ml   Net IO Since Admission: 8,883.76 mL [08/09/23 1239]  CBC    Component Value Date/Time   WBC 8.6 08/09/2023 1031   RBC 4.26 08/09/2023 1031   HGB 10.4 (L) 08/09/2023 1031   HCT 32.6 (L) 08/09/2023 1031   PLT 136 (L) 08/09/2023 1031   MCV 76.5 (L) 08/09/2023 1031   MCH 24.4 (L) 08/09/2023 1031   MCHC 31.9 08/09/2023 1031   RDW 25.6 (H) 08/09/2023 1031   LYMPHSABS 3.9 07/31/2023 0222   MONOABS 0.9 07/31/2023 0222   EOSABS 0.0 07/31/2023 0222   BASOSABS 0.0 07/31/2023 0222   Physical Exam: Physical Exam Constitutional:      Appearance: Normal appearance.  HENT:     Head: Normocephalic and atraumatic.  Cardiovascular:     Rate and Rhythm: Normal rate and regular rhythm.     Pulses: Normal pulses.     Heart sounds: Normal heart sounds.  Pulmonary:     Effort: Pulmonary effort is normal.     Breath sounds: Normal breath sounds.  Abdominal:     General:  Abdomen is flat.     Palpations: Abdomen is soft.     Tenderness: There is no abdominal tenderness.  Musculoskeletal:        General: Normal range of motion.  Neurological:     Mental Status: She is alert and oriented to person, place, and time.    ASSESSMENT/PLAN:  Assessment: Principal Problem:   Cerebrovascular accident (CVA) (HCC) Active Problems:   Hyperglycemia   Decompensated cirrhosis (HCC)   Gastric varices   Splenic varices   Splenic infarct   Bleeding esophageal varices (HCC)   Mesenteric vein thrombosis (HCC)  Maria Rose is a 68 YO F with no past medical history presenting to the ED with a three day history of headache and syncopal episode and admitted for stroke workup and new onset decompensated cirrhosis.  Plan: Acute SMV thrombus New non-occlusive mural thrombus noted on post-BRTO CT angio on 1/1. Continues to be asymptomatic. No active bleeding. Heparin  now at a therapeutic dose.  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 Platelets stable at 136. No new bleeding or abdominal symptoms reported this morning. No ascites appreciated. IR following. Potentially may need TIPS procedure.  - IR following; appreciate recs - Outpatient iron  repletion - Continue CM diet  Stroke, likely embolic No neurologic deficits on exam. Doing well on heparin  so far. She will likely need at least 3 months of anticoagulation at discharge given her new VTE. Neurology previously recommended ASA 81 mg daily at discharge; will touch base with them once her acute problems are treated about sending her home with a DOAC.  - Ambulatory cardiac monitoring for 30 days at discharge - Outpatient f/u with stroke clinic at [facility] in 4 weeks - Outpatient f/u with  cardiology  T2DM Hemoglobin A1c 11.4. Fasting blood sugar 167 this morning, still elevated above goal, so we will keep titrating her long-acting insulin .  - Diabetes education consult - Increased Semglee  to 15 units BID; continue SSI - Trend CBG  Chronic interstitial lung disease, probable Usual interstitial pneumonia Unclear etiology. Small lung nodule seen on imaging. Will need outpatient pulmonology follow up.   Best Practice: Diet: CM diet VTE: IV heparin  Code: Full AB: None Therapy Recs: CIR DISPO: Anticipated discharge  2-4 days  to  CIR  pending  VTE treatment and CIR insurance approval .  Signature: Maria Riff, MD Internal Medicine Resident, PGY-1 [facility]  Pager: [phone]  Please contact the on call pager after 5 pm and on weekends at [phone].

## 2023-08-09 NOTE — Plan of Care (Signed)
  Problem: Skin Integrity: Goal: Risk for impaired skin integrity will decrease Outcome: Progressing   

## 2023-08-09 NOTE — Inpatient Diabetes Management (Signed)
 Inpatient Diabetes Program Recommendations  AACE/ADA: New Consensus Statement on Inpatient Glycemic Control (2015)  Target Ranges:  Prepandial:   less than 140 mg/dL      Peak postprandial:   less than 180 mg/dL (1-2 hours)      Critically ill patients:  140 - 180 mg/dL   Lab Results  Component Value Date   GLUCAP 167 (H) 08/09/2023   HGBA1C 11.4 (H) 07/30/2023    Spoke with pt at bedside to review diabetes education and review the insulin  pen operation with pt. Reviewed lifestyle modifications with dietary choices. Showed pt how to operate the insulin  pen. PT states she maybe going to rehab. I told pt we can follow her while she is there and to review information closer to the time she will be discharged.  Thanks,  Clotilda Bull RN, MSN, BC-ADM Inpatient Diabetes Coordinator Team Pager [phone] (8a-5p)

## 2023-08-09 NOTE — Progress Notes (Addendum)
 PHARMACY - ANTICOAGULATION CONSULT NOTE  Pharmacy Consult for continuous infusion of unfractionated heparin , weight-based, heparin  level guided.  Indication:  Superior mesenteric vein thrombosis.  No Known Allergies  Patient Measurements: Height: 5' 5.98 (167.6 cm) Weight: 86.7 kg (191 lb 2.2 oz) IBW/kg (Calculated) : 59.26 Heparin  Dosing Weight: 78 Kg  Vital Signs: Temp: 97.7 F (36.5 C) (01/02 0359) Temp Source: Oral (01/02 0359) BP: 131/61 (01/02 0359) Pulse Rate: 79 (01/02 0359)  Labs: Recent Labs    08/07/23 0500 08/08/23 0402 08/08/23 1701 08/09/23 0006 08/09/23 1031  HGB 9.0* 8.9*  --   --  10.4*  HCT 27.6* 27.5*  --   --  32.6*  PLT 117* 114*  --   --  136*  HEPARINUNFRC  --   --  0.21* 0.34 0.36  CREATININE  --   --   --   --  0.61    Estimated Creatinine Clearance: 74.7 mL/min (by C-G formula based on SCr of 0.61 mg/dL).   Medical History: Past Medical History:  Diagnosis Date   Arthritis    bilateral knees   Diabetes mellitus without complication (HCC)    GI bleed 07/31/2023    Medications:  Scheduled:   sodium chloride    Intravenous Once   sodium chloride    Intravenous Once   Chlorhexidine  Gluconate Cloth  6 each Topical Daily   insulin  aspart  0-15 Units Subcutaneous TID WC   insulin  glargine-yfgn  15 Units Subcutaneous BID   mupirocin  ointment   Nasal BID   pantoprazole  (PROTONIX ) IV  40 mg Intravenous Q12H    Assessment: CIUFH heparin  level scheduled for 0700 today not collected until 1031 and not resulted until 1430h. Result:  0.36 units/mL (therapeutic).  Currently infusing at 1400 units/hr. No bleeding endorsed.  Goal of Therapy:  Heparin  level 0.3-0.7 units/ml Monitor platelets by anticoagulation protocol: Yes   Plan:  Continue at 1400 units/hr. Check heparin  level daily. Next scheduled level is for tomorrow 0500 08/10/23.  Lynwood KATHEE Lites, PharmD, CPP 08/09/2023,2:30 PM

## 2023-08-09 NOTE — Progress Notes (Signed)
 PHARMACY - ANTICOAGULATION CONSULT NOTE  Pharmacy Consult for heparin  Indication: VTE treatment   No Known Allergies  Patient Measurements: Height: 5' 5.98 (167.6 cm) Weight: 86.7 kg (191 lb 2.2 oz) IBW/kg (Calculated) : 59.26 Heparin  Dosing Weight: 78 kg   Vital Signs: Temp: 98.5 F (36.9 C) (01/01 2355) Temp Source: Oral (01/01 2355) BP: 123/58 (01/01 2355) Pulse Rate: 75 (01/01 2355)  Labs: Recent Labs    08/06/23 0450 08/07/23 0500 08/08/23 0402 08/08/23 1701 08/09/23 0006  HGB 9.3* 9.0* 8.9*  --   --   HCT 28.3* 27.6* 27.5*  --   --   PLT 116* 117* 114*  --   --   HEPARINUNFRC  --   --   --  0.21* 0.34  CREATININE 0.69  --   --   --   --     Estimated Creatinine Clearance: 74.7 mL/min (by C-G formula based on SCr of 0.69 mg/dL).   Medical History: Past Medical History:  Diagnosis Date   Arthritis    bilateral knees   Diabetes mellitus without complication (HCC)    GI bleed 07/31/2023    Medications:  Scheduled:   sodium chloride    Intravenous Once   sodium chloride    Intravenous Once   Chlorhexidine  Gluconate Cloth  6 each Topical Daily   insulin  aspart  0-15 Units Subcutaneous TID WC   insulin  glargine-yfgn  14 Units Subcutaneous BID   mupirocin  ointment   Nasal BID   pantoprazole  (PROTONIX ) IV  40 mg Intravenous Q12H   Infusions:   heparin  1,400 Units/hr (08/08/23 1925)   promethazine  (PHENERGAN ) injection (IM or IVPB) Stopped (07/31/23 0856)   sodium chloride      PRN: acetaminophen , dextrose , Muscle Rub, ondansetron  (ZOFRAN ) IV, mouth rinse, polyethylene glycol, promethazine  (PHENERGAN ) injection (IM or IVPB), sodium chloride   Assessment: 68 YO F with no past medical history presenting to the ED with a stroke (most likely embolic) and now resolved variceal bleed s/p BRTO. CT angiogram from 12/31 showed new short-segment focus of eccentric nonocclusive mural thrombus in the SMV. Patient was not on any anticoagulants PTA. Pharmacy consulted to  start heparin  with clearance from IM and IR.   6hr heparin  level therapeutic (0.34) on 1500 units/hr. No issues with heparin  infusion or s/sx of bleeding per RN.  Goal of Therapy:  Heparin  level 0.3-0.5 units/ml Monitor platelets by anticoagulation protocol: Yes   Plan:  Continue heparin  infusion to 1400 units/hr Check confirmatory anti-Xa level in 6 hours and daily while on heparin  Continue to monitor H&H and platelets  Lynwood Poplar, PharmD. Clinical Pharmacist 08/09/2023 12:30 AM

## 2023-08-10 ENCOUNTER — Other Ambulatory Visit (HOSPITAL_COMMUNITY): Payer: Self-pay | Admitting: Radiology

## 2023-08-10 ENCOUNTER — Encounter (HOSPITAL_COMMUNITY): Payer: Self-pay | Admitting: Infectious Diseases

## 2023-08-10 DIAGNOSIS — I8511 Secondary esophageal varices with bleeding: Secondary | ICD-10-CM

## 2023-08-10 DIAGNOSIS — K55069 Acute infarction of intestine, part and extent unspecified: Secondary | ICD-10-CM

## 2023-08-10 DIAGNOSIS — I864 Gastric varices: Secondary | ICD-10-CM

## 2023-08-10 LAB — CBC
HCT: 29.9 % — ABNORMAL LOW (ref 36.0–46.0)
Hemoglobin: 9.7 g/dL — ABNORMAL LOW (ref 12.0–15.0)
MCH: 24.7 pg — ABNORMAL LOW (ref 26.0–34.0)
MCHC: 32.4 g/dL (ref 30.0–36.0)
MCV: 76.1 fL — ABNORMAL LOW (ref 80.0–100.0)
Platelets: 133 10*3/uL — ABNORMAL LOW (ref 150–400)
RBC: 3.93 MIL/uL (ref 3.87–5.11)
RDW: 24.9 % — ABNORMAL HIGH (ref 11.5–15.5)
WBC: 8.5 10*3/uL (ref 4.0–10.5)
nRBC: 0 % (ref 0.0–0.2)

## 2023-08-10 LAB — HEPARIN LEVEL (UNFRACTIONATED): Heparin Unfractionated: 0.38 [IU]/mL (ref 0.30–0.70)

## 2023-08-10 LAB — GLUCOSE, CAPILLARY
Glucose-Capillary: 152 mg/dL — ABNORMAL HIGH (ref 70–99)
Glucose-Capillary: 155 mg/dL — ABNORMAL HIGH (ref 70–99)
Glucose-Capillary: 233 mg/dL — ABNORMAL HIGH (ref 70–99)
Glucose-Capillary: 278 mg/dL — ABNORMAL HIGH (ref 70–99)

## 2023-08-10 MED ORDER — CARVEDILOL 3.125 MG PO TABS
3.1250 mg | ORAL_TABLET | Freq: Two times a day (BID) | ORAL | Status: DC
  Administered 2023-08-10 – 2023-08-12 (×4): 3.125 mg via ORAL
  Filled 2023-08-10 (×4): qty 1

## 2023-08-10 NOTE — Progress Notes (Signed)
 Supervising Physician: Luverne Aran  Patient Status:  [facility] - In-pt  Chief Complaint:  Gastric varices S/p BRTO on 12.29.24 with IR Attending Dr. Karalee  Subjective:  Husband and son at bedside. Patient states that she is hoping to be discharged home tomorrow. Plans for Patient to be bridge from Heparin  to PO anticoagulation.   Allergies: Patient has no known allergies.  Medications: Prior to Admission medications   Medication Sig Start Date End Date Taking? Authorizing Provider  clotrimazole  (LOTRIMIN ) 1 % cream Apply to affected area 2 times daily 05/16/23  Yes Geiple, Joshua, PA-C  erythromycin ophthalmic ointment Place 1 Application into the left eye 4 (four) times daily. 06/03/23  Yes [provider]  mupirocin  ointment (BACTROBAN ) 2 % Apply 1 Application topically 2 (two) times daily. 06/26/23  Yes [provider]     Vital Signs: BP (!) 114/48 (BP Location: Left Arm)   Pulse 82   Temp 98 F (36.7 C) (Oral)   Resp 17   Ht 5' 5.98 (1.676 m)   Wt 191 lb 2.2 oz (86.7 kg)   SpO2 99%   BMI 30.87 kg/m   Physical Exam Vitals and nursing note reviewed.  Constitutional:      Appearance: She is well-developed.  HENT:     Head: Normocephalic and atraumatic.  Eyes:     Conjunctiva/sclera: Conjunctivae normal.  Pulmonary:     Effort: Pulmonary effort is normal.  Musculoskeletal:        General: Normal range of motion.     Cervical back: Normal range of motion.  Skin:    General: Skin is warm.  Neurological:     General: No focal deficit present.     Mental Status: She is alert and oriented to person, place, and time. Mental status is at baseline.  Psychiatric:        Mood and Affect: Mood normal.        Behavior: Behavior normal.        Thought Content: Thought content normal.        Judgment: Judgment normal.     Imaging: CT ANGIO ABD/PELVIS BRTO Result Date: 08/08/2023 CLINICAL DATA:  Gastric varices, post BRTO EXAM: CTA ABDOMEN  AND PELVIS WITHOUT AND WITH CONTRAST TECHNIQUE: Multidetector CT imaging of the abdomen and pelvis was performed using the standard protocol during bolus administration of intravenous contrast. Multiplanar reconstructed images and MIPs were obtained and reviewed to evaluate the vascular anatomy. RADIATION DOSE REDUCTION: This exam was performed according to the departmental dose-optimization program which includes automated exposure control, adjustment of the mA and/or kV according to patient size and/or use of iterative reconstruction technique. CONTRAST:  OMNIPAQUE  IOHEXOL  350 MG/ML SOLN COMPARISON:  08/01/2023 FINDINGS: VASCULAR Aorta: Moderate partially calcified atheromatous plaque without aneurysm, dissection, or stenosis. Mural thrombus in the suprarenal segment as before, potential embolic risk. Celiac: Patent without evidence of aneurysm, dissection, vasculitis or significant stenosis. SMA: Patent without evidence of aneurysm, dissection, vasculitis or significant stenosis. Renals: Both renal arteries are patent without evidence of aneurysm, dissection, vasculitis, fibromuscular dysplasia or significant stenosis. IMA: Patent without evidence of aneurysm, dissection, vasculitis or significant stenosis. Inflow: Scattered nonocclusive partially calcified plaque in bilateral common and internal iliac arteries. Mild eccentric plaque in the mid left external iliac artery with only mild stenosis. Proximal Outflow: Mildly atheromatous, patent. Veins: Residual embolic material in gastric varices and splenorenal shunt, consistent with successful occlusion. Short-segment focus of eccentric nonocclusive mural thrombus in the SMV, new since  previous. No embolic material in the portal venous system or liver. Patent hepatic veins, portal vein, splenic vein, IMV, bilateral renal veins. Incomplete opacification of iliac venous system and IVC attributed to scan timing. Review of the MIP images confirms the above  findings. NON-VASCULAR Lower chest: No pleural or pericardial effusion. Chronic interstitial changes in the visualized lung bases. Hepatobiliary: Mild nodular contour. No focal lesion or biliary ductal dilatation. Gallbladder unremarkable. Pancreas: Unremarkable. No pancreatic ductal dilatation or surrounding inflammatory changes. Spleen: Stable mild splenomegaly without focal lesion. Adrenals/Urinary Tract: No adrenal mass. Symmetric renal parenchymal enhancement without focal lesion, urolithiasis, or hydronephrosis. Urinary bladder partially distended, containing small gas bubble presumably from recent instrumentation. Stomach/Bowel: Stomach is nondistended with embolic material and previously demonstrated gastric varices. Small bowel decompressed. Normal appendix. Colon partially distended with scattered descending and sigmoid diverticula; no adjacent inflammatory change. Lymphatic: No abdominal or pelvic adenopathy. Reproductive: Uterus and bilateral adnexa are unremarkable. Other: No ascites.  No free air. Musculoskeletal: Mild lumbar levoscoliosis apex L3 with multilevel spondylitic change. 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 IMPRESSION: 1. Successful occlusion of gastric varices and splenorenal shunt. 2. Short-segment focus of eccentric nonocclusive mural thrombus in the SMV, new since previous. 3.   Colonic diverticulosis. 4.  Aortic Atherosclerosis (ICD10-I70.0). Electronically Signed   By: JONETTA Faes M.D.   On: 08/08/2023 08:03    Labs:  CBC: Recent Labs    08/07/23 0500 08/08/23 0402 08/09/23 1031 08/10/23 0423  WBC 8.1 6.9 8.6 8.5  HGB 9.0* 8.9* 10.4* 9.7*  HCT 27.6* 27.5* 32.6* 29.9*  PLT 117* 114* 136* 133*    COAGS: Recent Labs    07/31/23 0222 08/05/23 0420  INR 1.4* 1.0    BMP: Recent Labs    08/04/23 0305 08/05/23 0420 08/06/23 0450 08/09/23 1031  NA 139 136 136 136  K 3.4* 3.8 3.7 3.6  CL 104 102 102 102  CO2 25 25 23 23   GLUCOSE 101* 185*  251* 223*  BUN 7* 8 11 10   CALCIUM  8.2* 8.3* 8.6* 9.1  CREATININE 0.52 0.63 0.69 0.61  GFRNONAA >60 >60 >60 >60    LIVER FUNCTION TESTS: Recent Labs    07/30/23 1219 07/31/23 0222 08/05/23 0420 08/06/23 0450  BILITOT 0.7 0.6 0.7 0.8  AST 29 43* 28 30  ALT 24 34 34 33  ALKPHOS 59 35* 50 52  PROT 6.0* 3.9* 5.6* 6.2*  ALBUMIN  3.2* 2.2* 3.0* 3.4*    Assessment and Plan: 68 y.o. female history of DM. Presented tot the ED after syncopal episode.CT CAP showed cirrhosis with splenic and gastric varices s/p BRTO on 12.30.24 with IR Attending Dr. Salome Lent. Hospital stay complicated by thrombus in the SMV.  Hgb 9.7. Patient is currently on heparin  gtt.   PLAN: IR Follow up in 3 months time - Outpatient orders placed to facilitate  CT BRTO protocol in 3 months time - Outpatient orders placed to facilitate Recommend EGD with GI in 6 weeks time  Electronically Signed: Delon JAYSON Beagle, NP 08/10/2023, 2:23 PM   I spent a total of 15 Minutes at the the patient's bedside AND on the patient's hospital floor or unit, greater than 50% of which was counseling/coordinating care for s/p BRTO

## 2023-08-10 NOTE — Progress Notes (Signed)
 PHARMACY - ANTICOAGULATION CONSULT NOTE  Pharmacy Consult for continuous infusion of unfractionated heparin , weight-based, heparin  level guided.  Indication: Superior mesenteric vein thrombosis.   No Known Allergies  Patient Measurements: Height: 5' 5.98 (167.6 cm) Weight: 86.7 kg (191 lb 2.2 oz) IBW/kg (Calculated) : 59.26 Heparin  Dosing Weight: 78 killograms  Vital Signs: Temp: 97.7 F (36.5 C) (01/03 0446) Temp Source: Oral (01/03 0446) BP: 144/69 (01/03 0446) Pulse Rate: 87 (01/03 0446)  Labs: Recent Labs    08/08/23 0402 08/08/23 1701 08/09/23 0006 08/09/23 1031 08/10/23 0423  HGB 8.9*  --   --  10.4* 9.7*  HCT 27.5*  --   --  32.6* 29.9*  PLT 114*  --   --  136* 133*  HEPARINUNFRC  --    < > 0.34 0.36 0.38  CREATININE  --   --   --  0.61  --    < > = values in this interval not displayed.    Estimated Creatinine Clearance: 74.7 mL/min (by C-G formula based on SCr of 0.61 mg/dL).   Medical History: Past Medical History:  Diagnosis Date   Arthritis    bilateral knees   Diabetes mellitus without complication (HCC)    GI bleed 07/31/2023    Medications:  Scheduled:   sodium chloride    Intravenous Once   sodium chloride    Intravenous Once   Chlorhexidine  Gluconate Cloth  6 each Topical Daily   insulin  aspart  0-15 Units Subcutaneous TID WC   insulin  glargine-yfgn  15 Units Subcutaneous BID   mupirocin  ointment   Nasal BID   pantoprazole  (PROTONIX ) IV  40 mg Intravenous Q12H    Assessment: CIUFH infusing at 1400 units/hr. Heparin  level at 0423h 0.38 units/mL, thereapeutic within target range of 0.3 - 0.7 units/mL.  Goal of Therapy:  Heparin  level 0.3-0.7 units/ml Monitor platelets by anticoagulation protocol: Yes   Plan:  Continue to monitor H&H and platelets. Continue at same hourly infusion rate. Once all interventional procedures have been performed or are no longer necessary, may give consideration to commencing LMWH enoxaparin 1mg /kg  subcutaneously every 12 hours with concomitant warfarin overlap for requisite five day minimum overlap until such time the INR is >2.0 for 24h (target INR range 2.0 - 3.0)--OR, may consider the role of a DOAC.   Lynwood KATHEE Lites, PharmD, CPP 08/10/2023,7:58 AM

## 2023-08-10 NOTE — Plan of Care (Signed)
   Problem: Fluid Volume: Goal: Ability to maintain a balanced intake and output will improve Outcome: Progressing   Problem: Health Behavior/Discharge Planning: Goal: Ability to identify and utilize available resources and services will improve Outcome: Progressing Goal: Ability to manage health-related needs will improve Outcome: Progressing

## 2023-08-10 NOTE — Progress Notes (Signed)
 Mobility Specialist Progress Note:   08/10/23 1231  Mobility  Activity Ambulated with assistance in hallway  Level of Assistance Standby assist, set-up cues, supervision of patient - no hands on  Assistive Device  (IV Pole)  Distance Ambulated (ft) 375 ft  Activity Response Tolerated well  Mobility Referral Yes  Mobility visit 1 Mobility  Mobility Specialist Start Time (ACUTE ONLY) 1220  Mobility Specialist Stop Time (ACUTE ONLY) 1230  Mobility Specialist Time Calculation (min) (ACUTE ONLY) 10 min   Pt received EOB with family, agreeable to mobility. No physical assist needed for OOB mobility. VSS. Asx throughout. Pt returned to bed with call bell in reach and all needs met.  Maria Rose  Mobility Specialist Please contact via [employer] office at [phone]

## 2023-08-10 NOTE — Progress Notes (Signed)
 HD#10 SUBJECTIVE:  Patient Summary: Maria Rose is a 68 YO F with no past medical history presenting to the ED with a three day history of headache and syncopal episode and admitted for stroke workup.  Overnight Events: None  Interim History: Patient was evaluated bedside. Laying in bed comfortably. Had a bowel movement yesterday, which she said was dark, but no frank blood. No abdominal pain, nausea, vomiting. No new concerns.   OBJECTIVE:  Vital Signs: Vitals:   08/09/23 2339 08/10/23 0446 08/10/23 0758 08/10/23 1124  BP: 123/60 (!) 144/69 123/70 (!) 114/48  Pulse: 83 87 78 82  Resp: 15 16 17 17   Temp: 98.6 F (37 C) 97.7 F (36.5 C) 98 F (36.7 C) 98 F (36.7 C)  TempSrc: Oral Oral Oral Oral  SpO2: 97% 98% 96% 99%  Weight:      Height:       Supplemental O2: Room Air  Filed Weights   07/30/23 1132 08/01/23 0430 08/05/23 0955  Weight: 81.6 kg 86.7 kg 86.7 kg    Intake/Output Summary (Last 24 hours) at 08/10/2023 1226 Last data filed at 08/09/2023 2031 Gross per 24 hour  Intake 223.93 ml  Output --  Net 223.93 ml   Net IO Since Admission: 9,107.69 mL [08/10/23 1226]  CBC    Component Value Date/Time   WBC 8.5 08/10/2023 0423   RBC 3.93 08/10/2023 0423   HGB 9.7 (L) 08/10/2023 0423   HCT 29.9 (L) 08/10/2023 0423   PLT 133 (L) 08/10/2023 0423   MCV 76.1 (L) 08/10/2023 0423   MCH 24.7 (L) 08/10/2023 0423   MCHC 32.4 08/10/2023 0423   RDW 24.9 (H) 08/10/2023 0423   LYMPHSABS 3.9 07/31/2023 0222   MONOABS 0.9 07/31/2023 0222   EOSABS 0.0 07/31/2023 0222   BASOSABS 0.0 07/31/2023 0222   Physical Exam: Physical Exam Constitutional:      Appearance: Normal appearance.  HENT:     Head: Normocephalic and atraumatic.  Cardiovascular:     Rate and Rhythm: Normal rate and regular rhythm.     Pulses: Normal pulses.     Heart sounds: Normal heart sounds.  Pulmonary:     Effort: Pulmonary effort is normal.     Breath sounds: Normal breath sounds.  Abdominal:      General: Abdomen is flat.     Palpations: Abdomen is soft.     Tenderness: There is no abdominal tenderness.  Musculoskeletal:        General: Normal range of motion.  Neurological:     Mental Status: She is alert and oriented to person, place, and time.    ASSESSMENT/PLAN:  Assessment: Principal Problem:   Cerebrovascular accident (CVA) (HCC) Active Problems:   Hyperglycemia   Decompensated cirrhosis (HCC)   Gastric varices   Splenic varices   Splenic infarct   Bleeding esophageal varices (HCC)   Mesenteric vein thrombosis (HCC)  Maria Rose is a 68 YO F with no past medical history presenting to the ED with a three day history of headache and syncopal episode and admitted for stroke workup and new onset decompensated cirrhosis.  Plan: Acute SMV thrombus New non-occlusive mural thrombus noted on post-BRTO CT angio on 1/1. Continues to be asymptomatic. No active bleeding. Heparin  continues to be at a therapeutic dose. Will touch base with IR about final recs as she prepares to be discharged to rehab. 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 Platelets stable. No new bleeding or abdominal symptoms reported this morning. IR following, will touch base with them today about anticoagulation and TIPS procedure plan. Iron  deficit ~1395 mg.  - IR following; appreciate recs - Coreg  3.125 mg BID for variceal bleeding prophylaxis - Outpatient IV iron  repletion - Continue CM diet  Stroke, likely embolic No neurologic deficits on exam. Doing well on heparin  so far. Will work on oral anticoagulation plan for discharge to rehab. CIR evaluation pending.  - Ambulatory cardiac monitoring  for 30 days at discharge - Outpatient f/u with [facility] clinic at [facility] in 4 weeks - Outpatient f/u with cardiology  T2DM Hemoglobin A1c 11.4. Fasting blood sugar 152 this morning. 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 Small lung nodule seen on imaging. Will need outpatient pulmonology follow up.   Best Practice: Diet: CM diet VTE: IV heparin  Code: Full AB: None Therapy Recs: CIR DISPO: Anticipated discharge  1-2 days  to  CIR  pending  VTE treatment and CIR insurance approval .  Signature: Ozell Riff, MD Internal Medicine Resident, PGY-1 [facility]  Pager: [phone]  Please contact the on call pager after 5 pm and on weekends at [phone].

## 2023-08-10 NOTE — Plan of Care (Signed)
 ?  Problem: Clinical Measurements: ?Goal: Will remain free from infection ?Outcome: Progressing ?  ?

## 2023-08-10 NOTE — Plan of Care (Signed)
  Problem: Education: Goal: Ability to describe self-care measures that may prevent or decrease complications (Diabetes Survival Skills Education) will improve 08/10/2023 1726 by Christie Carlin NOVAK, RN Outcome: Progressing 08/10/2023 1725 by Christie Carlin NOVAK, RN Outcome: Progressing Goal: Individualized Educational Video(s) 08/10/2023 1726 by Christie Carlin NOVAK, RN Outcome: Progressing 08/10/2023 1725 by Christie Carlin NOVAK, RN Outcome: Progressing   Problem: Fluid Volume: Goal: Ability to maintain a balanced intake and output will improve 08/10/2023 1726 by Christie Carlin NOVAK, RN Outcome: Progressing 08/10/2023 1725 by Christie Carlin NOVAK, RN Outcome: Progressing   Problem: Health Behavior/Discharge Planning: Goal: Ability to identify and utilize available resources and services will improve 08/10/2023 1726 by Christie Carlin NOVAK, RN Outcome: Progressing 08/10/2023 1725 by Christie Carlin NOVAK, RN Outcome: Progressing Goal: Ability to manage health-related needs will improve 08/10/2023 1726 by Christie Carlin NOVAK, RN Outcome: Progressing 08/10/2023 1725 by Christie Carlin NOVAK, RN Outcome: Progressing

## 2023-08-11 ENCOUNTER — Encounter (HOSPITAL_COMMUNITY): Payer: Self-pay | Admitting: Infectious Diseases

## 2023-08-11 DIAGNOSIS — K7469 Other cirrhosis of liver: Secondary | ICD-10-CM

## 2023-08-11 DIAGNOSIS — D509 Iron deficiency anemia, unspecified: Secondary | ICD-10-CM | POA: Insufficient documentation

## 2023-08-11 LAB — GLUCOSE, CAPILLARY
Glucose-Capillary: 153 mg/dL — ABNORMAL HIGH (ref 70–99)
Glucose-Capillary: 210 mg/dL — ABNORMAL HIGH (ref 70–99)
Glucose-Capillary: 160 mg/dL — ABNORMAL HIGH (ref 70–99)
Glucose-Capillary: 239 mg/dL — ABNORMAL HIGH (ref 70–99)

## 2023-08-11 LAB — HEPARIN LEVEL (UNFRACTIONATED): Heparin Unfractionated: 0.31 [IU]/mL (ref 0.30–0.70)

## 2023-08-11 LAB — CBC
HCT: 28.7 % — ABNORMAL LOW (ref 36.0–46.0)
Hemoglobin: 9.1 g/dL — ABNORMAL LOW (ref 12.0–15.0)
MCH: 24.3 pg — ABNORMAL LOW (ref 26.0–34.0)
MCHC: 31.7 g/dL (ref 30.0–36.0)
MCV: 76.7 fL — ABNORMAL LOW (ref 80.0–100.0)
Platelets: 124 10*3/uL — ABNORMAL LOW (ref 150–400)
RBC: 3.74 MIL/uL — ABNORMAL LOW (ref 3.87–5.11)
RDW: 25.2 % — ABNORMAL HIGH (ref 11.5–15.5)
WBC: 7.3 10*3/uL (ref 4.0–10.5)
nRBC: 0 % (ref 0.0–0.2)

## 2023-08-11 MED ORDER — BASAGLAR KWIKPEN 100 UNIT/ML ~~LOC~~ SOPN: 30.0000 [IU] | PEN_INJECTOR | Freq: Every day | SUBCUTANEOUS | Status: DC

## 2023-08-11 MED ORDER — GLUCAGON EMERGENCY 1 MG IJ KIT: 1.0000 mg | PACK | INTRAMUSCULAR | Status: DC | PRN

## 2023-08-11 MED ORDER — PANTOPRAZOLE SODIUM 40 MG PO TBEC: DELAYED_RELEASE_TABLET | ORAL | Status: DC

## 2023-08-11 MED ORDER — CARVEDILOL 3.125 MG PO TABS: 3.1250 mg | ORAL_TABLET | Freq: Two times a day (BID) | ORAL | Status: DC

## 2023-08-11 MED ORDER — PANTOPRAZOLE SODIUM 40 MG PO TBEC
40.0000 mg | DELAYED_RELEASE_TABLET | Freq: Two times a day (BID) | ORAL | Status: DC
  Administered 2023-08-11 – 2023-08-12 (×2): 40 mg via ORAL
  Filled 2023-08-11 (×2): qty 1

## 2023-08-11 MED ORDER — APIXABAN 5 MG PO TABS: 5.0000 mg | ORAL_TABLET | Freq: Two times a day (BID) | ORAL | Status: DC

## 2023-08-11 MED ORDER — BLOOD GLUCOSE TEST VI STRP: 1.0000 | ORAL_STRIP | Freq: Three times a day (TID) | Status: DC

## 2023-08-11 MED ORDER — BLOOD GLUCOSE MONITORING SUPPL DEVI: 1.0000 | Freq: Three times a day (TID) | Status: DC

## 2023-08-11 MED ORDER — APIXABAN 5 MG PO TABS
5.0000 mg | ORAL_TABLET | Freq: Two times a day (BID) | ORAL | Status: DC
  Administered 2023-08-11 – 2023-08-12 (×3): 5 mg via ORAL
  Filled 2023-08-11 (×3): qty 1

## 2023-08-11 MED ORDER — LANCETS MISC: 1.0000 | Freq: Three times a day (TID) | Status: DC

## 2023-08-11 MED ORDER — PEN NEEDLES 31G X 5 MM MISC: 1.0000 | Freq: Three times a day (TID) | Status: DC

## 2023-08-11 MED ORDER — LANCET DEVICE MISC: 1.0000 | Freq: Three times a day (TID) | Status: DC

## 2023-08-11 NOTE — Progress Notes (Signed)
 PHARMACY - ANTICOAGULATION CONSULT NOTE  Pharmacy Consult for continuous infusion of unfractionated heparin , weight-based, heparin  level guided.  Indication: Superior mesenteric vein thrombosis  No Known Allergies  Patient Measurements: Height: 5' 5.98 (167.6 cm) Weight: 86.7 kg (191 lb 2.2 oz) IBW/kg (Calculated) : 59.26 Heparin  Dosing Weight: 78 killograms  Vital Signs: Temp: 98.9 F (37.2 C) (01/04 0333) Temp Source: Oral (01/04 0333) BP: 114/56 (01/04 0333) Pulse Rate: 71 (01/04 0333)  Labs: Recent Labs    08/09/23 1031 08/10/23 0423 08/11/23 0306  HGB 10.4* 9.7* 9.1*  HCT 32.6* 29.9* 28.7*  PLT 136* 133* 124*  HEPARINUNFRC 0.36 0.38 0.31  CREATININE 0.61  --   --     Estimated Creatinine Clearance: 74.7 mL/min (by C-G formula based on SCr of 0.61 mg/dL).   Medical History: Past Medical History:  Diagnosis Date   Arthritis    bilateral knees   Diabetes mellitus without complication (HCC)    GI bleed 07/31/2023    Medications:  Scheduled:   sodium chloride    Intravenous Once   sodium chloride    Intravenous Once   carvedilol   3.125 mg Oral BID WC   Chlorhexidine  Gluconate Cloth  6 each Topical Daily   insulin  aspart  0-15 Units Subcutaneous TID WC   insulin  glargine-yfgn  15 Units Subcutaneous BID   mupirocin  ointment   Nasal BID   pantoprazole  (PROTONIX ) IV  40 mg Intravenous Q12H    Assessment: CIUFH infusing at 1400 units/hr. Heparin  level at 0306h 0.31 units/mL, therapeutic, however, on lower end of target range of 0.3 - 0.7 units/mL.  Goal of Therapy:  Heparin  level 0.3-0.7 units/ml Monitor platelets by anticoagulation protocol: Yes   Plan:  Increase heparin  to 1,500 units/hour Continue to monitor H&H and platelets.  Monitor daily heparin  level   Once all interventional procedures have been performed or are no longer necessary, may give consideration to commencing LMWH enoxaparin 1mg /kg subcutaneously every 12 hours with concomitant  warfarin overlap for requisite five day minimum overlap until such time the INR is >2.0 for 24h (target INR range 2.0 - 3.0)--OR, may consider the role of a DOAC.   Lum Laine, PharmD PGY1 Pharmacy Resident 08/11/2023 7:58 AM

## 2023-08-11 NOTE — Progress Notes (Signed)
 Education and practice regarding insulin pen usage was done with patient at bedside.  Patient voiced understanding and comfort with pen usage.

## 2023-08-11 NOTE — Discharge Instructions (Addendum)
 To Ms. [name] or their caretakers,  They were admitted to [facility] on 07/30/2023 for evaluation and treatment of:  Principal Problem:   Cerebrovascular accident (CVA) [facility]) Active Problems:   Type 2 diabetes mellitus (HCC)   Decompensated cirrhosis (HCC)   Gastric varices   Splenic varices   Mesenteric vein thrombosis (HCC)  Resolved Problems:   Sepsis (HCC)   Syncope and collapse   SIRS (systemic inflammatory response syndrome) (HCC)   GI bleed   Hyperosmolar hyperglycemic state (HHS) (HCC)   Splenic infarct   Bleeding esophageal varices (HCC)  The evaluation suggested stroke, liver disease complicated by bleeding veins in the stomach. They were treated blood thinners and a procedure to treat the bleeding veins in the stomach called BRTO.  They were discharged from the hospital on 08/11/23. I recommend the following after leaving the hospital:   Start taking apixaban  5 mg twice daily to prevent stroke and prevent growth of the clot in the vein of your intestine.  Start taking pantoprazole  twice a day for 2 weeks, and then once a day to prevent bleeding from the stomach.  Start taking carvedilol  twice a day to prevent bleeding from the stomach.  Start taking insulin  glargine, 30 units daily for diabetes.  Check your blood sugar before you take your insulin  or anytime you feel unwell.  Do not take insulin  if your blood sugar is less than 70.  Follow-up with the gastroenterologist's office in about 6 weeks for a repeat esophagogastroduodenoscopy to check on the veins in your stomach.  Follow-up with the interventional radiologist for another CAT scan to check on the blood clot in the vein of your intestine.  Start seeing a primary doctor to help you with your complex medical problems.  For questions about your care plan, until you are able to see your primary doctor: Call [phone]. Dial 0 for the operator. Ask for the internal medicine  resident on call.  Ozell Kung MD 08/11/2023, 11:02 AM  =================================================================================================== Information on my medicine - ELIQUIS  (apixaban )  This medication education was reviewed with me or my healthcare representative as part of my discharge preparation.    Why was Eliquis  prescribed for you? Eliquis  was prescribed to treat blood clots that may have been found in the veins of your legs (deep vein thrombosis) or in your lungs (pulmonary embolism) and to reduce the risk of them occurring again.  What do You need to know about Eliquis  ? The dose is ONE 5 mg tablet taken TWICE daily.  Eliquis  may be taken with or without food.   Try to take the dose about the same time in the morning and in the evening. If you have difficulty swallowing the tablet whole please discuss with your pharmacist how to take the medication safely.  Take Eliquis  exactly as prescribed and DO NOT stop taking Eliquis  without talking to the doctor who prescribed the medication.  Stopping may increase your risk of developing a new blood clot.  Refill your prescription before you run out.  After discharge, you should have regular check-up appointments with your healthcare provider that is prescribing your Eliquis .    What do you do if you miss a dose? If a dose of ELIQUIS  is not taken at the scheduled time, take it as soon as possible on the same day and twice-daily administration should be resumed. The dose should not be doubled to make up for a missed dose.  Important Safety Information A  possible side effect of Eliquis  is bleeding. You should call your healthcare provider right away if you experience any of the following: Bleeding from an injury or your nose that does not stop. Unusual colored urine (red or dark brown) or unusual colored stools (red or black). Unusual bruising for unknown reasons. A serious fall or if you hit your head (even  if there is no bleeding).  Some medicines may interact with Eliquis  and might increase your risk of bleeding or clotting while on Eliquis . To help avoid this, consult your healthcare provider or pharmacist prior to using any new prescription or non-prescription medications, including herbals, vitamins, non-steroidal anti-inflammatory drugs (NSAIDs) and supplements.  This website has more information on Eliquis  (apixaban ): [email or web address] .[email or web address] dena

## 2023-08-11 NOTE — Progress Notes (Signed)
                 Interval history Had some indigestion overnight, dull pain in the epigastrium after eating.  No other concerns.  Looking forward to going home after rehab.  Physical exam Blood pressure (!) 126/56, pulse 75, temperature 98.3 F (36.8 C), temperature source Oral, resp. rate 18, height 5' 5.98 (1.676 m), weight 86.7 kg, SpO2 98%.  No distress Heart rate and rhythm are normal, radial pulses strong Breathing comfortably on room air Skin is warm and dry Abdomen soft and nontender to palpation and percussion Alert and oriented, speech is normal sounding, no facial asymmetry, no gross motor deficits  Labs, images, and other studies Stable hemoglobin  Assessment and plan Hospital day 11  Maria Rose is a 68 y.o. admitted for syncope, found to have strokes in bilateral brain hemispheres, hospital course complicated by bleeding gastric varices, now status post BRTO.  Stable for discharge to CIR.  Principal Problem:   Cerebrovascular accident (CVA) (HCC) Stable.  No appreciable deficits on cursory neurologic exam.  Likely embolic given bilateral hemispheric distribution.  Cryptogenic, no A-fib on telemetry.  Notably, at one point there was a concern for sepsis without obvious source of infection and TEE was considered to look for intracardiac vegetations but this was not done because of her variceal bleeding.  Discontinued heparin  in favor of DOAC today.  Stable for discharge CIR, outpatient follow-up with stroke clinic and cardiology. - Eliquis  5 mg twice daily - Ambulatory cardiac monitoring  Active Problems:   Type 2 diabetes New diagnosis on admission.  A1c around 11.  Still some hyperglycemia.  On 15 units of glargine twice daily, plus moderate correctional dose insulin .  Plan to discharge on 30 units, maybe slightly more, of glargine daily.    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  Completed course ceftriaxone  for SBP prophylaxis in the  setting of this bleeding.  However she did not have ascites.  Her platelets are stable.  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    Gastric varices Stable hemoglobin.  No more bleeding.  Now on low-dose of beta-blocker for secondary prevention of bleeding.  Safe to start anticoagulation.  Status post BRTO. - Carvedilol  3.125 mg twice daily - Pantoprazole  40 mg p.o. twice daily    Iron  deficiency anemia From bleeding gastric varices.  Got 4 units of blood.  Probably still has an iron  deficit.  Will repeat iron  studies overnight and may plan for outpatient iron  infusion.    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  Nonocclusive.  I think she is asymptomatic.  Her second indication for apixaban .  Resolved Problems:   Sepsis (HCC)   Syncope and collapse   SIRS (systemic inflammatory response syndrome) (HCC)   GI bleed   Splenic varices   Hyperosmolar hyperglycemic state (HHS) (HCC)   Splenic infarct   Bleeding esophageal varices (HCC)  Diet: Carb modified IVF: N/A VTE: Therapeutic dose apixaban  Code: Full PT/OT recommendations: CIR TOC recommendations: N/A Family Update: N/A  Discharge plan: Stable for discharge to CIR.  Ozell Kung MD 08/11/2023, 1:06 PM  Pager: [phone] After 5pm or weekend: [phone]

## 2023-08-12 LAB — CBC
HCT: 29.8 % — ABNORMAL LOW (ref 36.0–46.0)
Hemoglobin: 9.6 g/dL — ABNORMAL LOW (ref 12.0–15.0)
MCH: 24.5 pg — ABNORMAL LOW (ref 26.0–34.0)
MCHC: 32.2 g/dL (ref 30.0–36.0)
MCV: 76 fL — ABNORMAL LOW (ref 80.0–100.0)
Platelets: 139 10*3/uL — ABNORMAL LOW (ref 150–400)
RBC: 3.92 MIL/uL (ref 3.87–5.11)
RDW: 25.3 % — ABNORMAL HIGH (ref 11.5–15.5)
WBC: 8.1 10*3/uL (ref 4.0–10.5)
nRBC: 0 % (ref 0.0–0.2)

## 2023-08-12 LAB — GLUCOSE, CAPILLARY
Glucose-Capillary: 187 mg/dL — ABNORMAL HIGH (ref 70–99)
Glucose-Capillary: 299 mg/dL — ABNORMAL HIGH (ref 70–99)

## 2023-08-12 LAB — HEPARIN LEVEL (UNFRACTIONATED): Heparin Unfractionated: >1.1 [IU]/mL — ABNORMAL HIGH (ref 0.30–0.70)

## 2023-08-12 MED ORDER — BLOOD GLUCOSE MONITORING SUPPL DEVI: 1.0000 | Freq: Three times a day (TID) | 2 refills | Status: AC

## 2023-08-12 MED ORDER — CARVEDILOL 3.125 MG PO TABS: 3.1250 mg | ORAL_TABLET | Freq: Two times a day (BID) | ORAL | 1 refills | Status: DC

## 2023-08-12 MED ORDER — GLUCAGON EMERGENCY 1 MG IJ KIT: 1.0000 mg | PACK | INTRAMUSCULAR | 1 refills | Status: AC | PRN

## 2023-08-12 MED ORDER — LANCETS MISC: 1.0000 | Freq: Three times a day (TID) | 2 refills | Status: AC

## 2023-08-12 MED ORDER — BLOOD GLUCOSE TEST VI STRP: 1.0000 | ORAL_STRIP | Freq: Three times a day (TID) | 2 refills | Status: AC

## 2023-08-12 MED ORDER — APIXABAN 5 MG PO TABS: 5.0000 mg | ORAL_TABLET | Freq: Two times a day (BID) | ORAL | 0 refills | Status: DC

## 2023-08-12 MED ORDER — LANCET DEVICE MISC: 1.0000 | Freq: Three times a day (TID) | 3 refills | Status: AC

## 2023-08-12 MED ORDER — BASAGLAR KWIKPEN 100 UNIT/ML ~~LOC~~ SOPN: 30.0000 [IU] | PEN_INJECTOR | Freq: Every day | SUBCUTANEOUS | 1 refills | Status: DC

## 2023-08-12 MED ORDER — APIXABAN 5 MG PO TABS: 5.0000 mg | ORAL_TABLET | Freq: Two times a day (BID) | ORAL | 1 refills | Status: DC

## 2023-08-12 MED ORDER — PANTOPRAZOLE SODIUM 40 MG PO TBEC: DELAYED_RELEASE_TABLET | ORAL | 0 refills | Status: DC

## 2023-08-12 MED ORDER — PEN NEEDLES 31G X 5 MM MISC: 1.0000 | Freq: Three times a day (TID) | 3 refills | Status: DC

## 2023-08-12 NOTE — Care Management (Signed)
    Durable Medical Equipment  (From admission, onward)           Start     Ordered   08/12/23 1419  For home use only DME 3 n 1  Once        08/12/23 1418   08/12/23 1224  For home use only DME Walker rolling  Once       Question Answer Comment  Walker: With 5 Inch Wheels   Patient needs a walker to treat with the following condition Unsteady gait      08/12/23 1223

## 2023-08-12 NOTE — TOC Transition Note (Addendum)
 Transition of Care [facility]) - Discharge Note   Patient Details  Name: Maria Rose MRN: [id number] Date of Birth: November 10, 1955  [facility]) CM/SW Contact:  Robynn Eileen Hoose, RN Phone Number: 08/12/2023, 2:09 PM   Clinical Narrative:    Patient is being discharged today. Order for RW and BSC/3:1 noted. DME ordered through [place] with [employer] to be delivered before discharge. Spoke with patient, discussed HH copay requirement. Patient ok with going to outpatient rehab for therapy services.  1427: Eliquis  30 day free trial card and co-pay card sent to unit to give to patient at discharge.   Final next level of care: Home/Self Care Barriers to Discharge: No Barriers Identified   Patient Goals and CMS Choice            Discharge Placement                       Discharge Plan and Services Additional resources added to the After Visit Summary for                  DME Arranged: Walker rolling, 3-N-1 DME Agency: [employer] Date DME Agency Contacted: 08/12/23 Time DME Agency Contacted: [id number] Representative spoke with at DME Agency: [place]            Social Drivers of Health (SDOH) Interventions SDOH Screenings   Food Insecurity: No Food Insecurity (07/30/2023)  Housing: Unknown (07/30/2023)  Transportation Needs: No Transportation Needs (07/30/2023)  Utilities: Not At Risk (07/30/2023)  Social Connections: Unknown (05/14/2023)   Received from [facility]  Tobacco Use: High Risk (08/05/2023)     Readmission Risk Interventions     No data to display

## 2023-08-12 NOTE — Plan of Care (Signed)
  Problem: Education: Goal: Ability to describe self-care measures that may prevent or decrease complications (Diabetes Survival Skills Education) will improve Outcome: Adequate for Discharge Goal: Individualized Educational Video(s) Outcome: Adequate for Discharge   Problem: Fluid Volume: Goal: Ability to maintain a balanced intake and output will improve Outcome: Adequate for Discharge   Problem: Health Behavior/Discharge Planning: Goal: Ability to identify and utilize available resources and services will improve Outcome: Adequate for Discharge Goal: Ability to manage health-related needs will improve Outcome: Adequate for Discharge   Problem: Metabolic: Goal: Ability to maintain appropriate glucose levels will improve Outcome: Adequate for Discharge   Problem: Nutritional: Goal: Maintenance of adequate nutrition will improve Outcome: Adequate for Discharge Goal: Progress toward achieving an optimal weight will improve Outcome: Adequate for Discharge   Problem: Skin Integrity: Goal: Risk for impaired skin integrity will decrease Outcome: Adequate for Discharge   Problem: Tissue Perfusion: Goal: Adequacy of tissue perfusion will improve Outcome: Adequate for Discharge   Problem: Education: Goal: Knowledge of General Education information will improve Description: Including pain rating scale, medication(s)/side effects and non-pharmacologic comfort measures Outcome: Adequate for Discharge   Problem: Health Behavior/Discharge Planning: Goal: Ability to manage health-related needs will improve Outcome: Adequate for Discharge   Problem: Clinical Measurements: Goal: Ability to maintain clinical measurements within normal limits will improve Outcome: Adequate for Discharge Goal: Will remain free from infection Outcome: Adequate for Discharge Goal: Diagnostic test results will improve Outcome: Adequate for Discharge Goal: Respiratory complications will improve Outcome:  Adequate for Discharge   Problem: Activity: Goal: Risk for activity intolerance will decrease Outcome: Adequate for Discharge   Problem: Nutrition: Goal: Adequate nutrition will be maintained Outcome: Adequate for Discharge   Problem: Elimination: Goal: Will not experience complications related to bowel motility Outcome: Adequate for Discharge   Problem: Safety: Goal: Ability to remain free from injury will improve Outcome: Adequate for Discharge   Problem: Skin Integrity: Goal: Risk for impaired skin integrity will decrease Outcome: Adequate for Discharge   Problem: Education: Goal: Knowledge of disease or condition will improve Outcome: Adequate for Discharge   Problem: Ischemic Stroke/TIA Tissue Perfusion: Goal: Complications of ischemic stroke/TIA will be minimized Outcome: Adequate for Discharge   Problem: Coping: Goal: Will verbalize positive feelings about self Outcome: Adequate for Discharge Goal: Will identify appropriate support needs Outcome: Adequate for Discharge   Problem: Health Behavior/Discharge Planning: Goal: Ability to manage health-related needs will improve Outcome: Adequate for Discharge Goal: Goals will be collaboratively established with patient/family Outcome: Adequate for Discharge   Problem: Self-Care: Goal: Ability to participate in self-care as condition permits will improve Outcome: Adequate for Discharge Goal: Verbalization of feelings and concerns over difficulty with self-care will improve Outcome: Adequate for Discharge Goal: Ability to communicate needs accurately will improve Outcome: Adequate for Discharge   Problem: Nutrition: Goal: Risk of aspiration will decrease Outcome: Adequate for Discharge Goal: Dietary intake will improve Outcome: Adequate for Discharge

## 2023-08-12 NOTE — Progress Notes (Signed)
 Physical Therapy Treatment Patient Details Name: Maria Rose MRN: [id number] DOB: 1956-03-03 Today's Date: 08/12/2023   History of Present Illness 68 y.o. female presents to [facility] hospital on 07/30/2023 after a fall and shaking episode. 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 Additional punctate acute infarcts in right cerebellum, left temporal lobe, left occipital lobe, right occipital lobe, right parietal lobe, right frontal lobe, right thalamus, left frontal lobe. Pt with bloody bowel movement and hematemesis on 12/24. PMH includes hepatic cirrhosis, gastric and splenic varices, sepsis.    PT Comments  Patient seen for possible discharge home (vs AIR prior to home). Patient has made progress and able to ambulate with CGA with RW. She was able to ascend single step with RW and CGA (does have second floor bedroom and shower, but plans on staying downstairs). Reports she has 24 hr assist between spouse and 47 yo son. Patient doing well enough to discharge home with HHPT and RW. Notified MD, RN, and Case Manager.    If plan is discharge home, recommend the following: Assistance with cooking/housework;Direct supervision/assist for medications management;Direct supervision/assist for financial management;Assist for transportation;Help with stairs or ramp for entrance;A little help with walking and/or transfers;Supervision due to cognitive status   Can travel by private vehicle        Equipment Recommendations  Rolling walker (2 wheels) (states she does not have a RW at home)    Recommendations for Other Services       Precautions / Restrictions Precautions Precautions: Fall;Other (comment) Precaution Comments: L visual field deficits Restrictions Weight Bearing Restrictions Per Provider Order: No     Mobility  Bed Mobility Overal bed mobility: Needs Assistance Bed Mobility: Sit to  Supine       Sit to supine: Supervision   General bed mobility comments: pt sitting EOB eating lunch on arrival; returned to supine    Transfers Overall transfer level: Needs assistance Equipment used: Rolling walker (2 wheels) Transfers: Sit to/from Stand Sit to Stand: Contact guard assist           General transfer comment: vc for sequencing with RW    Ambulation/Gait Ambulation/Gait assistance: Contact guard assist, Min assist Gait Distance (Feet): 120 Feet (without device; 120 with RW) Assistive device: None, Rolling walker (2 wheels) Gait Pattern/deviations: Step-through pattern, Decreased stride length, Drifts right/left, Wide base of support, Antalgic Gait velocity: little ability to vary velocity up or down Gait velocity interpretation: 1.31 - 2.62 ft/sec, indicative of limited community ambulator   General Gait Details: pt with antalgic gait with noted limp on L side and reporting foot pain. Pt with imbalance x3 requiring min assist to recover when no device used. With RW required only CGA with vc for proximity and safe use   Stairs   Stairs assistance: Contact guard assist Stair Management: Step to pattern, Forwards, With walker Number of Stairs: 1 (x 2) General stair comments: no imbalance with use of RW for one step; pt plans on staying downstairs until she feels she can go upstairs with son's assist   Wheelchair Mobility     Tilt Bed    Modified Rankin (Stroke Patients Only) Modified Rankin (Stroke Patients Only) Pre-Morbid Rankin Score: No significant disability Modified Rankin: Moderately severe disability     Balance Overall balance assessment: Needs assistance Sitting-balance support: No upper extremity supported, Feet supported Sitting balance-Leahy Scale: Good     Standing balance support: During functional  activity, No upper extremity supported Standing balance-Leahy Scale: Fair Standing balance comment: can static stand without UE  support; dynamically needs UE support                 Standardized Balance Assessment Standardized Balance Assessment : Dynamic Gait Index   Dynamic Gait Index Level Surface: Moderate Impairment Change in Gait Speed: Moderate Impairment Gait with Horizontal Head Turns: Mild Impairment Gait with Vertical Head Turns: Mild Impairment Gait and Pivot Turn: Mild Impairment Step Over Obstacle: Mild Impairment Step Around Obstacles: Mild Impairment Steps: Moderate Impairment Total Score: 13      Cognition Arousal: Alert Behavior During Therapy: WFL for tasks assessed/performed Overall Cognitive Status: Impaired/Different from baseline                           Safety/Judgement: Decreased awareness of safety, Decreased awareness of deficits Awareness: Emergent Problem Solving: Slow processing General Comments: pt A&Ox4; reports she has been walking without device or IV pole and been fine Pt with imbalance within first 15 ft and 2 more as ambulated farther        Exercises      General Comments        Pertinent Vitals/Pain Pain Assessment Pain Assessment: Faces Faces Pain Scale: Hurts little more Pain Location: bil feet (arthritis) Pain Descriptors / Indicators: Discomfort, Sore Pain Intervention(s): Limited activity within patient's tolerance, Monitored during session    Home Living Family/patient expects to be discharged to:: Private residence Living Arrangements: Spouse/significant other;Children Available Help at Discharge: Family;Available 24 hours/day Type of Home: House Home Access: Stairs to enter Entrance Stairs-Rails: None Entrance Stairs-Number of Steps: 1 Alternate Level Stairs-Number of Steps: 18 Home Layout: Two level;1/2 bath on main level;Able to live on main level with bedroom/bathroom (pt's bedroom is upstairs, however she plans to stay downstairs for now) Home Equipment: None      Prior Function            PT Goals (current  goals can now be found in the care plan section) Acute Rehab PT Goals Patient Stated Goal: to return to independence PT Goal Formulation: With patient Time For Goal Achievement: 08/14/23 Potential to Achieve Goals: Fair Progress towards PT goals: Progressing toward goals    Frequency    Min 1X/week      PT Plan      Co-evaluation              AM-PAC PT 6 Clicks Mobility   Outcome Measure  Help needed turning from your back to your side while in a flat bed without using bedrails?: A Little Help needed moving from lying on your back to sitting on the side of a flat bed without using bedrails?: A Little Help needed moving to and from a bed to a chair (including a wheelchair)?: A Little Help needed standing up from a chair using your arms (e.g., wheelchair or bedside chair)?: A Little Help needed to walk in hospital room?: A Little Help needed climbing 3-5 steps with a railing? : A Little 6 Click Score: 18    End of Session Equipment Utilized During Treatment: Gait belt Activity Tolerance: Patient tolerated treatment well Patient left: in bed;with call bell/phone within reach Nurse Communication: Mobility status;Other (comment) (OK to dc from PT perspective; needs RW and HHPT) PT Visit Diagnosis: Other abnormalities of gait and mobility (R26.89);Muscle weakness (generalized) (M62.81)     Time: 8847-8793 PT Time Calculation (min) (ACUTE ONLY): 14  min  Charges:    $Gait Training: 8-22 mins PT General Charges $$ ACUTE PT VISIT: 1 Visit                      Macario RAMAN, PT Acute Rehabilitation Services  Office [phone]    Macario SHAUNNA Soja 08/12/2023, 12:19 PM

## 2023-08-12 NOTE — Discharge Summary (Signed)
 Name: Maria Rose MRN: [id number] DOB: 02-24-1956 68 y.o. PCP: Pcp, No  Date of Admission: 07/30/2023 11:15 AM Date of Discharge: 08/12/2023 08/12/2023 Attending Physician: Dr. Lovie  DISCHARGE DIAGNOSIS:  Primary Problem: Cerebrovascular accident (CVA) [facility])   Hospital Problems: Principal Problem:   Cerebrovascular accident (CVA) [facility]) Active Problems:   Type 2 diabetes mellitus (HCC)   Decompensated cirrhosis (HCC)   Gastric varices   Mesenteric vein thrombosis (HCC)   Other cirrhosis of liver (HCC)   Iron  deficiency anemia   DISCHARGE MEDICATIONS:   Allergies as of 08/12/2023   No Known Allergies      Medication List     TAKE these medications    apixaban  5 MG Tabs tablet Commonly known as: ELIQUIS  Take 1 tablet (5 mg total) by mouth 2 (two) times daily.   apixaban  5 MG Tabs tablet Commonly known as: ELIQUIS  Take 1 tablet (5 mg total) by mouth 2 (two) times daily. Start taking on: September 12, 2023   Basaglar  KwikPen 100 UNIT/ML Inject 30 Units into the skin daily. May substitute as needed per insurance.   Blood Glucose Monitoring Suppl Devi 1 each by Does not apply route 3 (three) times daily. May dispense any manufacturer covered by patient's insurance.   BLOOD GLUCOSE TEST STRIPS Strp 1 each by Does not apply route 3 (three) times daily. Use as directed to check blood sugar. May dispense any manufacturer covered by patient's insurance and fits patient's device.   carvedilol  3.125 MG tablet Commonly known as: COREG  Take 1 tablet (3.125 mg total) by mouth 2 (two) times daily with a meal.   clotrimazole  1 % cream Commonly known as: LOTRIMIN  Apply to affected area 2 times daily   erythromycin ophthalmic ointment Place 1 Application into the left eye 4 (four) times daily.   Glucagon  Emergency 1 MG Kit Inject 1 mg into the skin as needed for up to 2 doses (Severe low blood sugar).   Lancet Device Misc 1 each by Does not apply route 3 (three) times  daily. May dispense any manufacturer covered by patient's insurance.   Lancets Misc 1 each by Does not apply route 3 (three) times daily. Use as directed to check blood sugar. May dispense any manufacturer covered by patient's insurance and fits patient's device.   mupirocin  ointment 2 % Commonly known as: BACTROBAN  Apply 1 Application topically 2 (two) times daily.   pantoprazole  40 MG tablet Commonly known as: PROTONIX  Take 1 tablet (40 mg total) by mouth 2 (two) times daily for 14 days, THEN 1 tablet (40 mg total) daily. Start taking on: August 12, 2023   Pen Needles 31G X 5 MM Misc 1 each by Does not apply route 3 (three) times daily. May dispense any manufacturer covered by patient's insurance.               Durable Medical Equipment  (From admission, onward)           Start     Ordered   08/12/23 1419  For home use only DME 3 n 1  Once        08/12/23 1418   08/12/23 1224  For home use only DME Walker rolling  Once       Question Answer Comment  Walker: With 5 Inch Wheels   Patient needs a walker to treat with the following condition Unsteady gait      08/12/23 1223            DISPOSITION  AND FOLLOW-UP:  Maria Rose was discharged from [facility] in [place] condition. At the hospital follow up visit please address:  Acute embolic stroke: Unclear etiology. Stroke workup was limited by variceal bleeding. She will do a 30 day ambulatory cardiac monitor and will follow up outpatient with cardiology for possible repeat echocardiogram or cardiac CT. She was discharged on eliquis  5 mg BID; ensure she has refills and is taking it regularly. She should follow up with [facility] in 4 weeks. She will continue her rehabilitation with home health PT and OT.   Decompensated cirrhosis, complicated by variceal bleeding: She has not seen a physician in over a decade, so this cirrhosis was a new diagnosis for her. It is thought to be  secondary to [facility]. Recommend further outpatient workup and treatment for her cirrhosis. She underwent a BRTO procedure for her varices, which was successful, however she developed an acute superior mesenteric vein thrombus which we treated with heparin  (transitioned to eliquis  on discharge). She was also started on protonix  and coreg  for variceal bleeding prophylaxis. Recommend screening for GI symptoms, including bleeding, at her follow up appointment. Also recommend rechecking a CBC at follow up given her anemia and thrombocytopenia. She has a post-procedure follow up appointment with IR in 4 weeks. She also has a follow up EGD scheduled with GI in 6 weeks. Ensure she is aware of these appointments.   Type 2 Diabetes Mellitus: Newly diagnosed on this admission with an A1c of 11.4. Insulin  titrated up to 30 units insulin  glargine daily. She received inpatient diabetes education and an outpatient consult was placed to the diabetes educator since she is new to insulin . Recommend close follow up and strict glucose control outpatient.   Microcytic anemia, iron  deficiency: recommend rechecking iron  studies outpatient and replete iron  as indicated.   Usual interstitial pneumonia, small lung nodule: seen on CT during hospital admission. Consult placed for outpatient follow up with pulmonology and a repeat scan in 6 months.    Follow-up Appointments:  Follow-up Information     [facility] Follow up in 3 month(s).   Specialty: Radiology Why: Patient to be scheduled for IR folow up and CT BRTO protocol in 3 months. IR scheduler will call with appoinment date and time. Please call [phone] or after hours number [phone] with any questions or concerns. Contact information: [address] [place]  [phone]        [facility] Follow up.   Specialty: Gastroenterology Why: Esophagogastroduedonoscopy  in 6 weeks time from discharge Contact information: [address] [place]  [address] [phone]        [facility] Follow up.   Why: For primary care needs Contact information: [address] [place]  [phone]        [facility] Follow up.   Specialty: Rehabilitation Why: Physical, Occupational, Speech therapy. Office will call to arrange follow up after hospital discharge. Contact information: [address] [place]  [address] [phone]                HOSPITAL COURSE:  Patient Summary: Cryptogenic stroke, likely embolic She presented to the emergency department with a 3-day history of headaches and a syncopal episode.  CT head showed a cortical hypodensity in the right parieto-occipital junction concerning for an acute or subacute infarct.  MRI brain demonstrated acute to  early subacute infarcts in the right posterior temporal and lateral occipital and parietal cortex, in addition to punctate acute infarcts in the right cerebellum, left temporal lobe, left occipital lobe, right occipital lobe, right parietal lobe, right frontal lobe, right thalamus, and left frontal lobe. This scattered distribution is suggestive of an embolic phenomenon.  CTA of the head demonstrated some intracranial atherosclerosis with mild bilateral ICA stenosis, as well as cervical carotid atherosclerosis without significant stenosis. She received a TTE, which was limited by poor windows, but did not demonstrate any thrombus.  Anticoagulation was held in the setting of her GI bleeding.  Neurology recommended a TEE to evaluate for a cardioembolic source of the stroke, however cardiology declined to move forward with the procedure in the setting of her esophageal varices.  They instead recommended an outpatient repeat TTE or cardiac CT if there is high concern of  a cardiac thrombus.   Given her cryptogenic stoke and acute venous thromboembolism, we discharged her with 3 months of Eliquis .  We have also set up ambulatory cardiac monitoring for 30 days to monitor for paroxysmal arrhythmias.  She will need outpatient follow-up with cardiology and with the stroke clinic at [facility] in 4 weeks.  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  Shortly thereafter, she became hypotensive with her systolic blood pressures into the 80s and a drop in her hemoglobin from 10.3-5.3.  Lactic acid climbed from 2.8-8.7.  She was admitted to the ICU for management and was given 2 units packed red blood cells and 1 L normal saline bolus with improvement of her hemodynamic status.  She had no history of previous GI bleeds, alcohol use, or antiplatelets/anticoagulation.  She was started on empiric broad-spectrum antibiotics, octreotide , IV PPI, and GI was consulted for evaluation. 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  3 mL of hemostatic gel was applied to the varix. IR was consulted for BRTO, which was completed on 12/29.  Postprocedure CT angiogram showed successful occlusion of gastric varices with splenorenal shunt, but did demonstrate an acute, non-occlusive superior mesenteric vein mural thrombus.  She was started on IV heparin  for VTE treatment and was reevaluated by interventional radiology. She was transitioned to oral anticoagulation and will follow up in 6 weeks for a repeat EGD with GI. She will also follow up with IR outpatient in 4 weeks.   Thrombocytopenia, in the setting of cirrhosis Platelets were low on admission in the 80s, improved to 130s over the  course of the hospitalization.  Likely secondary to her newly discovered cirrhosis.   Microcytic anemia, secondary to iron  deficiency in the setting of GI bleeding She had microcytic anemia in the setting of an acute GI bleed.  An iron  panel demonstrated significant iron  deficiency, which may also be secondary to this acute bleed.  With her bleeding resolved, she may need oral iron  repletion outpatient if her anemia and iron  deficiency persists.   Acute SMV thrombus On 1/1, a post procedural CT angiogram demonstrated a new non-occlusive superior mesenteric vein mural thrombus.  She was asymptomatic.  She was started on IV heparin  and monitored closely for any signs of rebleeding. She tolerated it well and was transitioned to oral eliquis . She  will follow up with IR outpatient.   T2DM Her hemoglobin A1c was 11.4 on admission. She had not seen a doctor in 10 years.  Her fasting blood sugars have remained elevated, so we have titrated up her long-acting insulin  to 30 units daily. She received inpatient education from our diabetes education team and an outpatient diabetes education consult was also placed. She will need close outpatient follow-up.  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 Needs outpatient pulmonology follow-up with Dr. Geronimo or Dr. Theophilus and CT chest in 6 months.    DISCHARGE INSTRUCTIONS:   Discharge Instructions     Ambulatory referral to Neurology   Complete by: As directed    An appointment is requested in approximately: 4 weeks   Ambulatory referral to Pulmonology   Complete by: As directed    Patient needs outpatient pulmonology follow-up with Dr. Geronimo or Dr. Theophilus and CT chest in 6 months for f/u of probable UIP and a lung nodule   Reason for referral: Lung Mass/Lung Nodule   Call MD for:  difficulty breathing, headache or visual disturbances   Complete by: As directed    Call MD for:  extreme fatigue   Complete by: As directed     Call MD for:  persistant dizziness or light-headedness   Complete by: As directed    Call MD for:  persistant nausea and vomiting   Complete by: As directed    Call MD for:  severe uncontrolled pain   Complete by: As directed    Diet - low sodium heart healthy   Complete by: As directed    Discharge instructions   Complete by: As directed    You were hospitalized for an embolic stroke and gastric variceal bleeding due to newly discovered liver cirrhosis. You underwent a stroke and GI bleeding workup, including a BRTO procedure. Your bleeding has resolved and you are now medically stable for discharge. Thank you for allowing us  to be part of your care.   We will arrange for you to follow up with our clinic in the next few weeks. We will call you with the appointment time once it is scheduled.  You have a cardiology follow up appointment on April 4th at 1:40 pm at [facility] at [facility]. You also will follow up with the interventional radiologist who did your BRTO in 4 weeks. They should contact you about these appointments.   Please note these changes made to your medications:   Please START taking:   Eliquis  (apixaban ) 5 mg, twice daily. This is your blood thinner to help prevent further blood clots and strokes.   Insulin  glargine (Semglee ) 30 units daily, which is your long-acting insulin . The diabetes educator met with you to teach you about this. Please check your fasting blood sugar daily before breakfast.   Carvedilol  3.125 mg, twice daily with meals, to prevent further bleeding of your gastric varices.   Pantoprazole  (Protonix ) 40 mg twice daily for 14 days, then once daily indefinitely to prevent variceal bleeding.   We have also ordered an ambulatory 30 day cardiac monitor, which should be delivered to your home. They should contact you about how to use this and who to follow up with after the 30 days of wearing it.   We placed an order for home health  physical therapy and occupational therapy. They should contact you to set this up.   If you notice any new bleeding, severe abdominal pain or bloating,  lightheadedness, vision changes, new weakness or difficulty speaking, please return to the emergency department for evaluation.   Please call our clinic if you have any questions or concerns, we may be able to help and keep you from a long and expensive emergency room wait. Our clinic and after hours phone number is [phone], the best time to call is Monday through Friday 9 am to 4 pm but there is always someone available 24/7 if you have an emergency. If you need medication refills please notify your pharmacy one week in advance and they will send us  a request.   Face-to-face encounter (required for Medicare/Medicaid patients)   Complete by: As directed    I Ozell Riff certify that this patient is under my care and that I, or a nurse practitioner or physician's assistant working with me, had a face-to-face encounter that meets the physician face-to-face encounter requirements with this patient on 08/12/2023. The encounter with the patient was in whole, or in part for the following medical condition(s) which is the primary reason for home health care (List medical condition): Acute stroke and deconditioning   The encounter with the patient was in whole, or in part, for the following medical condition, which is the primary reason for home health care: Acute stroke, new decompensated cirrhosis with variceal bleeding   I certify that, based on my findings, the following services are medically necessary home health services:  Nursing Physical therapy     Reason for Medically Necessary Home Health Services: Therapy- Investment Banker, Operational, Patent Examiner   My clinical findings support the need for the above services: Unsafe ambulation due to balance issues   Further, I certify that my clinical findings support that this patient is  homebound due to: Unsafe ambulation due to balance issues   Home Health   Complete by: As directed    To provide the following care/treatments:  PT OT RN     Increase activity slowly   Complete by: As directed        SUBJECTIVE:   Patient was evaluated bedside. She is doing well this morning. Feels she is ready for discharge, pending CIR. She is asking about whether she is able to go home with home health PT. Denies any new bleeding.  Discharge Vitals:   BP 137/61 (BP Location: Left Arm)   Pulse 79   Temp 98.1 F (36.7 C) (Oral)   Resp 18   Ht 5' 5.98 (1.676 m)   Wt 86.7 kg   SpO2 99%   BMI 30.87 kg/m   OBJECTIVE:  Physical Exam Constitutional:      Appearance: Normal appearance.  HENT:     Mouth/Throat:     Mouth: Mucous membranes are moist.  Cardiovascular:     Rate and Rhythm: Normal rate and regular rhythm.     Pulses: Normal pulses.     Heart sounds: Normal heart sounds.  Pulmonary:     Effort: Pulmonary effort is normal.     Breath sounds: Normal breath sounds.  Abdominal:     General: Abdomen is flat. Bowel sounds are normal.     Palpations: Abdomen is soft.  Neurological:     General: No focal deficit present.     Mental Status: She is alert and oriented to person, place, and time.     Pertinent Labs, Studies, and Procedures:     Latest Ref Rng & Units 08/12/2023    8:02 AM 08/11/2023    3:06 AM 08/10/2023  4:23 AM  CBC  WBC 4.0 - 10.5 K/uL 8.1  7.3  8.5   Hemoglobin 12.0 - 15.0 g/dL 9.6  9.1  9.7   Hematocrit 36.0 - 46.0 % 29.8  28.7  29.9   Platelets 150 - 400 K/uL 139  124  133        Latest Ref Rng & Units 08/09/2023   10:31 AM 08/06/2023    4:50 AM 08/05/2023    4:20 AM  CMP  Glucose 70 - 99 mg/dL 776  748  814   BUN 8 - 23 mg/dL 10  11  8    Creatinine 0.44 - 1.00 mg/dL 9.38  9.30  9.36   Sodium 135 - 145 mmol/L 136  136  136   Potassium 3.5 - 5.1 mmol/L 3.6  3.7  3.8   Chloride 98 - 111 mmol/L 102  102  102   CO2 22 - 32 mmol/L 23   23  25    Calcium  8.9 - 10.3 mg/dL 9.1  8.6  8.3   Total Protein 6.5 - 8.1 g/dL  6.2  5.6   Total Bilirubin <1.2 mg/dL  0.8  0.7   Alkaline Phos 38 - 126 U/L  52  50   AST 15 - 41 U/L  30  28   ALT 0 - 44 U/L  33  34    ECHOCARDIOGRAM COMPLETE BUBBLE STUDY Result Date: 07/31/2023 IMPRESSIONS  1. Left ventricular ejection fraction, by estimation, is 60 to 65%. The left ventricle has normal function. The left ventricle has no regional wall motion abnormalities. Left ventricular diastolic parameters are consistent with Grade I diastolic dysfunction (impaired relaxation).  2. Right ventricular systolic function is normal. The right ventricular size is normal.  3. Suboptimal bubble study- no shunting seen.  4. The mitral valve is degenerative. No evidence of mitral valve regurgitation.  5. The aortic valve was not well visualized. Aortic valve regurgitation is not visualized. No aortic stenosis is present. Electronically signed by Stanly Leavens MD Signature Date/Time: 07/31/2023/2:39:51 PM  CT ANGIO HEAD NECK W WO CM Result Date: 07/31/2023 IMPRESSION: 1. No large vessel occlusion. 2. Intracranial atherosclerosis with mild bilateral ICA stenoses. 3. Cervical carotid atherosclerosis without significant stenosis. 4.  Aortic Atherosclerosis (ICD10-I70.0). Electronically Signed   By: Dasie Hamburg M.D.   On: 07/31/2023 11:29   EEG adult Result Date: 07/31/2023 IMPRESSION: This study is suggestive of cortical dysfunction in right posterior quadrant likely secondary to underlying stroke.  Additionally there is mild diffuse encephalopathy.  No seizures or epileptiform discharges were seen throughout the recording. Arlin MALVA Krebs   MR Brain W and Wo Contrast Result Date: 07/30/2023 IMPRESSION: 1. Acute to early subacute infarct in the right posterior temporal and lateral occipital and parietal cortex, with associated edema and petechial hemorrhage. 2. Additional punctate acute infarcts in the right  cerebellum, left temporal lobe, left occipital lobe, right occipital lobe, right parietal lobe, right frontal lobe, right thalamus, and left frontal lobe. These foci are both in the white matter and cortex. The distribution is suggestive of a central embolic phenomenon. These results will be called to the ordering clinician or representative by the Radiologist Assistant, and communication documented in the PACS or [employer]. Electronically Signed   By: Donald Campion M.D.   On: 07/30/2023 22:55   CT CHEST ABDOMEN PELVIS W CONTRAST Result Date: 07/30/2023 IMPRESSION: 1. No CT evidence of acute traumatic injury to the chest, abdomen, or pelvis. 2. Mild pulmonary fibrosis in  a pattern with apical to basal gradient featuring irregular peripheral interstitial opacity and septal thickening with minimal traction bronchiectasis and minimal areas of subpleural bronchiolectasis of the lung bases. 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 3. Small irregular nodule of the posterior left apex measuring 0.6 cm. Non-contrast chest CT at 6-12 months is recommended. If the nodule is stable at time of repeat CT, then future CT at 18-24 months (from today's scan) is considered optional for low-risk patients, but is recommended for high-risk patients. This recommendation follows the consensus statement: Guidelines for Management of Incidental Pulmonary Nodules Detected on CT Images: From the Fleischner Society 2017; Radiology 2017; 284:228-243. 4. Cirrhosis. 5. 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 6. Sigmoid diverticulosis without evidence of acute diverticulitis. Aortic Atherosclerosis (ICD10-I70.0) and Emphysema (ICD10-J43.9). Electronically Signed   By: Marolyn JONETTA Jaksch M.D.   On: 07/30/2023 14:18   CT HEAD WO CONTRAST Result Date: 07/30/2023 IMPRESSION: 1. Cortical based hypodensity of the right parieto-occipital junction measuring 2.4 x 2.2 cm. This is concerning for acute or  subacute infarct or alternately underlying mass. Recommend MRI for further evaluation. 2. No evidence of hemorrhage or mass effect. 3. Soft tissue contusion of the left scalp vertex. 4. No fracture or static subluxation of the cervical spine. 5. Moderate disc degenerative disease from C5-C7. Electronically Signed   By: Marolyn JONETTA Jaksch M.D.   On: 07/30/2023 14:13   CT CERVICAL SPINE WO CONTRAST Result Date: 07/30/2023 IMPRESSION: 1. Cortical based hypodensity of the right parieto-occipital junction measuring 2.4 x 2.2 cm. This is concerning for acute or subacute infarct or alternately underlying mass. Recommend MRI for further evaluation. 2. No evidence of hemorrhage or mass effect. 3. Soft tissue contusion of the left scalp vertex. 4. No fracture or static subluxation of the cervical spine. 5. Moderate disc degenerative disease from C5-C7. Electronically Signed   By: Marolyn JONETTA Jaksch M.D.   On: 07/30/2023 14:13     Signed: Ozell Riff, MD Internal Medicine Resident, PGY-1 [facility]  Pager: [phone] 6:03 PM, 08/12/2023

## 2023-08-14 ENCOUNTER — Ambulatory Visit (INDEPENDENT_AMBULATORY_CARE_PROVIDER_SITE_OTHER): Payer: Commercial Managed Care - PPO | Admitting: Gastroenterology

## 2023-08-14 ENCOUNTER — Encounter: Payer: Self-pay | Admitting: Gastroenterology

## 2023-08-14 VITALS — BP 132/76 | HR 80 | Ht 65.0 in | Wt 190.2 lb

## 2023-08-14 DIAGNOSIS — Z8673 Personal history of transient ischemic attack (TIA), and cerebral infarction without residual deficits: Secondary | ICD-10-CM

## 2023-08-14 DIAGNOSIS — Z7901 Long term (current) use of anticoagulants: Secondary | ICD-10-CM

## 2023-08-14 DIAGNOSIS — E119 Type 2 diabetes mellitus without complications: Secondary | ICD-10-CM

## 2023-08-14 DIAGNOSIS — E1100 Type 2 diabetes mellitus with hyperosmolarity without nonketotic hyperglycemic-hyperosmolar coma (NKHHC): Secondary | ICD-10-CM

## 2023-08-14 DIAGNOSIS — K7460 Unspecified cirrhosis of liver: Secondary | ICD-10-CM

## 2023-08-14 DIAGNOSIS — I864 Gastric varices: Secondary | ICD-10-CM

## 2023-08-14 DIAGNOSIS — Z794 Long term (current) use of insulin: Secondary | ICD-10-CM

## 2023-08-14 DIAGNOSIS — K55069 Acute infarction of intestine, part and extent unspecified: Secondary | ICD-10-CM

## 2023-08-14 NOTE — Evaluation (Addendum)
 OUTPATIENT PHYSICAL THERAPY NEURO EVALUATION   Patient Name: Maria Rose MRN: [id number] DOB:[birth year], 68 y.o., female Today's Date: 08/15/2023  PCP: N/A REFERRING PROVIDER: Lovie Clarity, MD  END OF SESSION:  PT End of Session - 08/15/23 1503     Visit Number 1    Date for PT Re-Evaluation 10/10/23    PT Start Time 0930    PT Stop Time 1010    PT Time Calculation (min) 40 min    Activity Tolerance Patient tolerated treatment well    Behavior During Therapy [facility] for tasks assessed/performed            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   Past Surgical History:  Procedure Laterality Date   ESOPHAGOGASTRODUODENOSCOPY (EGD) WITH PROPOFOL  N/A 07/31/2023   Procedure: ESOPHAGOGASTRODUODENOSCOPY (EGD) WITH PROPOFOL ;  Surgeon: Stacia Glendia BRAVO, MD;  Location: [facility] ENDOSCOPY;  Service: Gastroenterology;  Laterality: N/A;   HEMOSTASIS CONTROL  07/31/2023   Procedure: HEMOSTASIS CONTROL;  Surgeon: Stacia Glendia BRAVO, MD;  Location: [facility] ENDOSCOPY;  Service: Gastroenterology;;   IR ANGIOGRAM VISCERAL SELECTIVE  08/07/2023   IR ANGIOGRAM VISCERAL SELECTIVE  08/07/2023   IR EMBO VENOUS NOT HEMORR HEMANG  INC GUIDE ROADMAPPING  08/05/2023   IR RENAL SELECTIVE  UNI INC S&I MOD SED  08/07/2023   IR US  GUIDE VASC ACCESS RIGHT  08/05/2023   RADIOLOGY WITH ANESTHESIA N/A 08/05/2023   Procedure: IR WITH ANESTHESIA -TIPS;  Surgeon: Radiologist, Medication, MD;  Location: [facility] OR;  Service: Radiology;  Laterality: N/A;   Patient Active Problem List   Diagnosis Date Noted   Hepatic cirrhosis (HCC) 08/14/2023   Other cirrhosis of liver (HCC) 08/11/2023   Iron  deficiency anemia 08/11/2023   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   Decompensated cirrhosis (HCC) 07/31/2023   Bleeding gastric varices 07/31/2023   Cerebrovascular accident (CVA) (HCC)  07/31/2023   Type 2 diabetes mellitus (HCC) 07/30/2023    ONSET DATE: 08/12/23  REFERRING DIAG: I63.9 (ICD-10-CM) - Stroke (HCC)   THERAPY DIAG:  Difficulty in walking, not elsewhere classified  Muscle weakness (generalized)  Other abnormalities of gait and mobility  Other lack of coordination  Unsteadiness on feet  Cerebrovascular accident (CVA), unspecified mechanism (HCC)  Rationale for Evaluation and Treatment: Rehabilitation  SUBJECTIVE:  SUBJECTIVE STATEMENT: Patient reports recent stroke. She has vision issues and some weakness on her L side. Her knees ache and she has H/O B ankle/foot injuries. Pt accompanied by: self  PERTINENT HISTORY:  Per hospital PT evaluation note: 68 y.o. female presents to [facility] hospital on 07/30/2023 after a fall and shaking episode. 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 Additional punctate acute infarcts in right cerebellum, left temporal lobe, left occipital lobe, right occipital lobe, right parietal lobe, right frontal lobe, right thalamus, left frontal lobe. Pt with bloody bowel movement and hematemesis on 12/24. PMH includes hepatic cirrhosis, gastric and splenic varices, sepsis.   PAIN:  Are you having pain? Yes: NPRS scale: 8/10 Pain location: B knees and bottoms of her feet. Pain description: Chronic Aggravating factors: Cold weather, too much walking. Relieving factors: Has knee brace which helps, was taking Allieve, but had to stop since stroke.  PRECAUTIONS: Fall and Other: L visual field cut  RED FLAGS: None   WEIGHT BEARING RESTRICTIONS: No  FALLS: Has patient fallen in last 6 months? Yes. Number of falls 1  LIVING ENVIRONMENT: Lives  with: lives with their family Lives in: House/apartment Stairs: Yes: Internal: 18 steps; on left going up and External: 1 steps; none Has following equipment at home: Walker - 2 wheeled  PLOF: Independent  PATIENT GOALS: Patient would like to improve her balance, minimize knee pain, return her normal daily activities.  OBJECTIVE:  Note: Objective measures were completed at Evaluation unless otherwise noted.  DIAGNOSTIC FINDINGS: N/A  COGNITION: Overall cognitive status: Within functional limits for tasks assessed   SENSATION: Patient denies change  COORDINATION: Heel slide on shin, WNL  EDEMA:  Patient denies  MUSCLE TONE: WNL  MUSCLE LENGTH: Hamstrings: Right 75 deg; Left 60 deg Thomas test: WNL B.  POSTURE: increased thoracic kyphosis and weight shift right  LOWER EXTREMITY ROM:   WNL B.  LOWER EXTREMITY MMT:  R 5/5  MMT Right Eval Left Eval  Hip flexion  4-  Hip extension  4-  Hip abduction  4-  Hip adduction    Hip internal rotation    Hip external rotation    Knee flexion  4  Knee extension  4  Ankle dorsiflexion  4  Ankle plantarflexion  4-  Ankle inversion    Ankle eversion    (Blank rows = not tested)  BED MOBILITY:  I  TRANSFERS: I  RAMP:  S  CURB:  CGA  STAIRS: Level of Assistance: SBA Stair Negotiation Technique: Step to Pattern with Single Rail on Left Number of Stairs: 18  Height of Stairs: 6  Comments: Patient reports she is stable with a rail  GAIT: Gait pattern: step through pattern, decreased arm swing- Right, decreased arm swing- Left, shuffling, and lateral hip instability Distance walked: in clinic distances Assistive device utilized: None Level of assistance: SBA Comments: Patient reports that she walks with her son or husband when outside of her house for safety.  FUNCTIONAL TESTS:  5 times sit to stand: 18.81 Berg Balance Scale: 39 Functional gait assessment: 16  TREATMENT DATE:  08/15/23 Education    PATIENT EDUCATION: Education details: POC Person educated: Patient Education method: Explanation Education comprehension: verbalized understanding  HOME EXERCISE PROGRAM: TBD  GOALS: Goals reviewed with patient? Yes  SHORT TERM GOALS: Target date: 09/04/23  I with initial HEP Baseline: Goal status: INITIAL  LONG TERM GOALS: Target date: 10/10/23  I with final HEP Baseline:  Goal status: INITIAL  2.  Complete 5 x STS without UE support in < 15 sec Baseline: 18.8, used UE Goal status: INITIAL  3.  Improve BERG score to at least 45/56 to indicate minimal fall risk Baseline: 39 Goal status: INITIAL  4.  Improve FGA score to at least 24/28 Baseline: 16 Goal status: INITIAL  5.  Increase L LE strength and trunk strength to at least 4+/5 to provide better support and help decrease knee pain. Baseline:  Goal status: INITIAL  6. Patient will walk at least 500' on level and unlevel surfaces I with no instability noted.  Baseline: Multiple gait abnormalities and limitations noted.  Goal Status: INITIAL  ASSESSMENT:  CLINICAL IMPRESSION: Patient is a 68 y.o. who was seen today for physical therapy evaluation and treatment S/P R CVA. She presents with mild L sided weakness, decreased balance, decreased safety and I with all functional mobility. She also has a L visual field cut, and chronic B knee pain. She will benefit from PT to address her deficits in order to to improve her safety and I with all mobility, return to her normal daily activities and independence.  OBJECTIVE IMPAIRMENTS: decreased activity tolerance, decreased balance, decreased coordination, decreased endurance, decreased mobility, difficulty walking, decreased strength, and postural dysfunction.   ACTIVITY LIMITATIONS: standing, squatting, stairs, and locomotion level  PARTICIPATION  LIMITATIONS: meal prep, cleaning, laundry, driving, shopping, and community activity  PERSONAL FACTORS: Past/current experiences are also affecting patient's functional outcome.   REHAB POTENTIAL: Good  CLINICAL DECISION MAKING: Stable/uncomplicated  EVALUATION COMPLEXITY: Moderate  PLAN:  PT FREQUENCY: 2x/week  PT DURATION: 10 weeks  PLANNED INTERVENTIONS: 97110-Therapeutic exercises, 97530- Therapeutic activity, [id number]- Neuromuscular re-education, 97535- Self Care, [id number]- Manual therapy, [phone]- Gait training, [phone]- Electrical stimulation (unattended), Patient/Family education, Balance training, Stair training, and Joint mobilization  PLAN FOR NEXT SESSION: Initiate HEP for strength and balance.   Devere CHRISTELLA Mean, DPT 08/15/2023, 3:14 PM

## 2023-08-14 NOTE — Progress Notes (Signed)
 Discussed the use of AI scribe software for clinical note transcription with the patient, who gave verbal consent to proceed.  HPI : Maria Rose is a 68 y.o. female who presents to clinic for follow-up after recent complicated hospitalization, which occurred after having no medical encounters for 10 years.  She was hospitalized from December 23-January 5.   She had presented with worsening headaches, malaise and then a syncopal episode, and was found to have radiographic evidence of scattered acute or subacute infarcts, suggestive of a thromboembolic source of stroke.  In addition the day following admission, she suffered a brisk upper GI bleed associated with profound hypotension and hemoglobin drop from 10 to 5.  She underwent an upper endoscopy which showed grade 1 varices and gastric varices with bleeding stigmata, attributed as the bleeding source.  She underwent BRTO on December 29 and a subsequent CT angiogram showed successful occlusion of the gastric varices with splenorenal shunt, but did show an acute nonocclusive SMV thrombus.  She was started on IV heparin  and transitioned to oral anticoagulation. Prior to this admission, the patient had no idea she had liver disease or cirrhosis.  Evaluation for viral and autoimmune causes of chronic liver disease were negative. The patient also presented with profound hyperglycemia/hyperosmolar state (blood osm 312) with an A1c of 11.  She had no previous diagnosis of diabetes.  She was discharged on insulin . Her cirrhosis was presumed to be secondary to [facility].  Today, the patient reports she is continuing to recover from her hospitalization. She reports feeling well overall, with no recurrence of the severe headaches or loss of consciousness that initially led to hospital admission. She denies any new neurological symptoms, such as balance issues, difficulty with speech or swallowing, focal weakness or paresthesias, but notes general weakness and  fatigue.  Since discharge, she reports no further episodes of bleeding or any changes in stool color. She denies abdominal pain, nausea, or vomiting.  The patient is currently on insulin  injections for diabetes management and Eliquis  for a blood clot in the superior mesenteric vein. She reports no issues with administering the insulin  shots.  The patient acknowledges the diagnosis of cirrhosis and understands it is likely secondary to fatty liver disease from diabetes. She is aware of the risk of liver cancer associated with cirrhosis and will undergo an ultrasound in six months for screening. She is committed to controlling her diabetes and maintaining a healthy lifestyle to limit further liver damage.  The patient is currently living with her son, who is assisting with her care. She reports no issues with fluid accumulation in the abdomen or legs.   She has follow-up scheduled with neurology and cardiology to further evaluate the etiology of her stroke.        Taken from discharge summary  Cryptogenic stroke, likely embolic She presented to the emergency department with a 3-day history of headaches and a syncopal episode.  CT head showed a cortical hypodensity in the right parieto-occipital junction concerning for an acute or subacute infarct.  MRI brain demonstrated acute to early subacute infarcts in the right posterior temporal and lateral occipital and parietal cortex, in addition to punctate acute infarcts in the right cerebellum, left temporal lobe, left occipital lobe, right occipital lobe, right parietal lobe, right frontal lobe, right thalamus, and left frontal lobe. This scattered distribution is suggestive of an embolic phenomenon.  CTA of the head demonstrated some intracranial atherosclerosis with mild bilateral ICA stenosis, as well as cervical carotid atherosclerosis  without significant stenosis. She received a TTE, which was limited by poor windows, but did not demonstrate any  thrombus.  Anticoagulation was held in the setting of her GI bleeding.  Neurology recommended a TEE to evaluate for a cardioembolic source of the stroke, however cardiology declined to move forward with the procedure in the setting of her esophageal varices.  They instead recommended an outpatient repeat TTE or cardiac CT if there is high concern of a cardiac thrombus.    Given her cryptogenic stoke and acute venous thromboembolism, we discharged her with 3 months of Eliquis .  We have also set up ambulatory cardiac monitoring for 30 days to monitor for paroxysmal arrhythmias.  She will need outpatient follow-up with cardiology and with the stroke clinic at [facility] in 4 weeks.   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  Shortly thereafter, she became hypotensive with her systolic blood pressures into the 80s and a drop in her hemoglobin from 10.3-5.3.  Lactic acid climbed from 2.8-8.7.  She was admitted to the ICU for management and was given 2 units packed red blood cells and 1 L normal saline bolus with improvement of her hemodynamic status.  She had no history of previous GI bleeds, alcohol use, or antiplatelets/anticoagulation.  She was started on empiric broad-spectrum antibiotics, octreotide , IV PPI, and GI was consulted for evaluation. 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  3 mL of hemostatic gel was applied to the varix. IR was consulted for BRTO, which was completed on 12/29.  Postprocedure CT angiogram showed successful occlusion of gastric varices with splenorenal shunt, but did demonstrate an acute, non-occlusive superior mesenteric vein mural thrombus.  She was started on  IV heparin  for VTE treatment and was reevaluated by interventional radiology. She was transitioned to oral anticoagulation and will follow up in 6 weeks for a repeat EGD with GI. She will also follow up with IR outpatient in 4 weeks.    Thrombocytopenia, in the setting of cirrhosis Platelets were low on admission in the 80s, improved to 130s over the course of the hospitalization.  Likely secondary to her newly discovered cirrhosis.    Microcytic anemia, secondary to iron  deficiency in the setting of GI bleeding She had microcytic anemia in the setting of an acute GI bleed.  An iron  panel demonstrated significant iron  deficiency, which may also be secondary to this acute bleed.  With her bleeding resolved, she may need oral iron  repletion outpatient if her anemia and iron  deficiency persists.    Acute SMV thrombus On 1/1, a post procedural CT angiogram demonstrated a new non-occlusive superior mesenteric vein mural thrombus.  She was asymptomatic.  She was started on IV heparin  and monitored closely for any signs of rebleeding. She tolerated it well and was transitioned to oral eliquis . She will follow up with IR outpatient.    T2DM Her hemoglobin A1c was 11.4 on admission. She had not seen a doctor in 10 years.  Her fasting blood sugars have remained elevated, so we have titrated up her long-acting insulin  to 30 units daily. She received inpatient education from our diabetes education team and an outpatient diabetes education consult was also placed. She will need close outpatient  follow-up.   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 Needs outpatient pulmonology follow-up with Dr. Geronimo or Dr. Theophilus and CT chest in 6 months.  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     Past Surgical History:  Procedure Laterality Date    ESOPHAGOGASTRODUODENOSCOPY (EGD) WITH PROPOFOL  N/A 07/31/2023   Procedure: ESOPHAGOGASTRODUODENOSCOPY (EGD) WITH PROPOFOL ;  Surgeon: Stacia Glendia BRAVO, MD;  Location: [facility] ENDOSCOPY;  Service: Gastroenterology;  Laterality: N/A;   HEMOSTASIS CONTROL  07/31/2023   Procedure: HEMOSTASIS CONTROL;  Surgeon: Stacia Glendia BRAVO, MD;  Location: [facility] ENDOSCOPY;  Service: Gastroenterology;;   IR ANGIOGRAM VISCERAL SELECTIVE  08/07/2023   IR ANGIOGRAM VISCERAL SELECTIVE  08/07/2023   IR EMBO VENOUS NOT HEMORR HEMANG  INC GUIDE ROADMAPPING  08/05/2023   IR RENAL SELECTIVE  UNI INC S&I MOD SED  08/07/2023   IR US  GUIDE VASC ACCESS RIGHT  08/05/2023   RADIOLOGY WITH ANESTHESIA N/A 08/05/2023   Procedure: IR WITH ANESTHESIA -TIPS;  Surgeon: Radiologist, Medication, MD;  Location: [facility] OR;  Service: Radiology;  Laterality: N/A;   No family history on file. Social History   Tobacco Use   Smoking status: Every Day   Smokeless tobacco: Never  Substance Use Topics   Alcohol use: Yes   Current Outpatient Medications  Medication Sig Dispense Refill   apixaban  (ELIQUIS ) 5 MG TABS tablet Take 1 tablet (5 mg total) by mouth 2 (two) times daily. 60 tablet 0   [START ON 09/12/2023] apixaban  (ELIQUIS ) 5 MG TABS tablet Take 1 tablet (5 mg total) by mouth 2 (two) times daily. 120 tablet 0   Blood Glucose Monitoring Suppl DEVI 1 each by Does not apply route 3 (three) times daily. May dispense any manufacturer covered by patient's insurance. 1 each 2   carvedilol  (COREG ) 3.125 MG tablet Take 1 tablet (3.125 mg total) by mouth 2 (two) times daily with a meal. 60 tablet 1   clotrimazole  (LOTRIMIN ) 1 % cream Apply to affected area 2 times daily 60 g 1   erythromycin ophthalmic ointment Place 1 Application into the left eye 4 (four) times daily.     Glucagon , rDNA, (GLUCAGON  EMERGENCY) 1 MG KIT Inject 1 mg into the skin as needed for up to 2 doses (Severe low blood sugar). 1 kit 1   Glucose Blood (BLOOD GLUCOSE TEST STRIPS)  STRP 1 each by Does not apply route 3 (three) times daily. Use as directed to check blood sugar. May dispense any manufacturer covered by patient's insurance and fits patient's device. 90 each 2   Insulin  Glargine (BASAGLAR  KWIKPEN) 100 UNIT/ML Inject 30 Units into the skin daily. May substitute as needed per insurance. 15 mL 1   Insulin  Pen Needle (PEN NEEDLES) 31G X 5 MM MISC 1 each by Does not apply route 3 (three) times daily. May dispense any manufacturer covered by patient's insurance. 90 each 3   Lancet Device MISC 1 each by Does not apply route 3 (three) times daily. May dispense any manufacturer covered by patient's insurance. 1 each 3   Lancets MISC 1 each by Does not apply route 3 (three) times daily. Use as directed to check blood sugar. May dispense any manufacturer covered by patient's insurance and fits patient's device. 1 each 2   mupirocin  ointment (BACTROBAN ) 2 % Apply 1 Application topically 2 (two) times  daily.     pantoprazole  (PROTONIX ) 40 MG tablet Take 1 tablet (40 mg total) by mouth 2 (two) times daily for 14 days, THEN 1 tablet (40 mg total) daily. 58 tablet 0   No current facility-administered medications for this visit.   No Known Allergies   Review of Systems: All systems reviewed and negative except where noted in HPI.    CT ANGIO ABD/PELVIS BRTO Result Date: 08/08/2023 CLINICAL DATA:  Gastric varices, post BRTO EXAM: CTA ABDOMEN AND PELVIS WITHOUT AND WITH CONTRAST TECHNIQUE: Multidetector CT imaging of the abdomen and pelvis was performed using the standard protocol during bolus administration of intravenous contrast. Multiplanar reconstructed images and MIPs were obtained and reviewed to evaluate the vascular anatomy. RADIATION DOSE REDUCTION: This exam was performed according to the departmental dose-optimization program which includes automated exposure control, adjustment of the mA and/or kV according to patient size and/or use of iterative reconstruction  technique. CONTRAST:  OMNIPAQUE  IOHEXOL  350 MG/ML SOLN COMPARISON:  08/01/2023 FINDINGS: VASCULAR Aorta: Moderate partially calcified atheromatous plaque without aneurysm, dissection, or stenosis. Mural thrombus in the suprarenal segment as before, potential embolic risk. Celiac: Patent without evidence of aneurysm, dissection, vasculitis or significant stenosis. SMA: Patent without evidence of aneurysm, dissection, vasculitis or significant stenosis. Renals: Both renal arteries are patent without evidence of aneurysm, dissection, vasculitis, fibromuscular dysplasia or significant stenosis. IMA: Patent without evidence of aneurysm, dissection, vasculitis or significant stenosis. Inflow: Scattered nonocclusive partially calcified plaque in bilateral common and internal iliac arteries. Mild eccentric plaque in the mid left external iliac artery with only mild stenosis. Proximal Outflow: Mildly atheromatous, patent. Veins: Residual embolic material in gastric varices and splenorenal shunt, consistent with successful occlusion. Short-segment focus of eccentric nonocclusive mural thrombus in the SMV, new since previous. No embolic material in the portal venous system or liver. Patent hepatic veins, portal vein, splenic vein, IMV, bilateral renal veins. Incomplete opacification of iliac venous system and IVC attributed to scan timing. Review of the MIP images confirms the above findings. NON-VASCULAR Lower chest: No pleural or pericardial effusion. Chronic interstitial changes in the visualized lung bases. Hepatobiliary: Mild nodular contour. No focal lesion or biliary ductal dilatation. Gallbladder unremarkable. Pancreas: Unremarkable. No pancreatic ductal dilatation or surrounding inflammatory changes. Spleen: Stable mild splenomegaly without focal lesion. Adrenals/Urinary Tract: No adrenal mass. Symmetric renal parenchymal enhancement without focal lesion, urolithiasis, or hydronephrosis. Urinary bladder  partially distended, containing small gas bubble presumably from recent instrumentation. Stomach/Bowel: Stomach is nondistended with embolic material and previously demonstrated gastric varices. Small bowel decompressed. Normal appendix. Colon partially distended with scattered descending and sigmoid diverticula; no adjacent inflammatory change. Lymphatic: No abdominal or pelvic adenopathy. Reproductive: Uterus and bilateral adnexa are unremarkable. Other: No ascites.  No free air. Musculoskeletal: Mild lumbar levoscoliosis apex L3 with multilevel spondylitic change. 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 IMPRESSION: 1. Successful occlusion of gastric varices and splenorenal shunt. 2. Short-segment focus of eccentric nonocclusive mural thrombus in the SMV, new since previous. 3.   Colonic diverticulosis. 4.  Aortic Atherosclerosis (ICD10-I70.0). Electronically Signed   By: JONETTA Faes M.D.   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 EXAM: 1. Ultrasound-guided puncture right common femoral vein 2. Catheterization of the left renal vein with venogram 3. Catheterization of the common origin of the gastro renal shunt and left  adrenal vein with venogram 4. Catheterization of the gastro renal shunt with venogram 5. Catheterization of inferior phrenic vein with venogram 6. Coil embolization of left inferior phrenic vein 7. Combined coil and foam embolization of gastric varices. MEDICATIONS: As antibiotic prophylaxis, 2 g Rocephin  was ordered pre-procedure and administered intravenously within one hour of incision. ANESTHESIA/SEDATION: General - as administered by the Anesthesia department CONTRAST:  35 mL Omnipaque  300 FLUOROSCOPY: Radiation Exposure Index (as provided by the fluoroscopic  device): 427 mGy Kerma COMPLICATIONS: None immediate. PROCEDURE: Informed written consent was obtained from the patient after a thorough discussion of the procedural risks, benefits and alternatives. All questions were addressed. Maximal Sterile Barrier Technique was utilized including caps, mask, sterile gowns, sterile gloves, sterile drape, hand hygiene and skin antiseptic. A timeout was performed prior to the initiation of the procedure. The right common femoral vein was interrogated with ultrasound and found to be widely patent. An image was obtained and stored for the medical record. Local anesthesia was attained by infiltration with 1% lidocaine . A small dermatotomy was made. Under real-time sonographic guidance, the vessel was punctured with a 21 gauge micropuncture needle. Using standard technique, the initial micro needle was exchanged over a 0.018 micro wire for a transitional 4 French micro sheath. The micro sheath was then exchanged over a 0.035 wire for a 10 French fascial dilator. The percutaneous tract was dilated to 10 French. A Cook 10 French flexor sheath was advanced over the wire and into the IVC. A C2 cobra catheter was advanced over the Bentson wire and used to select the left renal vein. The catheter was directed into the lower pole of the left renal vein and venography was performed. Widely patent left renal vein. Reflux down the gonadal vein noted incidentally. A rose in wire was advanced into the lower pole renal vein. The C2 catheter was removed. The sheath was pulled back over the wire while contrast was administered in till the origin of the gastro renal shunt and adrenal vein was identified (PRES technique). A 65 cm angled 5 French glide catheter was introduced coaxially through the 10 French sheath over a glidewire. The catheter was advanced into the common origin of the left adrenal vein and gastro renal shunt. Contrast was injected identifying the anatomy. The catheter and wire were  carefully manipulated into the gastro renal shunt. Additional venography was performed confirming catheter location within the gastro renal shunt and no further filling of the left adrenal vein. A superstiff Amplatz wire was then advanced and deployed. The angled catheter was removed. The Rosen wire was removed. The 10 French sheath was carefully advanced over the superstiff Amplatz wire into the common origin of the gastro renal shunt and left adrenal vein. A [employer] standard occlusion balloon was then advanced over the wire coaxially through the sheath and into the gastro renal shunt. The occlusion balloon was then inflated and a balloon occluded venogram was performed of the gastro renal shunt and gastric varices. 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 There is a single escape veins via the left inferior phrenic vein. The inflow afferent vein appears to be the posterior gastric vein. A lantern microcatheter was advanced through the standard balloon occlusion catheter over a Fathom 16 wire. The catheter was advanced into the inferior phrenic vein. Contrast was injected confirming catheter location and anatomy. Coil embolization was then performed using a single 1 x 5 mm penumbra detachable coil. The microcatheter was next advanced deeper into the gastro renal shunt.  Contrast was injected with balloon inflated and deflated in till the anatomy of the gastro renal shunt and gastric varices was well evaluated. The balloon was taken down and all contrast allowed to washout. The occlusion balloon was then reinflated. A foam sclerosant comprised of 6 mL air 4 mL 3% sodium tetradecyl Sulfate  and 2 mL Lipiodol was then prepared in the standard fashion. The sclerosant was slowly injected until there was complete filling of the gastric varices and the phone just started to enter the posterior gastric vein. The catheter was then gently flushed. The microcatheter was brought back toward the occlusion balloon and  the gastro renal shunt was coil embolized with a series of penumbra detachable microcoils. Once successful coil embolization was performed the occlusion balloon was slowly deflated under real-time fluoroscopy. No evidence of residual flow or mobilization of the foam. As the balloon occlusion catheter was pulled back, gentle injections of contrast material demonstrate patency of the left adrenal vein and the left renal vein. No evidence of complication. The catheter and sheath were removed and hemostasis attained by gentle manual pressure. IMPRESSION: Successful balloon occluded retrograde transvenous obliteration of gastric varices with coil embolization of the gastro renal shunt and left inferior phrenic vein. PLAN: 1. Continue to trend H and H and transfuse as needed. 2. BRTO protocol CT scan of the abdomen with and without contrast in 48 hours to assess gastric varices. 3. Patient will need follow-up upper endoscopy with GI in approximately 6 weeks to assess for enlarging esophageal varices. 4. Patient to follow-up with Interventional Radiology in 3 months with repeat BRTO protocol CT scan of the abdomen with and without contrast. Electronically Signed   By: Wilkie Lent M.D.   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 EXAM: 1. Ultrasound-guided puncture right common femoral vein 2. Catheterization of the left renal vein with venogram 3. Catheterization of the common origin of the gastro renal shunt and left adrenal vein with venogram 4. Catheterization of the gastro renal shunt with venogram 5. Catheterization of inferior phrenic vein with venogram 6. Coil embolization of left inferior phrenic vein 7. Combined coil and foam embolization of gastric varices. MEDICATIONS: As antibiotic prophylaxis, 2 g Rocephin  was  ordered pre-procedure and administered intravenously within one hour of incision. ANESTHESIA/SEDATION: General - as administered by the Anesthesia department CONTRAST:  35 mL Omnipaque  300 FLUOROSCOPY: Radiation Exposure Index (as provided by the fluoroscopic device): 427 mGy Kerma COMPLICATIONS: None immediate. PROCEDURE: Informed written consent was obtained from the patient after a thorough discussion of the procedural risks, benefits and alternatives. All questions were addressed. Maximal Sterile Barrier Technique was utilized including caps, mask, sterile gowns, sterile gloves, sterile drape, hand hygiene and skin antiseptic. A timeout was performed prior to the initiation of the procedure. The right common femoral vein was interrogated with ultrasound and found to be widely patent. An image was obtained and stored for the medical record. Local anesthesia was attained by infiltration with 1% lidocaine . A small dermatotomy was made. Under real-time sonographic guidance, the vessel was punctured with a 21 gauge micropuncture needle. Using standard technique, the initial micro needle was exchanged over a 0.018 micro wire for a transitional 4 French micro sheath. The micro sheath was then exchanged over a 0.035 wire for a 10 French fascial dilator. The percutaneous tract was dilated to 10 French.  A Cook 10 French flexor sheath was advanced over the wire and into the IVC. A C2 cobra catheter was advanced over the Bentson wire and used to select the left renal vein. The catheter was directed into the lower pole of the left renal vein and venography was performed. Widely patent left renal vein. Reflux down the gonadal vein noted incidentally. A rose in wire was advanced into the lower pole renal vein. The C2 catheter was removed. The sheath was pulled back over the wire while contrast was administered in till the origin of the gastro renal shunt and adrenal vein was identified (PRES technique). A 65 cm angled 5 French  glide catheter was introduced coaxially through the 10 French sheath over a glidewire. The catheter was advanced into the common origin of the left adrenal vein and gastro renal shunt. Contrast was injected identifying the anatomy. The catheter and wire were carefully manipulated into the gastro renal shunt. Additional venography was performed confirming catheter location within the gastro renal shunt and no further filling of the left adrenal vein. A superstiff Amplatz wire was then advanced and deployed. The angled catheter was removed. The Rosen wire was removed. The 10 French sheath was carefully advanced over the superstiff Amplatz wire into the common origin of the gastro renal shunt and left adrenal vein. A [employer] standard occlusion balloon was then advanced over the wire coaxially through the sheath and into the gastro renal shunt. The occlusion balloon was then inflated and a balloon occluded venogram was performed of the gastro renal shunt and gastric varices. 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 There is a single escape veins via the left inferior phrenic vein. The inflow afferent vein appears to be the posterior gastric vein. A lantern microcatheter was advanced through the standard balloon occlusion catheter over a Fathom 16 wire. The catheter was advanced into the inferior phrenic vein. Contrast was injected confirming catheter location and anatomy. Coil embolization was then performed using a single 1 x 5 mm penumbra detachable coil. The microcatheter was next advanced deeper into the gastro renal shunt. Contrast was injected with balloon inflated and deflated in till the anatomy of the gastro renal shunt and gastric varices was well evaluated. The balloon was taken down and all contrast allowed to washout. The occlusion balloon was then reinflated. A foam sclerosant comprised of 6 mL air 4 mL 3% sodium tetradecyl Sulfate  and 2 mL Lipiodol was then prepared in the standard fashion. The  sclerosant was slowly injected until there was complete filling of the gastric varices and the phone just started to enter the posterior gastric vein. The catheter was then gently flushed. The microcatheter was brought back toward the occlusion balloon and the gastro renal shunt was coil embolized with a series of penumbra detachable microcoils. Once successful coil embolization was performed the occlusion balloon was slowly deflated under real-time fluoroscopy. No evidence of residual flow or mobilization of the foam. As the balloon occlusion catheter was pulled back, gentle injections of contrast material demonstrate patency of the left adrenal vein and the left renal vein. No evidence of complication. The catheter and sheath were removed and hemostasis attained by gentle manual pressure. IMPRESSION: Successful balloon occluded retrograde transvenous obliteration of gastric varices with coil embolization of the gastro renal shunt and left inferior phrenic vein. PLAN: 1. Continue to trend H and H and transfuse as needed. 2. BRTO protocol CT scan of the abdomen with and without contrast in 48 hours to assess gastric  varices. 3. Patient will need follow-up upper endoscopy with GI in approximately 6 weeks to assess for enlarging esophageal varices. 4. Patient to follow-up with Interventional Radiology in 3 months with repeat BRTO protocol CT scan of the abdomen with and without contrast. Electronically Signed   By: Wilkie Lent M.D.   On: 08/05/2023 14:08   CT ANGIO ABD/PELVIS BRTO Result Date: 08/01/2023 CLINICAL DATA:  Gastric varices, with liver cirrhosis on the recent CT. EXAM: CTA ABDOMEN AND PELVIS WITHOUT AND WITH CONTRAST TECHNIQUE: Multidetector CT imaging of the abdomen and pelvis was performed using the standard protocol during bolus administration of intravenous contrast. Multiplanar reconstructed images and MIPs were obtained and reviewed to evaluate the vascular anatomy. RADIATION DOSE  REDUCTION: This exam was performed according to the departmental dose-optimization program which includes automated exposure control, adjustment of the mA and/or kV according to patient size and/or use of iterative reconstruction technique. CONTRAST:  75mL OMNIPAQUE  IOHEXOL  350 MG/ML SOLN COMPARISON:  CT chest, abdomen and pelvis with IV contrast, done for blunt polytrauma, yesterday at 1:29 p.m. FINDINGS: VASCULAR Aorta: There are moderate to heavy aortic mixed plaques without aneurysm, stenosis or dissection, or penetrating aortic ulcer. Celiac: Patent without evidence of aneurysm, dissection, vasculitis or significant stenosis. SMA: 40% non flow-limiting soft plaque vessel origin stenosis. The vessel otherwise opacifies well. No branch occlusion. Renals: There are calcifications at both renal artery origins but no flow-limiting stenosis, aneurysm or dissection. Both renal arteries are single. IMA: There is a 75% mixed plaque vessel origin stenosis. The vessel otherwise opacify as well. Inflow: There are moderate calcific plaques especially of the common iliac and internal iliac arteries, but no flow-limiting stenosis. Proximal Outflow: There are calcific/mixed plaques in both common femoral arteries, right proximal superficial femoral artery, but no flow-limiting stenosis of the visualized outflow arteries. Veins: Patent. Mild prominence of the hepatic portal main vein measuring 14.5 mm. No deep pelvic vein or IVC filling defects. In the venous phase, venous varices are again shown along the posterior proximal gastric wall, with splenorenal varices posterior to the. There is no recanalization of the umbilical vein. There are no distal esophageal varices. Review of the MIP images confirms the above findings. NON-VASCULAR Lower chest: There is subpleural reticulation and scattered subpleural bronchiolectasis in the lung bases with mild peripheral faint ground-glass disease, findings consistent with chronic  interstitial lung disease. The cardiac size is normal. The inferior mitral ring is heavily calcified. No pericardial effusion. The cardiac blood pool is less dense than the myocardium suggesting anemia. Hepatobiliary: 20 cm in length, mildly steatotic, with nodular capsular contour consistent with cirrhosis. No enhancing liver masses seen. There is excreted contrast filling the gallbladder. There are no stones or wall thickening but there is interval new pericholecystic edema and trace pericholecystic fluid. In this case it is probably congestive or related to fluid overload given the hepatic cirrhosis, but please correlate clinically for acalculous cholecystitis. There is no bile duct dilatation. Pancreas: No abnormality. Spleen: Mildly prominent spleen 13.8 cm craniocaudal. No focal lesion. Mildly engorged splenic vein. Adrenals/Urinary Tract: No abnormality. Stomach/Bowel: Contracted stomach, unremarkable apart from the gastric varices. Normal caliber small intestine and appendix. Mild wall thickening has developed in the ascending, transverse and proximal descending colon. There is sigmoid diverticulosis but no evidence of diverticulitis. The wall thickening could be congestive, related to hepatic dysfunction, or due to colitis. Stranding changes have also developed over portions of the thickened colon. No vascular contrast extravasation is seen into the GI tract. Lymphatic:  No adenopathy. Reproductive: Uterus and bilateral adnexa are unremarkable. Other: No abdominal wall hernia or abnormality. No abdominopelvic ascites. Musculoskeletal: Levoscoliosis and advanced degenerative change lumbar spine. Most advanced at L1-2 through L3-4 with acquired spinal stenosis L3-4 and L4-5. There is osteopenia and additional degenerative change in the thoracic spine. IMPRESSION: 1. Aortic and branch vessel atherosclerosis without aneurysm, dissection or flow-limiting aortic stenosis. 2. 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 3. 75% mixed plaque stenosis of the IMA. 4. Chronic interstitial lung disease in the lung bases. 5. Likely anemia. 6. Hepatic cirrhosis, with mildly prominent hepatic portal main vein and splenic vein, and gastric and splenorenal varices. No recanalization of the umbilical vein or distal esophageal varices. 7. Mildly prominent spleen. 8. Interval development of pericholecystic edema and trace pericholecystic fluid. In this case it is probably congestive or related to fluid overload given the hepatic cirrhosis, but please correlate clinically for acalculous cholecystitis. 9. Interval development of mild wall thickening in the ascending, transverse and proximal descending colon, with stranding changes over portions of the thickened colon. This could be congestive, related to hepatic dysfunction, or due to colitis. 10. Diverticulosis without evidence of diverticulitis. 11. Osteopenia, lumbar scoliosis, and degenerative change. Aortic Atherosclerosis (ICD10-I70.0). Electronically Signed   By: Francis Quam M.D.   On: 08/01/2023 07:24   DG Chest Port 1 View Result Date: 08/01/2023 CLINICAL DATA:  [id number] with respiratory failure. EXAM: PORTABLE CHEST 1 VIEW COMPARISON:  Chest CT 07/30/2023 FINDINGS: The cardiomediastinal silhouette is normal. There is aortic atherosclerosis. There is subpleural reticulation with a basal gradient. No active infiltrate is seen. No substantial pleural effusion. Osteopenia with no acute skeletal findings. No consolidation or other change in overall aeration is seen. IMPRESSION: 1. No evidence of acute chest disease. 2. Aortic atherosclerosis. 3. Subpleural reticulation with a basal gradient. Electronically Signed   By: Francis Quam M.D.   On: 08/01/2023 06:57   ECHOCARDIOGRAM COMPLETE BUBBLE STUDY Result Date: 07/31/2023    ECHOCARDIOGRAM REPORT   Patient Name:   [name] Date of Exam: 07/31/2023 Medical Rec #:  [id number]       Height:       66.0 in  Accession #:    [id number]      Weight:       180.0 lb Date of Birth:  30-Apr-1956        BSA:          1.912 m Patient Age:    67 years        BP:           124/51 mmHg Patient Gender: F               HR:           105 bpm. Exam Location:  Inpatient Procedure: 2D Echo, Cardiac Doppler, Color Doppler and Saline Contrast Bubble            Study Indications:    Stroke  History:        Patient has no prior history of Echocardiogram examinations.                 Risk Factors:Current Smoker.  Sonographer:    Ozell Free Referring Phys: [id number] JEFFREY C HATCHER  Sonographer Comments: Technically challenging study due to limited acoustic windows. Image acquisition challenging due to patient body habitus and Image acquisition challenging due to uncooperative patient. IMPRESSIONS  1. Left ventricular ejection fraction, by estimation, is 60 to 65%. The  left ventricle has normal function. The left ventricle has no regional wall motion abnormalities. Left ventricular diastolic parameters are consistent with Grade I diastolic dysfunction (impaired relaxation).  2. Right ventricular systolic function is normal. The right ventricular size is normal.  3. Suboptimal bubble study- no shunting seen.  4. The mitral valve is degenerative. No evidence of mitral valve regurgitation.  5. The aortic valve was not well visualized. Aortic valve regurgitation is not visualized. No aortic stenosis is present. Comparison(s): No prior Echocardiogram. FINDINGS  Left Ventricle: Left ventricular ejection fraction, by estimation, is 60 to 65%. The left ventricle has normal function. The left ventricle has no regional wall motion abnormalities. The left ventricular internal cavity size was normal in size. There is  no left ventricular hypertrophy. Left ventricular diastolic parameters are consistent with Grade I diastolic dysfunction (impaired relaxation). Right Ventricle: The right ventricular size is normal. No increase in right ventricular wall  thickness. Right ventricular systolic function is normal. Left Atrium: Left atrial size was normal in size. Right Atrium: Right atrial size was normal in size. Pericardium: There is no evidence of pericardial effusion. Presence of epicardial fat layer. Mitral Valve: The mitral valve is degenerative in appearance. Mild mitral annular calcification. No evidence of mitral valve regurgitation. MV peak gradient, 4.0 mmHg. The mean mitral valve gradient is 2.0 mmHg. Tricuspid Valve: The tricuspid valve is grossly normal. Tricuspid valve regurgitation is not demonstrated. Aortic Valve: The aortic valve was not well visualized. Aortic valve regurgitation is not visualized. No aortic stenosis is present. Aortic valve mean gradient measures 5.0 mmHg. Aortic valve peak gradient measures 9.9 mmHg. Aortic valve area, by VTI measures 2.26 cm. Pulmonic Valve: The pulmonic valve was not well visualized. Pulmonic valve regurgitation is not visualized. No evidence of pulmonic stenosis. Aorta: The aortic root is normal in size and structure. IAS/Shunts: The interatrial septum was not well visualized. Agitated saline contrast was given intravenously to evaluate for intracardiac shunting.  LEFT VENTRICLE PLAX 2D LVIDd:         5.00 cm   Diastology LVIDs:         3.30 cm   LV e' medial:    7.77 cm/s LV PW:         1.10 cm   LV E/e' medial:  10.4 LV IVS:        1.10 cm   LV e' lateral:   6.53 cm/s LVOT diam:     2.00 cm   LV E/e' lateral: 12.3 LV SV:         53 LV SV Index:   28 LVOT Area:     3.14 cm  RIGHT VENTRICLE RV S prime:     15.90 cm/s TAPSE (M-mode): 2.7 cm LEFT ATRIUM             Index LA diam:        4.30 cm 2.25 cm/m LA Vol (A2C):   37.1 ml 19.40 ml/m LA Vol (A4C):   53.2 ml 27.82 ml/m LA Biplane Vol: 44.0 ml 23.01 ml/m  AORTIC VALVE AV Area (Vmax):    2.28 cm AV Area (Vmean):   2.13 cm AV Area (VTI):     2.26 cm AV Vmax:           157.00 cm/s AV Vmean:          103.000 cm/s AV VTI:            0.234 m AV Peak Grad:  9.9 mmHg AV Mean Grad:      5.0 mmHg LVOT Vmax:         114.00 cm/s LVOT Vmean:        69.800 cm/s LVOT VTI:          0.168 m LVOT/AV VTI ratio: 0.72  AORTA Ao Root diam: 3.40 cm MITRAL VALVE MV Area (PHT): 4.21 cm     SHUNTS MV Area VTI:   2.96 cm     Systemic VTI:  0.17 m MV Peak grad:  4.0 mmHg     Systemic Diam: 2.00 cm MV Mean grad:  2.0 mmHg MV Vmax:       1.00 m/s MV Vmean:      70.0 cm/s MV Decel Time: 180 msec MV E velocity: 80.60 cm/s MV A velocity: 113.00 cm/s MV E/A ratio:  0.71 Stanly Leavens MD Electronically signed by Stanly Leavens MD Signature Date/Time: 07/31/2023/2:39:51 PM    Final    CT ANGIO HEAD NECK W WO CM Result Date: 07/31/2023 CLINICAL DATA:  Stroke/TIA, determine embolic source. EXAM: CT ANGIOGRAPHY HEAD AND NECK WITH AND WITHOUT CONTRAST TECHNIQUE: Multidetector CT imaging of the head and neck was performed using the standard protocol during bolus administration of intravenous contrast. Multiplanar CT image reconstructions and MIPs were obtained to evaluate the vascular anatomy. Carotid stenosis measurements (when applicable) are obtained utilizing NASCET criteria, using the distal internal carotid diameter as the denominator. RADIATION DOSE REDUCTION: This exam was performed according to the departmental dose-optimization program which includes automated exposure control, adjustment of the mA and/or kV according to patient size and/or use of iterative reconstruction technique. CONTRAST:  75mL OMNIPAQUE  IOHEXOL  350 MG/ML SOLN COMPARISON:  Head CT and MRI 07/30/2023 FINDINGS: CT HEAD FINDINGS Brain: Progressively well-defined cytotoxic edema is present at the junction of the right temporal, occipital, and parietal lobes corresponding to the known acute/early subacute infarct and extending over an area of 4.5-5 cm in diameter, possibly with trace associated petechial hemorrhage. The additional scattered punctate acute cerebral and cerebellar infarcts on MRI are occult  by CT. No space-occupying parenchymal hematoma, mass, midline shift, or extra-axial fluid collection is identified. There is a background of mild chronic small vessel ischemia in the cerebral white matter. The ventricles are normal in size. Vascular: Calcified atherosclerosis at the skull base. Skull: No acute fracture or suspicious osseous lesion. Sinuses/Orbits: Paranasal sinuses and mastoid air cells are clear. Unremarkable orbits. Other: None. Review of the MIP images confirms the above findings CTA NECK FINDINGS Aortic arch: Standard branching with a moderate amount of mixed soft and calcified atherosclerotic plaque. No significant stenosis of the arch vessel origins. Right carotid system: Patent with predominantly calcified plaque at the carotid bifurcation. No definite evidence of a significant stenosis allowing for streak artifact. Left carotid system: Patent with mixed calcified and soft plaque at the carotid bifurcation. No evidence of a significant stenosis. Vertebral arteries: Patent without evidence of a significant stenosis or dissection. Skeleton: Moderate cervical spondylosis. Other neck: No evidence of cervical lymphadenopathy or mass. Upper chest: More fully evaluated on yesterday's chest CT. Review of the MIP images confirms the above findings CTA HEAD FINDINGS Anterior circulation: The internal carotid arteries are patent from skull base to carotid termini with calcified plaque resulting in mild left cavernous and bilateral paraclinoid stenoses. ACAs and MCAs are patent without evidence of a significant proximal stenosis. There is a mildly decreased number of distal right MCA branch vessels in the region of the dominant recent infarct, however  no proximal branch occlusion is evident. No aneurysm is identified. Posterior circulation: The intracranial vertebral arteries are patent to the basilar with the left being mildly dominant. There is mild atherosclerotic irregularity of both V4 segments  without significant stenosis. Patent left PICA, right AICA, and bilateral SCA origins are visualized. The basilar artery is widely patent. Posterior communicating arteries are diminutive or absent. Both PCAs are patent without evidence of a significant proximal stenosis. No aneurysm is identified. Venous sinuses: Patent. Anatomic variants: None. Review of the MIP images confirms the above findings IMPRESSION: 1. No large vessel occlusion. 2. Intracranial atherosclerosis with mild bilateral ICA stenoses. 3. Cervical carotid atherosclerosis without significant stenosis. 4.  Aortic Atherosclerosis (ICD10-I70.0). Electronically Signed   By: Dasie Hamburg M.D.   On: 07/31/2023 11:29   EEG adult Result Date: 07/31/2023 Shelton Arlin KIDD, MD     07/31/2023 10:18 AM Patient Name: MAALIYAH ADOLPH MRN: [id number] Epilepsy Attending: Arlin KIDD Shelton Referring Physician/Provider: Voncile Isles, MD Date: 07/31/2023 Duration: 28.18 mins Patient history: 68yo F with an episode of altered mental status getting eeg to evaluate for seizure Level of alertness: Awake, asleep AEDs during EEG study: None Technical aspects: This EEG study was done with scalp electrodes positioned according to the 10-20 International system of electrode placement. Electrical activity was reviewed with band pass filter of 1-70Hz , sensitivity of 7 uV/mm, display speed of 10mm/sec with a 60Hz  notched filter applied as appropriate. EEG data were recorded continuously and digitally stored.  Video monitoring was available and reviewed as appropriate. Description: The posterior dominant rhythm consists of 9 Hz activity of moderate voltage (25-35 uV) seen predominantly in posterior head regions, asymmetric ( right<left) and reactive to eye opening and eye closing. Sleep was characterized by vertex waves, sleep spindles (12 to 14 Hz), maximal frontocentral region.  EEG showed continuous 3-5hz  theta- delta slowing in right posterior quadrant.  Intermittent  generalized 3-5Hz  theta- delta slowing was also noted.  Photic driving was not seen during photic stimulation.  Hyperventilation was not not performed. ABNORMALITY -Continuous slow, right posterior quadrant -Intermittent slow, generalized IMPRESSION: This study is suggestive of cortical dysfunction in right posterior quadrant likely secondary to underlying stroke.  Additionally there is mild diffuse encephalopathy.  No seizures or epileptiform discharges were seen throughout the recording. Arlin KIDD Shelton   MR Brain W and Wo Contrast Result Date: 07/30/2023 CLINICAL DATA:  New onset headache EXAM: MRI HEAD WITHOUT AND WITH CONTRAST TECHNIQUE: Multiplanar, multiecho pulse sequences of the brain and surrounding structures were obtained without and with intravenous contrast. CONTRAST:  8mL GADAVIST  GADOBUTROL  1 MMOL/ML IV SOLN COMPARISON:  No prior MRI available, correlation is made with CT head 07/30/2023 FINDINGS: Brain: Restricted diffusion with ADC correlate in the right posterior temporal and lateral occipital and parietal cortex (series 3, images 22-33), which is associated with increased T2 hyperintense signal, gyral swelling, and hemosiderin deposition, likely petechial hemorrhage. Additional punctate foci of restricted diffusion with ADC correlate in the right cerebellum (series 3, image 8), left temporal lobe (series 3, image 19), left occipital lobe (series 3, image 22), right occipital lobe (series 3, images 23), right parietal lobe (series 3, images 29-41), right frontal lobe (series 3, image 28 and 30), right thalamus (series 3, image 29), and left frontal lobe (series 3, images 25 and 33-34); these foci are both in the white matter and cortex. No abnormal parenchymal or meningeal enhancement. No mass, mass effect, or midline shift. No hydrocephalus or extra-axial collection. Pituitary and craniocervical junction  within normal limits. Vascular: Normal arterial flow voids. Normal arterial and venous  enhancement. Skull and upper cervical spine: Normal marrow signal. Sinuses/Orbits: Clear paranasal sinuses. No acute finding in the orbits. Other: The mastoid air cells are well aerated. IMPRESSION: 1. Acute to early subacute infarct in the right posterior temporal and lateral occipital and parietal cortex, with associated edema and petechial hemorrhage. 2. Additional punctate acute infarcts in the right cerebellum, left temporal lobe, left occipital lobe, right occipital lobe, right parietal lobe, right frontal lobe, right thalamus, and left frontal lobe. These foci are both in the white matter and cortex. The distribution is suggestive of a central embolic phenomenon. These results will be called to the ordering clinician or representative by the Radiologist Assistant, and communication documented in the PACS or [employer]. Electronically Signed   By: Donald Campion M.D.   On: 07/30/2023 22:55   CT CHEST ABDOMEN PELVIS W CONTRAST Result Date: 07/30/2023 CLINICAL DATA:  Trauma EXAM: CT CHEST, ABDOMEN, AND PELVIS WITH CONTRAST TECHNIQUE: Multidetector CT imaging of the chest, abdomen and pelvis was performed following the standard protocol during bolus administration of intravenous contrast. RADIATION DOSE REDUCTION: This exam was performed according to the departmental dose-optimization program which includes automated exposure control, adjustment of the mA and/or kV according to patient size and/or use of iterative reconstruction technique. CONTRAST:  75mL OMNIPAQUE  IOHEXOL  350 MG/ML SOLN COMPARISON:  None Available. FINDINGS: CT CHEST FINDINGS Cardiovascular: Severe aortic atherosclerosis. Normal heart size. No pericardial effusion. Mediastinum/Nodes: No enlarged mediastinal, hilar, or axillary lymph nodes. Thyroid gland, trachea, and esophagus demonstrate no significant findings. Lungs/Pleura: Mild centrilobular emphysema. Mild pulmonary fibrosis in a pattern with apical to basal gradient featuring  irregular peripheral interstitial opacity and septal thickening with minimal traction bronchiectasis and minimal areas of subpleural bronchiolectasis of the lung bases. Small irregular nodule of the posterior left apex measuring 0.6 cm (series 6, image 69). No pleural effusion or pneumothorax. Musculoskeletal: No chest wall abnormality. No acute osseous findings. CT ABDOMEN PELVIS FINDINGS Hepatobiliary: No solid liver abnormality is seen. Coarse, nodular cirrhotic morphology of the liver. No gallstones, gallbladder wall thickening, or biliary dilatation. Pancreas: Unremarkable. No pancreatic ductal dilatation or surrounding inflammatory changes. Spleen: Normal in size without significant abnormality. Adrenals/Urinary Tract: Adrenal glands are unremarkable. Kidneys are normal, without renal calculi, solid lesion, or hydronephrosis. Bladder is unremarkable. Stomach/Bowel: Stomach is within normal limits. Appendix appears normal. No evidence of bowel wall thickening, distention, or inflammatory changes. Sigmoid diverticulosis. Vascular/Lymphatic: Severe aortic atherosclerosis. 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 No enlarged abdominal or pelvic lymph nodes. Reproductive: No mass or other abnormality. Other: No abdominal wall hernia or abnormality. No ascites. Musculoskeletal: No acute osseous findings. IMPRESSION: 1. No CT evidence of acute traumatic injury to the chest, abdomen, or pelvis. 2. Mild pulmonary fibrosis in a pattern with apical to basal gradient featuring irregular peripheral interstitial opacity and septal thickening with minimal traction bronchiectasis and minimal areas of subpleural bronchiolectasis of the lung bases. 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 3. Small irregular nodule of the posterior left apex measuring 0.6 cm. Non-contrast chest CT at 6-12 months is recommended. If the nodule is stable at time of repeat CT, then future  CT at 18-24 months (from today's scan) is considered optional for low-risk patients, but is recommended for high-risk patients. This recommendation follows the consensus statement: Guidelines for Management of Incidental Pulmonary Nodules Detected on CT Images: From the Fleischner Society  2017; Radiology 2017; 284:228-243. 4. Cirrhosis. 5. 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 6. Sigmoid diverticulosis without evidence of acute diverticulitis. Aortic Atherosclerosis (ICD10-I70.0) and Emphysema (ICD10-J43.9). Electronically Signed   By: Marolyn JONETTA Jaksch M.D.   On: 07/30/2023 14:18   CT HEAD WO CONTRAST Result Date: 07/30/2023 CLINICAL DATA:  Trauma EXAM: CT HEAD WITHOUT CONTRAST CT CERVICAL SPINE WITHOUT CONTRAST TECHNIQUE: Multidetector CT imaging of the head and cervical spine was performed following the standard protocol without intravenous contrast. Multiplanar CT image reconstructions of the cervical spine were also generated. RADIATION DOSE REDUCTION: This exam was performed according to the departmental dose-optimization program which includes automated exposure control, adjustment of the mA and/or kV according to patient size and/or use of iterative reconstruction technique. COMPARISON:  None Available. FINDINGS: CT HEAD FINDINGS Brain: Cortical based hypodensity of the right parieto-occipital junction measuring 2.4 x 2.2 cm (series 11, image 25, series 9, image 24). No evidence of hemorrhage, hydrocephalus, extra-axial collection or mass effect. Vascular: No hyperdense vessel or unexpected calcification. Skull: Normal. Negative for fracture or focal lesion. Sinuses/Orbits: No acute finding. Other: Soft tissue contusion of the left scalp vertex (series 11, image 7) CT CERVICAL SPINE FINDINGS Alignment: Degenerative straightening of the normal cervical lordosis. Skull base and vertebrae: No acute fracture. No primary bone lesion or focal pathologic process. Soft tissues and spinal canal: No  prevertebral fluid or swelling. No visible canal hematoma. Disc levels: Moderate disc space height loss and osteophytosis from C5-C7 Upper chest: Negative. Other: None. IMPRESSION: 1. Cortical based hypodensity of the right parieto-occipital junction measuring 2.4 x 2.2 cm. This is concerning for acute or subacute infarct or alternately underlying mass. Recommend MRI for further evaluation. 2. No evidence of hemorrhage or mass effect. 3. Soft tissue contusion of the left scalp vertex. 4. No fracture or static subluxation of the cervical spine. 5. Moderate disc degenerative disease from C5-C7. Electronically Signed   By: Marolyn JONETTA Jaksch M.D.   On: 07/30/2023 14:13   CT CERVICAL SPINE WO CONTRAST Result Date: 07/30/2023 CLINICAL DATA:  Trauma EXAM: CT HEAD WITHOUT CONTRAST CT CERVICAL SPINE WITHOUT CONTRAST TECHNIQUE: Multidetector CT imaging of the head and cervical spine was performed following the standard protocol without intravenous contrast. Multiplanar CT image reconstructions of the cervical spine were also generated. RADIATION DOSE REDUCTION: This exam was performed according to the departmental dose-optimization program which includes automated exposure control, adjustment of the mA and/or kV according to patient size and/or use of iterative reconstruction technique. COMPARISON:  None Available. FINDINGS: CT HEAD FINDINGS Brain: Cortical based hypodensity of the right parieto-occipital junction measuring 2.4 x 2.2 cm (series 11, image 25, series 9, image 24). No evidence of hemorrhage, hydrocephalus, extra-axial collection or mass effect. Vascular: No hyperdense vessel or unexpected calcification. Skull: Normal. Negative for fracture or focal lesion. Sinuses/Orbits: No acute finding. Other: Soft tissue contusion of the left scalp vertex (series 11, image 7) CT CERVICAL SPINE FINDINGS Alignment: Degenerative straightening of the normal cervical lordosis. Skull base and vertebrae: No acute fracture. No  primary bone lesion or focal pathologic process. Soft tissues and spinal canal: No prevertebral fluid or swelling. No visible canal hematoma. Disc levels: Moderate disc space height loss and osteophytosis from C5-C7 Upper chest: Negative. Other: None. IMPRESSION: 1. Cortical based hypodensity of the right parieto-occipital junction measuring 2.4 x 2.2 cm. This is concerning for acute or subacute infarct or alternately underlying mass. Recommend MRI for further evaluation. 2. No evidence of hemorrhage or mass effect. 3.  Soft tissue contusion of the left scalp vertex. 4. No fracture or static subluxation of the cervical spine. 5. Moderate disc degenerative disease from C5-C7. Electronically Signed   By: Marolyn JONETTA Jaksch M.D.   On: 07/30/2023 14:13    Physical Exam: BP 132/76   Pulse 80   Ht 5' 5 (1.651 m)   Wt 190 lb 4 oz (86.3 kg)   BMI 31.66 kg/m  Constitutional: Pleasant,well-developed, Caucasian female in no acute distress. HEENT: Normocephalic and atraumatic. Conjunctivae are normal. No scleral icterus. Neck supple.  Cardiovascular: Normal rate, regular rhythm.  Pulmonary/chest: Effort normal and breath sounds normal. No wheezing, rales or rhonchi. Abdominal: Soft, nondistended, nontender. Bowel sounds active throughout. There are no masses palpable. No hepatomegaly. Extremities: no edema Neurological: Alert and oriented to person place and time.  No asterixis Skin: Skin is warm and dry. No rashes noted. Psychiatric: Normal mood and affect. Behavior is normal.  CBC    Component Value Date/Time   WBC 8.1 08/12/2023 0802   RBC 3.92 08/12/2023 0802   HGB 9.6 (L) 08/12/2023 0802   HCT 29.8 (L) 08/12/2023 0802   PLT 139 (L) 08/12/2023 0802   MCV 76.0 (L) 08/12/2023 0802   MCH 24.5 (L) 08/12/2023 0802   MCHC 32.2 08/12/2023 0802   RDW 25.3 (H) 08/12/2023 0802   LYMPHSABS 3.9 07/31/2023 0222   MONOABS 0.9 07/31/2023 0222   EOSABS 0.0 07/31/2023 0222   BASOSABS 0.0 07/31/2023 0222     CMP     Component Value Date/Time   NA 136 08/09/2023 1031   K 3.6 08/09/2023 1031   CL 102 08/09/2023 1031   CO2 23 08/09/2023 1031   GLUCOSE 223 (H) 08/09/2023 1031   BUN 10 08/09/2023 1031   CREATININE 0.61 08/09/2023 1031   CALCIUM  9.1 08/09/2023 1031   PROT 6.2 (L) 08/06/2023 0450   ALBUMIN  3.4 (L) 08/06/2023 0450   AST 30 08/06/2023 0450   ALT 33 08/06/2023 0450   ALKPHOS 52 08/06/2023 0450   BILITOT 0.8 08/06/2023 0450   GFRNONAA >60 08/09/2023 1031       Latest Ref Rng & Units 08/12/2023    8:02 AM 08/11/2023    3:06 AM 08/10/2023    4:23 AM  CBC EXTENDED  WBC 4.0 - 10.5 K/uL 8.1  7.3  8.5   RBC 3.87 - 5.11 MIL/uL 3.92  3.74  3.93   Hemoglobin 12.0 - 15.0 g/dL 9.6  9.1  9.7   HCT 63.9 - 46.0 % 29.8  28.7  29.9   Platelets 150 - 400 K/uL 139  124  133    MELD 3.0: 8 at 08/06/2023  4:50 AM MELD-Na: 6 at 08/06/2023  4:50 AM Calculated from: Serum Creatinine: 0.69 mg/dL (Using min of 1 mg/dL) at 87/69/7975  5:49 AM Serum Sodium: 136 mmol/L at 08/06/2023  4:50 AM Total Bilirubin: 0.8 mg/dL (Using min of 1 mg/dL) at 87/69/7975  5:49 AM Serum Albumin : 3.4 g/dL at 87/69/7975  5:49 AM INR(ratio): 1 at 08/05/2023  4:20 AM Age at listing (hypothetical): 79 years Sex: Female at 08/06/2023  4:50 AM     ASSESSMENT AND PLAN:  68 year old female with no recent medical encounters, who was admitted from Dec 23-Jan 5th with 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   Gastric Varices with Recent Bleed Recent GI bleed from gastric varices,  treated with BRTO. No current symptoms of bleeding. Normal stool color and regular bowel movements.  Interventional radiology had recommended repeat upper endoscopy in 6 weeks following BRTO.  Given patient's recent cryptogenic stroke and possible underlying dysrhythmia,  would prefer to defer any elective sedated procedures for now.  Will discuss with interventional radiology about the necessity of repeating an upper endoscopy this soon after her stroke. 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  Negative work up for viral and autoimmune disease. No signs of ascites or hepatic encephalopathy. Educated on cirrhosis and importance of diabetes management to prevent further liver damage. Discussed increased liver cancer risk and need for regular monitoring. 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 Discussed careful blood thinner management if upper endoscopy is required. - Continue Eliquis  as prescribed; would tentatively recommend 3-6 months of anticoagulation - Monitor for signs of bleeding or clotting complications  Cerebrovascular Accident (Stroke) Recent multiple strokes with initial presentation of severe headaches and loss of consciousness. Currently no lingering neurological symptoms. General weakness and fatigue likely due to recent hospitalization. Discussed sedation risks for upper endoscopy given recent cyrptogenic stroke with possible underlying cardiac etiology. - Monitor for new neurological symptoms - Follow up with  neurology and cardiology as scheduled  Diabetes Mellitus Newly diagnosed diabetes, managed with long-acting insulin  and oral hypoglycemic agents. Receiving assistance with insulin  administration. Emphasized importance of diet and exercise. - Continue current insulin  regimen - Monitor blood glucose levels regularly - Follow up with PCP    No ref. provider found

## 2023-08-14 NOTE — Therapy (Signed)
 OUTPATIENT OCCUPATIONAL THERAPY NEURO EVALUATION  Patient Name: Maria Rose MRN: [id number] DOB:Oct 25, 1955, 68 y.o., female Today's Date: 08/15/2023  PCP: none REFERRING PROVIDER: Lovie Clarity, MD  END OF SESSION:  OT End of Session - 08/15/23 1339     Visit Number 1    Number of Visits 25    Date for OT Re-Evaluation 11/07/23    Authorization Type [employer]    OT Start Time 0848    OT Stop Time 0928    OT Time Calculation (min) 40 min    Behavior During Therapy [facility] for tasks assessed/performed             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   Past Surgical History:  Procedure Laterality Date   ESOPHAGOGASTRODUODENOSCOPY (EGD) WITH PROPOFOL  N/A 07/31/2023   Procedure: ESOPHAGOGASTRODUODENOSCOPY (EGD) WITH PROPOFOL ;  Surgeon: Stacia Glendia BRAVO, MD;  Location: [facility] ENDOSCOPY;  Service: Gastroenterology;  Laterality: N/A;   HEMOSTASIS CONTROL  07/31/2023   Procedure: HEMOSTASIS CONTROL;  Surgeon: Stacia Glendia BRAVO, MD;  Location: [facility] ENDOSCOPY;  Service: Gastroenterology;;   IR ANGIOGRAM VISCERAL SELECTIVE  08/07/2023   IR ANGIOGRAM VISCERAL SELECTIVE  08/07/2023   IR EMBO VENOUS NOT HEMORR HEMANG  INC GUIDE ROADMAPPING  08/05/2023   IR RENAL SELECTIVE  UNI INC S&I MOD SED  08/07/2023   IR US  GUIDE VASC ACCESS RIGHT  08/05/2023   RADIOLOGY WITH ANESTHESIA N/A 08/05/2023   Procedure: IR WITH ANESTHESIA -TIPS;  Surgeon: Radiologist, Medication, MD;  Location: [facility] OR;  Service: Radiology;  Laterality: N/A;   Patient Active Problem List   Diagnosis Date Noted   Hepatic cirrhosis (HCC) 08/14/2023   Other cirrhosis of liver (HCC) 08/11/2023   Iron  deficiency anemia 08/11/2023   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   Decompensated cirrhosis (HCC) 07/31/2023   Bleeding gastric varices 07/31/2023   Cerebrovascular accident (CVA) (HCC)  07/31/2023   Type 2 diabetes mellitus (HCC) 07/30/2023    ONSET DATE: 07/30/23  REFERRING DIAG: I63.9 (ICD-10-CM) - Stroke (HCC)   THERAPY DIAG:  Frontal lobe and executive function deficit - Plan: Ot plan of care cert/re-cert  Visuospatial deficit - Plan: Ot plan of care cert/re-cert  Attention and concentration deficit - Plan: Ot plan of care cert/re-cert  Muscle weakness (generalized) - Plan: Ot plan of care cert/re-cert  Other lack of coordination - Plan: Ot plan of care cert/re-cert  Rationale for Evaluation and Treatment: Rehabilitation  SUBJECTIVE:   SUBJECTIVE STATEMENT: Pt rpeortsIt's been alot Pt accompanied by: self  PERTINENT HISTORY: Per chart, 68 y.o. female presented to [facility] on 07/30/2023 after a fall and shaking episode. 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 Additional punctate acute infarcts in right cerebellum, left temporal lobe, left occipital lobe, right occipital lobe, right parietal lobe, right frontal lobe, right thalamus, left frontal lobe. Pt with bloody bowel movement and hematemesis on 12/24.  While hospitalized  she suffered a brisk upper GI bleed associated with profound hypotension and hemoglobin drop from 10 to 5.  She underwent an upper endoscopy which showed grade 1 varices and gastric varices with bleeding stigmata, attributed as the bleeding source.  She underwent BRTO on December 29 and a subsequent CT angiogram showed successful occlusion of the gastric varices with splenorenal shunt, but did  show an acute nonocclusive SMV thrombus.  She was started on IV heparin  and transitioned to oral anticoagulation. Prior to this admission, the patient had no idea she had liver disease or cirrhosis.  PMH includes hepatic cirrhosis, gastric and splenic varices, sepsis. Pt d/c home 08/12/23   PRECAUTIONS: Fall  WEIGHT BEARING RESTRICTIONS:  No  PAIN:  Are you having pain? No  FALLS: Has patient fallen in last 6 months? No  LIVING ENVIRONMENT: Lives with: lives with their family Lives in: House/apartment Stairs: Yes: Internal: 15 steps; on right going up and on left going up Has following equipment at home: Vannie - 2 wheeled  PLOF: Independent  PATIENT GOALS: maintian I   OBJECTIVE:  Note: Objective measures were completed at Evaluation unless otherwise noted.  HAND DOMINANCE: Left  ADLs: Overall ADLs: supervision-mod I with all basic ADLs. Transfers/ambulation related to ADLs: Eating: mod I Grooming: mod I UB Dressing: mod I LB Dressing: mod I Toileting: supervision Bathing: sponge bath Tub Shower transfers: not performing   IADLs:  Meal Prep: supervision  for simple breakfast prep, has not attempted previosly level of cooking Community mobility: supervision Medication management: keeps up with meds , has pillbox but has not set up Financial management: has some auto bill pay set up Handwriting: 100% legible  MOBILITY STATUS: supervision  POSTURE COMMENTS:    ACTIVITY TOLERANCE: Activity tolerance: impaired per pt report   UPPER EXTREMITY ROM:  WFLS    UPPER EXTREMITY MMT:     MMT Right eval Left eval  Shoulder flexion 4+/5 4-/5  Shoulder abduction    Shoulder adduction    Shoulder extension    Shoulder internal rotation    Shoulder external rotation    Middle trapezius    Lower trapezius    Elbow flexion 4+/5 4+/5  Elbow extension 4+/5 4/5  Wrist flexion    Wrist extension    Wrist ulnar deviation    Wrist radial deviation    Wrist pronation    Wrist supination    (Blank rows = not tested)  HAND FUNCTION: Grip strength: Right: 30 lbs; Left: 45 lbs  COORDINATION: 9 Hole Peg test: Right: 31.39 sec; Left: 34.09 sec  SENSATION: WFL   COGNITION: Overall cognitive status: Impaired-  impaired short term meory, 2/3 words recalled after short delay, spells WORLD backwards  without difficulty. Clock drawing task: pt wrote numbers backwards on clock face with 11, 10 , 9 on right side of clock.  Per speech therapy pt completed medication management task correctly.   VISION: Subjective report: Pt reports something is off Baseline vision:  usually wears contacts   VISION ASSESSMENT: Gaze preference/alignment: gaze right Tracking/Visual pursuits: Decreased smoothness of eye movement to Left  visual field Saccades: impaired Visual Fields: Left visual field deficits   PERCEPTION: Impaired: Inattention/neglect: does not attend to left visual field, number cancellation- 2/80 missed on left margin- discorganized scan pattern    OBSERVATIONS: Pleasant female who appears overwhelmed by current situation. She had not seen MD in many years prior to CVA  TREATMENT DATE: 08/15/23 eval        PATIENT EDUCATION: Education details: role of OT, potential goals, recommendation for tub transfer bench- information provided. Therapist recommends no driving due to visual deficits and supervision with cooking for safety. Therapist initated basic scanning strategy and encouraged head turns to avoid bumping into things on left. Person educated: Patient Education method: Chief Technology Officer Education comprehension: verbalized understanding  HOME EXERCISE PROGRAM: not issued yet   GOALS: Goals reviewed with patient? Yes  SHORT TERM GOALS: Target date: 09/14/23  I with inital HEP including LUE coordination  Goal status: INITIAL  2.  Pt will verbalize understanding of compensatory strategies for visual deficits.  Goal status: INITIAL  3.  Pt will perform tabletop scanning activities with a cognitve component with  95% or better accuracy.  Goal status: INITIAL  4.  Pt will perform basic environmental scanning with 90% or better  accuracy.  Goal status: INITIAL  5.  Pt will perfrom all basic ADLs mod I  Goal status: INITIAL 6. Pt will verbalize understanding of compensatory strategies for cognitve deficits. Goal status: new    LONG TERM GOALS: Target date: 11/07/23  I with updated HEP proximal strength  Goal status: INITIAL  2.  Pt will perform a physical and cogntive task simultaneously with 90% or better accuracy.  Goal status: INITIAL  3.  Pt will perform mod complex environmental scanning( with a cognitve component) with 90% or better accuracy.  Goal status: INITIAL  4.  Pt will perform a functional organization/ planning  task with 90% or better accuracy.( generating a grocery list/ meal planning etc)  Goal status: INITIAL  5.  Pt will resume cooking and performing home management activities mod I   Goal status: INITIAL   ASSESSMENT:  CLINICAL IMPRESSION: Patient is a 68 y.o. female who was seen today for occupational therapy evaluation for CVA. Pt presents with the following deficits: L visual field deficit/ inattention, cognitve/ executive function deficits, decreased strength, decreased coordination, decreased balance which impedes perfromance of ADLs/IADLs. Pt can benefit from skilled occupational therapy to address these deficits in order tomaximize pt's safety and I with daily activities.  PERFORMANCE DEFICITS: in functional skills including ADLs, IADLs, coordination, dexterity, strength, Fine motor control, mobility, balance, decreased knowledge of precautions, decreased knowledge of use of DME, vision, and UE functional use, cognitive skills including attention, memory, perception, problem solving, and safety awareness, and psychosocial skills including coping strategies, environmental adaptation, habits, interpersonal interactions, and routines and behaviors.   IMPAIRMENTS: are limiting patient from ADLs, IADLs, play, leisure, and social participation.   CO-MORBIDITIES: may have  co-morbidities  that affects occupational performance. Patient will benefit from skilled OT to address above impairments and improve overall function.  MODIFICATION OR ASSISTANCE TO COMPLETE EVALUATION: No modification of tasks or assist necessary to complete an evaluation.  OT OCCUPATIONAL PROFILE AND HISTORY: Detailed assessment: Review of records and additional review of physical, cognitive, psychosocial history related to current functional performance.  CLINICAL DECISION MAKING: LOW - limited treatment options, no task modification necessary  REHAB POTENTIAL: Good  EVALUATION COMPLEXITY: Low    PLAN:  OT FREQUENCY: 2x/week  OT DURATION: 12 weeks- may d/c sooner based upon pt progress.  PLANNED INTERVENTIONS: 97168 OT Re-evaluation, 97535 self care/ADL training, 02889 therapeutic exercise, 97530 therapeutic activity, 97112 neuromuscular re-education, 97140 manual therapy, 97035 ultrasound, 97018 paraffin, 02989 moist heat, 97010 cryotherapy, 97034 contrast bath, 97129 Cognitive training (first 15 min), 02869 Cognitive training(each additional 15 min), passive range  of motion, balance training, functional mobility training, visual/perceptual remediation/compensation, energy conservation, coping strategies training, patient/family education, and DME and/or AE instructions  RECOMMENDED OTHER SERVICES: PT  CONSULTED AND AGREED WITH PLAN OF CARE: Patient  PLAN FOR NEXT SESSION: check environmental scanning, Visual compensation strategies for L visual field deficit,     Ourania Hamler, OT 08/15/2023, 2:16 PM

## 2023-08-14 NOTE — Patient Instructions (Signed)
 _______________________________________________________  If your blood pressure at your visit was 140/90 or greater, please contact your primary care physician to follow up on this.  _______________________________________________________  If you are age 69 or older, your body mass index should be between 23-30. Your Body mass index is 31.66 kg/m. If this is out of the aforementioned range listed, please consider follow up with your Primary Care Provider.  If you are age 21 or younger, your body mass index should be between 19-25. Your Body mass index is 31.66 kg/m. If this is out of the aformentioned range listed, please consider follow up with your Primary Care Provider.   ________________________________________________________  The [facility] providers would like to encourage you to use MYCHART to communicate with providers for non-urgent requests or questions.  Due to long hold times on the telephone, sending your provider a message by [facility] may be a faster and more efficient way to get a response.  Please allow 48 business hours for a response.  Please remember that this is for non-urgent requests.  _______________________________________________________  Please follow up in 2-3 months. Give us  a call at [phone] to schedule an appointment at the end of January for a march/April appointment  Continue current medications. Avoid NSAID's  You have been scheduled for an abdominal ultrasound at [facility] (1st floor of hospital) on 02-11-24 at 8am. Please arrive 30 minutes prior to your appointment for registration. Make certain not to have anything to eat or drink midnight prior to your appointment. Should you need to reschedule your appointment, please contact radiology at [phone]. This test typically takes about 30 minutes to perform.  Low-Sodium Eating Plan Salt (sodium) helps you keep a healthy balance of fluids in your body. Too much sodium can raise your blood  pressure. It can also cause fluid and waste to be held in your body. Your health care provider or dietitian may recommend a low-sodium eating plan if you have high blood pressure (hypertension), kidney disease, liver disease, or heart failure. Eating less sodium can help lower your blood pressure and reduce swelling. It can also protect your heart, liver, and kidneys. What are tips for following this plan? Reading food labels  Check food labels for the amount of sodium per serving. If you eat more than one serving, you must multiply the listed amount by the number of servings. Choose foods with less than 140 milligrams (mg) of sodium per serving. Avoid foods with 300 mg of sodium or more per serving. Always check how much sodium is in a product, even if the label says unsalted or no salt added. Shopping  Buy products labeled as low-sodium or no salt added. Buy fresh foods. Avoid canned foods and pre-made or frozen meals. Avoid canned, cured, or processed meats. Buy breads that have less than 80 mg of sodium per slice. Cooking  Eat more home-cooked food. Try to eat less restaurant, buffet, and fast food. Try not to add salt when you cook. Use salt-free seasonings or herbs instead of table salt or sea salt. Check with your provider or pharmacist before using salt substitutes. Cook with plant-based oils, such as canola, sunflower, or olive oil. Meal planning When eating at a restaurant, ask if your food can be made with less salt or no salt. Avoid dishes labeled as brined, pickled, cured, or smoked. Avoid dishes made with soy sauce, miso, or teriyaki sauce. Avoid foods that have monosodium glutamate (MSG) in them. MSG may be added to some restaurant food, sauces,  soups, bouillon, and canned foods. Make meals that can be grilled, baked, poached, roasted, or steamed. These are often made with less sodium. General information Try to limit your sodium intake to 1,500-2,300 mg each day, or  the amount told by your provider. What foods should I eat? Fruits Fresh, frozen, or canned fruit. Fruit juice. Vegetables Fresh or frozen vegetables. No salt added canned vegetables. No salt added tomato sauce and paste. Low-sodium or reduced-sodium tomato and vegetable juice. Grains Low-sodium cereals, such as oats, puffed wheat and rice, and shredded wheat. Low-sodium crackers. Unsalted rice. Unsalted pasta. Low-sodium bread. Whole grain breads and whole grain pasta. Meats and other proteins Fresh or frozen meat, poultry, seafood, and fish. These should have no added salt. Low-sodium canned tuna and salmon. Unsalted nuts. Dried peas, beans, and lentils without added salt. Unsalted canned beans. Eggs. Unsalted nut butters. Dairy Milk. Soy milk. Cheese that is naturally low in sodium, such as ricotta cheese, fresh mozzarella, or Swiss cheese. Low-sodium or reduced-sodium cheese. Cream cheese. Yogurt. Seasonings and condiments Fresh and dried herbs and spices. Salt-free seasonings. Low-sodium mustard and ketchup. Sodium-free salad dressing. Sodium-free light mayonnaise. Fresh or refrigerated horseradish. Lemon juice. Vinegar. Other foods Homemade, reduced-sodium, or low-sodium soups. Unsalted popcorn and pretzels. Low-salt or salt-free chips. The items listed above may not be all the foods and drinks you can have. Talk to a dietitian to learn more. What foods should I avoid? Vegetables Sauerkraut, pickled vegetables, and relishes. Olives. French fries. Onion rings. Regular canned vegetables, except low-sodium or reduced-sodium items. Regular canned tomato sauce and paste. Regular tomato and vegetable juice. Frozen vegetables in sauces. Grains Instant hot cereals. Bread stuffing, pancake, and biscuit mixes. Croutons. Seasoned rice or pasta mixes. Noodle soup cups. Boxed or frozen macaroni and cheese. Regular salted crackers. Self-rising flour. Meats and other proteins Meat or fish that is  salted, canned, smoked, spiced, or pickled. Precooked or cured meat, such as sausages or meat loaves. [place]. Ham. Pepperoni. Hot dogs. Corned beef. Chipped beef. Salt pork. Jerky. Pickled herring, anchovies, and sardines. Regular canned tuna. Salted nuts. Dairy Processed cheese and cheese spreads. Hard cheeses. Cheese curds. Blue cheese. Feta cheese. String cheese. Regular cottage cheese. Buttermilk. Canned milk. Fats and oils Salted butter. Regular margarine. Ghee. Bacon fat. Seasonings and condiments Onion salt, garlic salt, seasoned salt, table salt, and sea salt. Canned and packaged gravies. Worcestershire sauce. Tartar sauce. Barbecue sauce. Teriyaki sauce. Soy sauce, including reduced-sodium soy sauce. Steak sauce. Fish sauce. Oyster sauce. Cocktail sauce. Horseradish that you find on the shelf. Regular ketchup and mustard. Meat flavorings and tenderizers. Bouillon cubes. Hot sauce. Pre-made or packaged marinades. Pre-made or packaged taco seasonings. Relishes. Regular salad dressings. Salsa. Other foods Salted popcorn and pretzels. Corn chips and puffs. Potato and tortilla chips. Canned or dried soups. Pizza. Frozen entrees and pot pies. The items listed above may not be all the foods and drinks you should avoid. Talk to a dietitian to learn more. This information is not intended to replace advice given to you by your health care provider. Make sure you discuss any questions you have with your health care provider. Document Revised: 08/10/2022 Document Reviewed: 08/10/2022 Elsevier Patient Education  2024 [employer].

## 2023-08-15 ENCOUNTER — Ambulatory Visit: Payer: Commercial Managed Care - PPO | Admitting: Speech Pathology

## 2023-08-15 ENCOUNTER — Encounter: Payer: Self-pay | Admitting: Speech Pathology

## 2023-08-15 ENCOUNTER — Encounter: Payer: Self-pay | Admitting: Occupational Therapy

## 2023-08-15 ENCOUNTER — Ambulatory Visit: Payer: Commercial Managed Care - PPO | Attending: Internal Medicine | Admitting: Physical Therapy

## 2023-08-15 ENCOUNTER — Ambulatory Visit: Payer: Commercial Managed Care - PPO | Admitting: Occupational Therapy

## 2023-08-15 ENCOUNTER — Encounter: Payer: Self-pay | Admitting: Physical Therapy

## 2023-08-15 DIAGNOSIS — M6281 Muscle weakness (generalized): Secondary | ICD-10-CM

## 2023-08-15 DIAGNOSIS — I639 Cerebral infarction, unspecified: Secondary | ICD-10-CM | POA: Insufficient documentation

## 2023-08-15 DIAGNOSIS — R41844 Frontal lobe and executive function deficit: Secondary | ICD-10-CM

## 2023-08-15 DIAGNOSIS — R2681 Unsteadiness on feet: Secondary | ICD-10-CM | POA: Diagnosis present

## 2023-08-15 DIAGNOSIS — R2689 Other abnormalities of gait and mobility: Secondary | ICD-10-CM | POA: Diagnosis present

## 2023-08-15 DIAGNOSIS — R262 Difficulty in walking, not elsewhere classified: Secondary | ICD-10-CM | POA: Insufficient documentation

## 2023-08-15 DIAGNOSIS — R278 Other lack of coordination: Secondary | ICD-10-CM

## 2023-08-15 DIAGNOSIS — R41842 Visuospatial deficit: Secondary | ICD-10-CM | POA: Diagnosis present

## 2023-08-15 DIAGNOSIS — R41840 Attention and concentration deficit: Secondary | ICD-10-CM | POA: Insufficient documentation

## 2023-08-15 DIAGNOSIS — R41841 Cognitive communication deficit: Secondary | ICD-10-CM | POA: Diagnosis present

## 2023-08-15 NOTE — Therapy (Signed)
 OUTPATIENT PHYSICAL THERAPY NEURO EVALUATION   Patient Name: Maria Rose MRN: [id number] DOB:February 04, 1956, 68 y.o., female Today's Date: 08/15/2023  PCP: N/A REFERRING PROVIDER: Lovie Clarity, MD  END OF SESSION:  PT End of Session - 08/15/23 1503     Visit Number 1    Date for PT Re-Evaluation 10/10/23    PT Start Time 0930    PT Stop Time 1010    PT Time Calculation (min) 40 min    Activity Tolerance Patient tolerated treatment well    Behavior During Therapy [facility] for tasks assessed/performed            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   Past Surgical History:  Procedure Laterality Date   ESOPHAGOGASTRODUODENOSCOPY (EGD) WITH PROPOFOL  N/A 07/31/2023   Procedure: ESOPHAGOGASTRODUODENOSCOPY (EGD) WITH PROPOFOL ;  Surgeon: Stacia Glendia BRAVO, MD;  Location: [facility] ENDOSCOPY;  Service: Gastroenterology;  Laterality: N/A;   HEMOSTASIS CONTROL  07/31/2023   Procedure: HEMOSTASIS CONTROL;  Surgeon: Stacia Glendia BRAVO, MD;  Location: [facility] ENDOSCOPY;  Service: Gastroenterology;;   IR ANGIOGRAM VISCERAL SELECTIVE  08/07/2023   IR ANGIOGRAM VISCERAL SELECTIVE  08/07/2023   IR EMBO VENOUS NOT HEMORR HEMANG  INC GUIDE ROADMAPPING  08/05/2023   IR RENAL SELECTIVE  UNI INC S&I MOD SED  08/07/2023   IR US  GUIDE VASC ACCESS RIGHT  08/05/2023   RADIOLOGY WITH ANESTHESIA N/A 08/05/2023   Procedure: IR WITH ANESTHESIA -TIPS;  Surgeon: Radiologist, Medication, MD;  Location: [facility] OR;  Service: Radiology;  Laterality: N/A;   Patient Active Problem List   Diagnosis Date Noted   Hepatic cirrhosis (HCC) 08/14/2023   Other cirrhosis of liver (HCC) 08/11/2023   Iron  deficiency anemia 08/11/2023   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   Decompensated cirrhosis (HCC) 07/31/2023   Bleeding gastric varices 07/31/2023   Cerebrovascular accident (CVA) (HCC)  07/31/2023   Type 2 diabetes mellitus (HCC) 07/30/2023    ONSET DATE: 08/12/23  REFERRING DIAG: I63.9 (ICD-10-CM) - Stroke (HCC)   THERAPY DIAG:  Difficulty in walking, not elsewhere classified - Plan: PT plan of care cert/re-cert  Muscle weakness (generalized) - Plan: PT plan of care cert/re-cert  Other abnormalities of gait and mobility - Plan: PT plan of care cert/re-cert  Other lack of coordination - Plan: PT plan of care cert/re-cert  Unsteadiness on feet - Plan: PT plan of care cert/re-cert  Cerebrovascular accident (CVA), unspecified mechanism (HCC) - Plan: PT plan of care cert/re-cert  Rationale for Evaluation and Treatment: Rehabilitation  SUBJECTIVE:  SUBJECTIVE STATEMENT: Patient reports recent stroke. She has vision issues and some weakness on her L side. Her knees ache and she has H/O B ankle/foot injuries. Pt accompanied by: self  PERTINENT HISTORY:  Per hospital PT evaluation note: 68 y.o. female presents to [facility] hospital on 07/30/2023 after a fall and shaking episode. 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 Additional punctate acute infarcts in right cerebellum, left temporal lobe, left occipital lobe, right occipital lobe, right parietal lobe, right frontal lobe, right thalamus, left frontal lobe. Pt with bloody bowel movement and hematemesis on 12/24. PMH includes hepatic cirrhosis, gastric and splenic varices, sepsis.   PAIN:  Are you having pain? Yes: NPRS scale: 8/10 Pain location: B knees and bottoms of her feet. Pain description: Chronic Aggravating factors: Cold weather, too much walking. Relieving factors: Has knee brace which helps, was taking Allieve, but had to stop  since stroke.  PRECAUTIONS: Fall and Other: L visual field cut  RED FLAGS: None   WEIGHT BEARING RESTRICTIONS: No  FALLS: Has patient fallen in last 6 months? Yes. Number of falls 1  LIVING ENVIRONMENT: Lives with: lives with their family Lives in: House/apartment Stairs: Yes: Internal: 18 steps; on left going up and External: 1 steps; none Has following equipment at home: Walker - 2 wheeled  PLOF: Independent  PATIENT GOALS: Patient would like to improve her balance, minimize knee pain, return her normal daily activities.  OBJECTIVE:  Note: Objective measures were completed at Evaluation unless otherwise noted.  DIAGNOSTIC FINDINGS: N/A  COGNITION: Overall cognitive status: Within functional limits for tasks assessed   SENSATION: Patient denies change  COORDINATION: Heel slide on shin, WNL  EDEMA:  Patient denies  MUSCLE TONE: WNL  MUSCLE LENGTH: Hamstrings: Right 75 deg; Left 60 deg Thomas test: WNL B.  POSTURE: increased thoracic kyphosis and weight shift right  LOWER EXTREMITY ROM:   WNL B.  LOWER EXTREMITY MMT:  R 5/5  MMT Right Eval Left Eval  Hip flexion  4-  Hip extension  4-  Hip abduction  4-  Hip adduction    Hip internal rotation    Hip external rotation    Knee flexion  4  Knee extension  4  Ankle dorsiflexion  4  Ankle plantarflexion  4-  Ankle inversion    Ankle eversion    (Blank rows = not tested)  BED MOBILITY:  I  TRANSFERS: I  RAMP:  S  CURB:  CGA  STAIRS: Level of Assistance: SBA Stair Negotiation Technique: Step to Pattern with Single Rail on Left Number of Stairs: 18  Height of Stairs: 6  Comments: Patient reports she is stable with a rail  GAIT: Gait pattern: step through pattern, decreased arm swing- Right, decreased arm swing- Left, shuffling, and lateral hip instability Distance walked: in clinic distances Assistive device utilized: None Level of assistance: SBA Comments: Patient reports that she  walks with her son or husband when outside of her house for safety.  FUNCTIONAL TESTS:  5 times sit to stand: 18.81 Berg Balance Scale: 39 Functional gait assessment: 16  TREATMENT DATE:  08/15/23 Education    PATIENT EDUCATION: Education details: POC Person educated: Patient Education method: Explanation Education comprehension: verbalized understanding  HOME EXERCISE PROGRAM: TBD  GOALS: Goals reviewed with patient? Yes  SHORT TERM GOALS: Target date: 09/04/23  I with initial HEP Baseline: Goal status: INITIAL  LONG TERM GOALS: Target date: 10/10/23  I with final HEP Baseline:  Goal status: INITIAL  2.  Complete 5 x STS without UE support in < 15 sec Baseline: 18.8, used UE Goal status: INITIAL  3.  Improve BERG score to at least 45/56 to indicate minimal fall risk Baseline: 39 Goal status: INITIAL  4.  Improve FGA score to at least 24/28 Baseline: 16 Goal status: INITIAL  5.  Increase L LE strength and trunk strength to at least 4+/5 to provide better support and help decrease knee pain. Baseline:  Goal status: INITIAL  6. Patient will walk at least 500' on level and unlevel surfaces I with no instability noted.  Baseline: Multiple gait abnormalities and limitations noted.  Goal Status: INITIAL  ASSESSMENT:  CLINICAL IMPRESSION: Patient is a 68 y.o. who was seen today for physical therapy evaluation and treatment S/P R CVA. She presents with mild L sided weakness, decreased balance, decreased safety and I with all functional mobility. She also has a L visual field cut, and chronic B knee pain. She will benefit from PT to address her deficits in order to to improve her safety and I with all mobility, return to her normal daily activities and independence.  OBJECTIVE IMPAIRMENTS: decreased activity tolerance, decreased balance, decreased  coordination, decreased endurance, decreased mobility, difficulty walking, decreased strength, and postural dysfunction.   ACTIVITY LIMITATIONS: standing, squatting, stairs, and locomotion level  PARTICIPATION LIMITATIONS: meal prep, cleaning, laundry, driving, shopping, and community activity  PERSONAL FACTORS: Past/current experiences are also affecting patient's functional outcome.   REHAB POTENTIAL: Good  CLINICAL DECISION MAKING: Stable/uncomplicated  EVALUATION COMPLEXITY: Moderate  PLAN:  PT FREQUENCY: 2x/week  PT DURATION: 10 weeks  PLANNED INTERVENTIONS: 97110-Therapeutic exercises, 97530- Therapeutic activity, [id number]- Neuromuscular re-education, 97535- Self Care, [id number]- Manual therapy, [phone]- Gait training, [phone]- Electrical stimulation (unattended), Patient/Family education, Balance training, Stair training, and Joint mobilization  PLAN FOR NEXT SESSION: Initiate HEP for strength and balance.   Devere CHRISTELLA Mean, DPT 08/15/2023, 3:21 PM

## 2023-08-15 NOTE — Evaluation (Signed)
 OUTPATIENT PHYSICAL THERAPY NEURO EVALUATION   Patient Name: Maria Rose MRN: [id number] DOB:February 04, 1956, 68 y.o., female Today's Date: 08/15/2023  PCP: N/A REFERRING PROVIDER: Lovie Clarity, MD  END OF SESSION:  PT End of Session - 08/15/23 1503     Visit Number 1    Date for PT Re-Evaluation 10/10/23    PT Start Time 0930    PT Stop Time 1010    PT Time Calculation (min) 40 min    Activity Tolerance Patient tolerated treatment well    Behavior During Therapy [facility] for tasks assessed/performed            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   Past Surgical History:  Procedure Laterality Date   ESOPHAGOGASTRODUODENOSCOPY (EGD) WITH PROPOFOL  N/A 07/31/2023   Procedure: ESOPHAGOGASTRODUODENOSCOPY (EGD) WITH PROPOFOL ;  Surgeon: Stacia Glendia BRAVO, MD;  Location: [facility] ENDOSCOPY;  Service: Gastroenterology;  Laterality: N/A;   HEMOSTASIS CONTROL  07/31/2023   Procedure: HEMOSTASIS CONTROL;  Surgeon: Stacia Glendia BRAVO, MD;  Location: [facility] ENDOSCOPY;  Service: Gastroenterology;;   IR ANGIOGRAM VISCERAL SELECTIVE  08/07/2023   IR ANGIOGRAM VISCERAL SELECTIVE  08/07/2023   IR EMBO VENOUS NOT HEMORR HEMANG  INC GUIDE ROADMAPPING  08/05/2023   IR RENAL SELECTIVE  UNI INC S&I MOD SED  08/07/2023   IR US  GUIDE VASC ACCESS RIGHT  08/05/2023   RADIOLOGY WITH ANESTHESIA N/A 08/05/2023   Procedure: IR WITH ANESTHESIA -TIPS;  Surgeon: Radiologist, Medication, MD;  Location: [facility] OR;  Service: Radiology;  Laterality: N/A;   Patient Active Problem List   Diagnosis Date Noted   Hepatic cirrhosis (HCC) 08/14/2023   Other cirrhosis of liver (HCC) 08/11/2023   Iron  deficiency anemia 08/11/2023   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   Decompensated cirrhosis (HCC) 07/31/2023   Bleeding gastric varices 07/31/2023   Cerebrovascular accident (CVA) (HCC)  07/31/2023   Type 2 diabetes mellitus (HCC) 07/30/2023    ONSET DATE: 08/12/23  REFERRING DIAG: I63.9 (ICD-10-CM) - Stroke (HCC)   THERAPY DIAG:  Difficulty in walking, not elsewhere classified - Plan: PT plan of care cert/re-cert  Muscle weakness (generalized) - Plan: PT plan of care cert/re-cert  Other abnormalities of gait and mobility - Plan: PT plan of care cert/re-cert  Other lack of coordination - Plan: PT plan of care cert/re-cert  Unsteadiness on feet - Plan: PT plan of care cert/re-cert  Cerebrovascular accident (CVA), unspecified mechanism (HCC) - Plan: PT plan of care cert/re-cert  Rationale for Evaluation and Treatment: Rehabilitation  SUBJECTIVE:  SUBJECTIVE STATEMENT: Patient reports recent stroke. She has vision issues and some weakness on her L side. Her knees ache and she has H/O B ankle/foot injuries. Pt accompanied by: self  PERTINENT HISTORY:  Per hospital PT evaluation note: 68 y.o. female presents to [facility] hospital on 07/30/2023 after a fall and shaking episode. 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 Additional punctate acute infarcts in right cerebellum, left temporal lobe, left occipital lobe, right occipital lobe, right parietal lobe, right frontal lobe, right thalamus, left frontal lobe. Pt with bloody bowel movement and hematemesis on 12/24. PMH includes hepatic cirrhosis, gastric and splenic varices, sepsis.   PAIN:  Are you having pain? Yes: NPRS scale: 8/10 Pain location: B knees and bottoms of her feet. Pain description: Chronic Aggravating factors: Cold weather, too much walking. Relieving factors: Has knee brace which helps, was taking Allieve, but had to stop  since stroke.  PRECAUTIONS: Fall and Other: L visual field cut  RED FLAGS: None   WEIGHT BEARING RESTRICTIONS: No  FALLS: Has patient fallen in last 6 months? Yes. Number of falls 1  LIVING ENVIRONMENT: Lives with: lives with their family Lives in: House/apartment Stairs: Yes: Internal: 18 steps; on left going up and External: 1 steps; none Has following equipment at home: Walker - 2 wheeled  PLOF: Independent  PATIENT GOALS: Patient would like to improve her balance, minimize knee pain, return her normal daily activities.  OBJECTIVE:  Note: Objective measures were completed at Evaluation unless otherwise noted.  DIAGNOSTIC FINDINGS: N/A  COGNITION: Overall cognitive status: Within functional limits for tasks assessed   SENSATION: Patient denies change  COORDINATION: Heel slide on shin, WNL  EDEMA:  Patient denies  MUSCLE TONE: WNL  MUSCLE LENGTH: Hamstrings: Right 75 deg; Left 60 deg Thomas test: WNL B.  POSTURE: increased thoracic kyphosis and weight shift right  LOWER EXTREMITY ROM:   WNL B.  LOWER EXTREMITY MMT:  R 5/5  MMT Right Eval Left Eval  Hip flexion  4-  Hip extension  4-  Hip abduction  4-  Hip adduction    Hip internal rotation    Hip external rotation    Knee flexion  4  Knee extension  4  Ankle dorsiflexion  4  Ankle plantarflexion  4-  Ankle inversion    Ankle eversion    (Blank rows = not tested)  BED MOBILITY:  I  TRANSFERS: I  RAMP:  S  CURB:  CGA  STAIRS: Level of Assistance: SBA Stair Negotiation Technique: Step to Pattern with Single Rail on Left Number of Stairs: 18  Height of Stairs: 6  Comments: Patient reports she is stable with a rail  GAIT: Gait pattern: step through pattern, decreased arm swing- Right, decreased arm swing- Left, shuffling, and lateral hip instability Distance walked: in clinic distances Assistive device utilized: None Level of assistance: SBA Comments: Patient reports that she  walks with her son or husband when outside of her house for safety.  FUNCTIONAL TESTS:  5 times sit to stand: 18.81 Berg Balance Scale: 39 Functional gait assessment: 16  TREATMENT DATE:  08/15/23 Education    PATIENT EDUCATION: Education details: POC Person educated: Patient Education method: Explanation Education comprehension: verbalized understanding  HOME EXERCISE PROGRAM: TBD  GOALS: Goals reviewed with patient? Yes  SHORT TERM GOALS: Target date: 09/04/23  I with initial HEP Baseline: Goal status: INITIAL  LONG TERM GOALS: Target date: 10/10/23  I with final HEP Baseline:  Goal status: INITIAL  2.  Complete 5 x STS without UE support in < 15 sec Baseline: 18.8, used UE Goal status: INITIAL  3.  Improve BERG score to at least 45/56 to indicate minimal fall risk Baseline: 39 Goal status: INITIAL  4.  Improve FGA score to at least 24/28 Baseline: 16 Goal status: INITIAL  5.  Increase L LE strength and trunk strength to at least 4+/5 to provide better support and help decrease knee pain. Baseline:  Goal status: INITIAL  6. Patient will walk at least 500' on level and unlevel surfaces I with no instability noted.  Baseline: Multiple gait abnormalities and limitations noted.  Goal Status: INITIAL  ASSESSMENT:  CLINICAL IMPRESSION: Patient is a 68 y.o. who was seen today for physical therapy evaluation and treatment S/P R CVA. She presents with mild L sided weakness, decreased balance, decreased safety and I with all functional mobility. She also has a L visual field cut, and chronic B knee pain. She will benefit from PT to address her deficits in order to to improve her safety and I with all mobility, return to her normal daily activities and independence.  OBJECTIVE IMPAIRMENTS: decreased activity tolerance, decreased balance, decreased  coordination, decreased endurance, decreased mobility, difficulty walking, decreased strength, and postural dysfunction.   ACTIVITY LIMITATIONS: standing, squatting, stairs, and locomotion level  PARTICIPATION LIMITATIONS: meal prep, cleaning, laundry, driving, shopping, and community activity  PERSONAL FACTORS: Past/current experiences are also affecting patient's functional outcome.   REHAB POTENTIAL: Good  CLINICAL DECISION MAKING: Stable/uncomplicated  EVALUATION COMPLEXITY: Moderate  PLAN:  PT FREQUENCY: 2x/week  PT DURATION: 10 weeks  PLANNED INTERVENTIONS: 97110-Therapeutic exercises, 97530- Therapeutic activity, [id number]- Neuromuscular re-education, 97535- Self Care, 02859- Manual therapy, [phone]- Gait training, [phone]- Electrical stimulation (unattended), Patient/Family education, Balance training, Stair training, and Joint mobilization  PLAN FOR NEXT SESSION: Initiate HEP for strength and balance.   Devere CHRISTELLA Mean, DPT 08/15/2023, 3:21 PM

## 2023-08-15 NOTE — Therapy (Signed)
 .. 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   Past Surgical History:  Procedure Laterality Date   ESOPHAGOGASTRODUODENOSCOPY (EGD) WITH PROPOFOL  N/A 07/31/2023   Procedure: ESOPHAGOGASTRODUODENOSCOPY (EGD) WITH PROPOFOL ;  Surgeon: Stacia Glendia BRAVO, MD;  Location: [facility] ENDOSCOPY;  Service: Gastroenterology;  Laterality: N/A;   HEMOSTASIS CONTROL  07/31/2023   Procedure: HEMOSTASIS CONTROL;  Surgeon: Stacia Glendia BRAVO, MD;  Location: [facility] ENDOSCOPY;  Service: Gastroenterology;;   IR ANGIOGRAM VISCERAL SELECTIVE  08/07/2023   IR ANGIOGRAM VISCERAL SELECTIVE  08/07/2023   IR EMBO VENOUS NOT HEMORR HEMANG  INC GUIDE ROADMAPPING  08/05/2023   IR RENAL SELECTIVE  UNI INC S&I MOD SED  08/07/2023   IR US  GUIDE VASC ACCESS RIGHT  08/05/2023   RADIOLOGY WITH ANESTHESIA N/A 08/05/2023   Procedure: IR WITH ANESTHESIA -TIPS;  Surgeon: Radiologist, Medication, MD;  Location: [facility] OR;  Service: Radiology;  Laterality: N/A;   Patient Active Problem List   Diagnosis Date Noted   Hepatic cirrhosis (HCC) 08/14/2023   Other cirrhosis of liver (HCC) 08/11/2023   Iron  deficiency anemia 08/11/2023   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 [name] Pt accompanied by: self  PERTINENT HISTORY: Per chart review: 68 y.o. female CVA; PMHx includes hepatic cirrhosis, gastric and splenic varices, sepsis.   PAIN:  Are you having pain? No  FALLS: Has patient fallen in last 6 months?  No; other than with her stroke   LIVING ENVIRONMENT: Lives with: lives with their family; husband [name]) and son [name])  Lives in: House/apartment  PLOF:  Level of assistance: Independent with ADLs, Independent with IADLs Employment: Retired; lawyer   PATIENT GOALS: NA  OBJECTIVE:  Note: Objective measures were completed at Evaluation unless otherwise noted.  DIAGNOSTIC FINDINGS: Per chart review:  MR Brain W and Wo Contrast  IMPRESSION: 1. Acute to early subacute infarct in the right posterior temporal and lateral occipital and parietal cortex, with associated edema and petechial hemorrhage. 2. Additional punctate acute infarcts in the right cerebellum, left temporal lobe, left occipital lobe, right occipital lobe, right parietal lobe, right frontal lobe, right thalamus, and left frontal lobe. These foci are both in the white matter and cortex. The distribution is suggestive of a central embolic phenomenon.   These results will be called to the ordering clinician or representative by the Radiologist Assistant, and communication documented in the  PACS or [employer].     Electronically Signed   By: Donald Campion M.D.   On: 07/30/2023 22:55    COGNITION: Overall cognitive status: Impaired Areas of impairment:  Executive function: Impaired: Organization and Planning Functional deficits: Reports no difficulty at home. Reports son has  not reported any difficulty.  COGNITIVE COMMUNICATION: Following directions: Follows multi-step commands consistently  Auditory comprehension: WFL Verbal expression: WFL Functional communication: WFL  ORAL MOTOR EXAMINATION: Overall status: Did not assess Comments: NA; reports no changes to speech/swallowing. Adequate for speech production.  STANDARDIZED ASSESSMENTS: SLUMS: 25/30  Missed points: Clock drawing 0/4, Naming 2/3   Medication management task: 3/3; completed efficiently  PATIENT REPORTED OUTCOME MEASURES (PROM): NA                                                                                                                             PATIENT EDUCATION: Education details: SLP role Person educated: Patient Education method: Explanation Education comprehension: verbalized understanding   GOALS: Goals reviewed with patient? No NA  ASSESSMENT:  CLINICAL IMPRESSION: Pt is a 68 yo female who presents to [facility] for evaluation post CVA. Pt endorses no difficulty with thinking skills, swallowing, speech or language that she is aware of. Pt reports husband and son have not reported noticed any changes at home. Pt reports she is open to therapy if we identify any needs. Due to pt with limited concerns, pt was assessed using SLUMS screen.  Pt scored a 25/30 with errors in clock drawing (also reported by OT), and decreased ability to generate names of animals in 1 minute. SLP administered informal medication management task where she was asked to organize 3 different medications for the week using a pill box (AM/PM). Pt was able to complete efficiently with 100% accuracy. Only impairments observed in today's session were related to organization/planning on clock drawing. Pt reports her bills are to be put on auto pay, she has gotten a calendar to assist with managing appointments, and that her son is helpful at home. OT is to address vision and executive functioning in her POC.  Pt understands if anything changes and she believes she is having trouble with daily tasks, she can return to SLP for more complete evaluation. SLP does not rec skilled services at this time as pt has no concerns and OT can address mild impairment in executive functioning.     OBJECTIVE IMPAIRMENTS: include executive functioning. These impairments are limiting patient from  NA . Factors affecting potential to achieve goals and functional outcome are  NA .SABRA Patient will benefit from skilled SLP services to address above impairments and improve overall function.  REHAB POTENTIAL: NA  PLAN:  SLP FREQUENCY: one time visit  SLP DURATION: other: NA  PLANNED INTERVENTIONS:  NA    [employer], CCC-SLP 08/15/2023, 11:58 AM    :{

## 2023-08-20 ENCOUNTER — Encounter: Payer: Self-pay | Admitting: Physical Therapy

## 2023-08-20 ENCOUNTER — Ambulatory Visit: Payer: Commercial Managed Care - PPO | Admitting: Occupational Therapy

## 2023-08-20 ENCOUNTER — Ambulatory Visit: Payer: Commercial Managed Care - PPO | Admitting: Physical Therapy

## 2023-08-20 DIAGNOSIS — R2681 Unsteadiness on feet: Secondary | ICD-10-CM

## 2023-08-20 DIAGNOSIS — R278 Other lack of coordination: Secondary | ICD-10-CM

## 2023-08-20 DIAGNOSIS — M6281 Muscle weakness (generalized): Secondary | ICD-10-CM

## 2023-08-20 DIAGNOSIS — R41842 Visuospatial deficit: Secondary | ICD-10-CM

## 2023-08-20 DIAGNOSIS — R41844 Frontal lobe and executive function deficit: Secondary | ICD-10-CM

## 2023-08-20 DIAGNOSIS — R2689 Other abnormalities of gait and mobility: Secondary | ICD-10-CM

## 2023-08-20 DIAGNOSIS — R41840 Attention and concentration deficit: Secondary | ICD-10-CM

## 2023-08-20 DIAGNOSIS — R262 Difficulty in walking, not elsewhere classified: Secondary | ICD-10-CM

## 2023-08-20 DIAGNOSIS — I639 Cerebral infarction, unspecified: Secondary | ICD-10-CM

## 2023-08-20 NOTE — Therapy (Signed)
 OUTPATIENT PHYSICAL THERAPY NEURO EVALUATION   Patient Name: Maria Rose MRN: [id number] DOB:February 21, 1956, 68 y.o., female Today's Date: 08/15/2023  PCP: N/A REFERRING PROVIDER: Lovie Clarity, MD  END OF SESSION:  PT End of Session - 08/15/23 1503     Visit Number 1    Date for PT Re-Evaluation 10/10/23    PT Start Time 0930    PT Stop Time 1010    PT Time Calculation (min) 40 min    Activity Tolerance Patient tolerated treatment well    Behavior During Therapy [facility] for tasks assessed/performed            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   Past Surgical History:  Procedure Laterality Date   ESOPHAGOGASTRODUODENOSCOPY (EGD) WITH PROPOFOL  N/A 07/31/2023   Procedure: ESOPHAGOGASTRODUODENOSCOPY (EGD) WITH PROPOFOL ;  Surgeon: Stacia Glendia BRAVO, MD;  Location: [facility] ENDOSCOPY;  Service: Gastroenterology;  Laterality: N/A;   HEMOSTASIS CONTROL  07/31/2023   Procedure: HEMOSTASIS CONTROL;  Surgeon: Stacia Glendia BRAVO, MD;  Location: [facility] ENDOSCOPY;  Service: Gastroenterology;;   IR ANGIOGRAM VISCERAL SELECTIVE  08/07/2023   IR ANGIOGRAM VISCERAL SELECTIVE  08/07/2023   IR EMBO VENOUS NOT HEMORR HEMANG  INC GUIDE ROADMAPPING  08/05/2023   IR RENAL SELECTIVE  UNI INC S&I MOD SED  08/07/2023   IR US  GUIDE VASC ACCESS RIGHT  08/05/2023   RADIOLOGY WITH ANESTHESIA N/A 08/05/2023   Procedure: IR WITH ANESTHESIA -TIPS;  Surgeon: Radiologist, Medication, MD;  Location: [facility] OR;  Service: Radiology;  Laterality: N/A;   Patient Active Problem List   Diagnosis Date Noted   Hepatic cirrhosis (HCC) 08/14/2023   Other cirrhosis of liver (HCC) 08/11/2023   Iron  deficiency anemia 08/11/2023   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   Decompensated cirrhosis (HCC) 07/31/2023   Bleeding gastric varices 07/31/2023   Cerebrovascular accident (CVA) (HCC)  07/31/2023   Type 2 diabetes mellitus (HCC) 07/30/2023    ONSET DATE: 08/12/23  REFERRING DIAG: I63.9 (ICD-10-CM) - Stroke (HCC)   THERAPY DIAG:  Difficulty in walking, not elsewhere classified - Plan: PT plan of care cert/re-cert  Muscle weakness (generalized) - Plan: PT plan of care cert/re-cert  Other abnormalities of gait and mobility - Plan: PT plan of care cert/re-cert  Other lack of coordination - Plan: PT plan of care cert/re-cert  Unsteadiness on feet - Plan: PT plan of care cert/re-cert  Cerebrovascular accident (CVA), unspecified mechanism (HCC) - Plan: PT plan of care cert/re-cert  Rationale for Evaluation and Treatment: Rehabilitation  SUBJECTIVE:  SUBJECTIVE STATEMENT: Some balance issues, no falls Patient reports recent stroke. She has vision issues and some weakness on her L side. Her knees ache and she has H/O B ankle/foot injuries. Pt accompanied by: self  PERTINENT HISTORY:  Per hospital PT evaluation note: 68 y.o. female presents to [facility] hospital on 07/30/2023 after a fall and shaking episode. 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 Additional punctate acute infarcts in right cerebellum, left temporal lobe, left occipital lobe, right occipital lobe, right parietal lobe, right frontal lobe, right thalamus, left frontal lobe. Pt with bloody bowel movement and hematemesis on 12/24. PMH includes hepatic cirrhosis, gastric and splenic varices, sepsis.   PAIN:  Are you having pain? Yes: NPRS scale: 8/10 Pain location: B knees and bottoms of her feet. Pain description: Chronic Aggravating factors: Cold weather, too much walking. Relieving factors: Has knee brace which helps, was  taking Allieve, but had to stop since stroke.  PRECAUTIONS: Fall and Other: L visual field cut  RED FLAGS: None   WEIGHT BEARING RESTRICTIONS: No  FALLS: Has patient fallen in last 6 months? Yes. Number of falls 1  LIVING ENVIRONMENT: Lives with: lives with their family Lives in: House/apartment Stairs: Yes: Internal: 18 steps; on left going up and External: 1 steps; none Has following equipment at home: Walker - 2 wheeled  PLOF: Independent  PATIENT GOALS: Patient would like to improve her balance, minimize knee pain, return her normal daily activities.  OBJECTIVE:  Note: Objective measures were completed at Evaluation unless otherwise noted.  DIAGNOSTIC FINDINGS: N/A  COGNITION: Overall cognitive status: Within functional limits for tasks assessed   SENSATION: Patient denies change  COORDINATION: Heel slide on shin, WNL  EDEMA:  Patient denies  MUSCLE TONE: WNL  MUSCLE LENGTH: Hamstrings: Right 75 deg; Left 60 deg Thomas test: WNL B.  POSTURE: increased thoracic kyphosis and weight shift right  LOWER EXTREMITY ROM:   WNL B.  LOWER EXTREMITY MMT:  R 5/5  MMT Right Eval Left Eval  Hip flexion  4-  Hip extension  4-  Hip abduction  4-  Hip adduction    Hip internal rotation    Hip external rotation    Knee flexion  4  Knee extension  4  Ankle dorsiflexion  4  Ankle plantarflexion  4-  Ankle inversion    Ankle eversion    (Blank rows = not tested)  BED MOBILITY:  I  TRANSFERS: I  RAMP:  S  CURB:  CGA  STAIRS: Level of Assistance: SBA Stair Negotiation Technique: Step to Pattern with Single Rail on Left Number of Stairs: 18  Height of Stairs: 6  Comments: Patient reports she is stable with a rail  GAIT: Gait pattern: step through pattern, decreased arm swing- Right, decreased arm swing- Left, shuffling, and lateral hip instability Distance walked: in clinic distances Assistive device utilized: None Level of assistance:  SBA Comments: Patient reports that she walks with her son or husband when outside of her house for safety.  FUNCTIONAL TESTS:  5 times sit to stand: 18.81 Berg Balance Scale: 39 Functional gait assessment: 16  TREATMENT DATE:  08/20/23 Nustep level 5 x 6 minutes Leg curls 20# 2x10 LAQ 2x10 2.5# - pulls on left knee Step on and off airex In pbars step turn touch In front of bench feet stationary turn and touch cone to side and behind Then did the same on airex, had a loss of balance and went back onto bench sitting On airex catch and volleyball Turn and toss with feet stationary Walking ball toss  08/15/23 Education    PATIENT EDUCATION: Education details: POC Person educated: Patient Education method: Explanation Education comprehension: verbalized understanding  HOME EXERCISE PROGRAM: TBD  GOALS: Goals reviewed with patient? Yes  SHORT TERM GOALS: Target date: 09/04/23  I with initial HEP Baseline: Goal status: INITIAL  LONG TERM GOALS: Target date: 10/10/23  I with final HEP Baseline:  Goal status: INITIAL  2.  Complete 5 x STS without UE support in < 15 sec Baseline: 18.8, used UE Goal status: INITIAL  3.  Improve BERG score to at least 45/56 to indicate minimal fall risk Baseline: 39 Goal status: INITIAL  4.  Improve FGA score to at least 24/28 Baseline: 16 Goal status: INITIAL  5.  Increase L LE strength and trunk strength to at least 4+/5 to provide better support and help decrease knee pain. Baseline:  Goal status: INITIAL  6. Patient will walk at least 500' on level and unlevel surfaces I with no instability noted.  Baseline: Multiple gait abnormalities and limitations noted.  Goal Status: INITIAL  ASSESSMENT:  CLINICAL IMPRESSION: Started gym activities for strength and function and then started working on balance, she  had a few instances of LOB with higher level activities, struggled on the airx and sat quickly on the mat table due to LOB twice  Patient is a 68 y.o. who was seen today for physical therapy evaluation and treatment S/P R CVA. She presents with mild L sided weakness, decreased balance, decreased safety and I with all functional mobility. She also has a L visual field cut, and chronic B knee pain. She will benefit from PT to address her deficits in order to to improve her safety and I with all mobility, return to her normal daily activities and independence.  OBJECTIVE IMPAIRMENTS: decreased activity tolerance, decreased balance, decreased coordination, decreased endurance, decreased mobility, difficulty walking, decreased strength, and postural dysfunction.   ACTIVITY LIMITATIONS: standing, squatting, stairs, and locomotion level  PARTICIPATION LIMITATIONS: meal prep, cleaning, laundry, driving, shopping, and community activity  PERSONAL FACTORS: Past/current experiences are also affecting patient's functional outcome.   REHAB POTENTIAL: Good  CLINICAL DECISION MAKING: Stable/uncomplicated  EVALUATION COMPLEXITY: Moderate  PLAN:  PT FREQUENCY: 2x/week  PT DURATION: 10 weeks  PLANNED INTERVENTIONS: 97110-Therapeutic exercises, 97530- Therapeutic activity, [id number]- Neuromuscular re-education, 97535- Self Care, 02859- Manual therapy, [phone]- Gait training, [phone]- Electrical stimulation (unattended), Patient/Family education, Balance training, Stair training, and Joint mobilization  PLAN FOR NEXT SESSION: Initiate HEP for strength and balance.   Ozell Mainland, PT 08/15/2023, 3:21 PM

## 2023-08-20 NOTE — Therapy (Signed)
 OUTPATIENT OCCUPATIONAL THERAPY NEURO EVALUATION  Patient Name: Maria Rose MRN: [id number] DOB:[birth year], 68 y.o., female Today's Date: 08/20/2023  PCP: none REFERRING PROVIDER: Lovie Clarity, MD  END OF SESSION:  OT End of Session - 08/20/23 1020     Visit Number 2    Number of Visits 25    Date for OT Re-Evaluation 11/07/23    Authorization Type [employer]    Authorization - Visit Number 1    Progress Note Due on Visit 10    OT Start Time 1019    OT Stop Time 1058    OT Time Calculation (min) 39 min              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   Past Surgical History:  Procedure Laterality Date   ESOPHAGOGASTRODUODENOSCOPY (EGD) WITH PROPOFOL  N/A 07/31/2023   Procedure: ESOPHAGOGASTRODUODENOSCOPY (EGD) WITH PROPOFOL ;  Surgeon: Stacia Glendia BRAVO, MD;  Location: [facility] ENDOSCOPY;  Service: Gastroenterology;  Laterality: N/A;   HEMOSTASIS CONTROL  07/31/2023   Procedure: HEMOSTASIS CONTROL;  Surgeon: Stacia Glendia BRAVO, MD;  Location: [facility] ENDOSCOPY;  Service: Gastroenterology;;   IR ANGIOGRAM VISCERAL SELECTIVE  08/07/2023   IR ANGIOGRAM VISCERAL SELECTIVE  08/07/2023   IR EMBO VENOUS NOT HEMORR HEMANG  INC GUIDE ROADMAPPING  08/05/2023   IR RENAL SELECTIVE  UNI INC S&I MOD SED  08/07/2023   IR US  GUIDE VASC ACCESS RIGHT  08/05/2023   RADIOLOGY WITH ANESTHESIA N/A 08/05/2023   Procedure: IR WITH ANESTHESIA -TIPS;  Surgeon: Radiologist, Medication, MD;  Location: [facility] OR;  Service: Radiology;  Laterality: N/A;   Patient Active Problem List   Diagnosis Date Noted   Hepatic cirrhosis (HCC) 08/14/2023   Other cirrhosis of liver (HCC) 08/11/2023   Iron  deficiency anemia 08/11/2023   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   Decompensated cirrhosis (HCC) 07/31/2023   Bleeding gastric varices 07/31/2023   Cerebrovascular accident  (CVA) (HCC) 07/31/2023   Type 2 diabetes mellitus (HCC) 07/30/2023    ONSET DATE: 07/30/23  REFERRING DIAG: I63.9 (ICD-10-CM) - Stroke (HCC)   THERAPY DIAG:  Visuospatial deficit  Frontal lobe and executive function deficit  Attention and concentration deficit  Muscle weakness (generalized)  Other lack of coordination  Unsteadiness on feet  Rationale for Evaluation and Treatment: Rehabilitation  SUBJECTIVE:   SUBJECTIVE STATEMENT: Pt reports she went to the grocery store and it went ok Pt accompanied by: self  PERTINENT HISTORY: Per chart, 67 y.o. female presented to [facility] on 07/30/2023 after a fall and shaking episode. 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 Additional punctate acute infarcts in right cerebellum, left temporal lobe, left occipital lobe, right occipital lobe, right parietal lobe, right frontal lobe, right thalamus, left frontal lobe. Pt with bloody bowel movement and hematemesis on 12/24.  While hospitalized  she suffered a brisk upper GI bleed associated with profound hypotension and hemoglobin drop from 10 to 5.  She underwent an upper endoscopy which showed grade 1 varices and gastric varices with bleeding stigmata, attributed as the bleeding source.  She underwent BRTO on December 29 and a subsequent CT angiogram showed successful occlusion of the gastric varices with splenorenal shunt, but did show an acute nonocclusive SMV thrombus.  She was started on IV heparin  and transitioned  to oral anticoagulation. Prior to this admission, the patient had no idea she had liver disease or cirrhosis.  PMH includes hepatic cirrhosis, gastric and splenic varices, sepsis. Pt d/c home 08/12/23   PRECAUTIONS: Fall  WEIGHT BEARING RESTRICTIONS: No  PAIN:  Are you having pain? No  FALLS: Has patient fallen in last 6 months? No  LIVING ENVIRONMENT: Lives with:  lives with their family Lives in: House/apartment Stairs: Yes: Internal: 15 steps; on right going up and on left going up Has following equipment at home: Vannie - 2 wheeled  PLOF: Independent  PATIENT GOALS: maintian I   OBJECTIVE:  Note: Objective measures were completed at Evaluation unless otherwise noted.  HAND DOMINANCE: Left  ADLs: Overall ADLs: supervision-mod I with all basic ADLs. Transfers/ambulation related to ADLs: Eating: mod I Grooming: mod I UB Dressing: mod I LB Dressing: mod I Toileting: supervision Bathing: sponge bath Tub Shower transfers: not performing   IADLs:  Meal Prep: supervision  for simple breakfast prep, has not attempted previosly level of cooking Community mobility: supervision Medication management: keeps up with meds , has pillbox but has not set up Financial management: has some auto bill pay set up Handwriting: 100% legible  MOBILITY STATUS: supervision  POSTURE COMMENTS:    ACTIVITY TOLERANCE: Activity tolerance: impaired per pt report   UPPER EXTREMITY ROM:  WFLS    UPPER EXTREMITY MMT:     MMT Right eval Left eval  Shoulder flexion 4+/5 4-/5  Shoulder abduction    Shoulder adduction    Shoulder extension    Shoulder internal rotation    Shoulder external rotation    Middle trapezius    Lower trapezius    Elbow flexion 4+/5 4+/5  Elbow extension 4+/5 4/5  Wrist flexion    Wrist extension    Wrist ulnar deviation    Wrist radial deviation    Wrist pronation    Wrist supination    (Blank rows = not tested)  HAND FUNCTION: Grip strength: Right: 30 lbs; Left: 45 lbs  COORDINATION: 9 Hole Peg test: Right: 31.39 sec; Left: 34.09 sec  SENSATION: WFL   COGNITION: Overall cognitive status: Impaired-  impaired short term meory, 2/3 words recalled after short delay, spells WORLD backwards without difficulty. Clock drawing task: pt wrote numbers backwards on clock face with 11, 10 , 9 on right side of clock.   Per speech therapy pt completed medication management task correctly.   VISION: Subjective report: Pt reports something is off Baseline vision:  usually wears contacts   VISION ASSESSMENT: Gaze preference/alignment: gaze right Tracking/Visual pursuits: Decreased smoothness of eye movement to Left  visual field Saccades: impaired Visual Fields: Left visual field deficits   PERCEPTION: Impaired: Inattention/neglect: does not attend to left visual field, number cancellation- 2/80 missed on left margin- discorganized scan pattern    OBSERVATIONS: Pleasant female who appears overwhelmed by current situation. She had not seen MD in many years prior to CVA  TREATMENT DATE: 08/20/23-Environemntal scanning in a moderately busy environment 5/14 (64%)items missed on first pass. Min questioning cues to locate remainder of items, 3/5 items were on left side  Copying small peg design with LUE for fine motor coordination with a visual/ cognitve component, pt completed correctly without v.c Word search to encourage visual scanning in organized fashion, 17 words located in 8 mins, min v.c for line guide   08/15/23 eval        PATIENT EDUCATION: Education details:visual scanning strategies, activities to encourage visual scanning Person educated: Patient Education method: Chief Technology Officer Education comprehension: verbalized understanding  HOME EXERCISE PROGRAM: not issued yet   GOALS: Goals reviewed with patient? Yes  SHORT TERM GOALS: Target date: 09/14/23  I with inital HEP including LUE coordination  Goal status: INITIAL  2.  Pt will verbalize understanding of compensatory strategies for visual deficits.  Goal status: INITIAL  3.  Pt will perform tabletop scanning activities with a cognitve component with  95% or better accuracy.  Goal status:  INITIAL  4.  Pt will perform basic environmental scanning with 90% or better accuracy.  Goal status: INITIAL  5.  Pt will perfrom all basic ADLs mod I  Goal status: INITIAL 6. Pt will verbalize understanding of compensatory strategies for cognitve deficits. Goal status: new    LONG TERM GOALS: Target date: 11/07/23  I with updated HEP proximal strength  Goal status: INITIAL  2.  Pt will perform a physical and cogntive task simultaneously with 90% or better accuracy.  Goal status: INITIAL  3.  Pt will perform mod complex environmental scanning( with a cognitve component) with 90% or better accuracy.  Goal status: INITIAL  4.  Pt will perform a functional organization/ planning  task with 90% or better accuracy.( generating a grocery list/ meal planning etc)  Goal status: INITIAL  5.  Pt will resume cooking and performing home management activities mod I   Goal status: INITIAL   ASSESSMENT:  CLINICAL IMPRESSION: Patient is a progressing towards goals. she verbalizes understanding of scanning strategies.PERFORMANCE DEFICITS: in functional skills including ADLs, IADLs, coordination, dexterity, strength, Fine motor control, mobility, balance, decreased knowledge of precautions, decreased knowledge of use of DME, vision, and UE functional use, cognitive skills including attention, memory, perception, problem solving, and safety awareness, and psychosocial skills including coping strategies, environmental adaptation, habits, interpersonal interactions, and routines and behaviors.   IMPAIRMENTS: are limiting patient from ADLs, IADLs, play, leisure, and social participation.   CO-MORBIDITIES: may have co-morbidities  that affects occupational performance. Patient will benefit from skilled OT to address above impairments and improve overall function.  MODIFICATION OR ASSISTANCE TO COMPLETE EVALUATION: No modification of tasks or assist necessary to complete an evaluation.  OT  OCCUPATIONAL PROFILE AND HISTORY: Detailed assessment: Review of records and additional review of physical, cognitive, psychosocial history related to current functional performance.  CLINICAL DECISION MAKING: LOW - limited treatment options, no task modification necessary  REHAB POTENTIAL: Good  EVALUATION COMPLEXITY: Low    PLAN:  OT FREQUENCY: 2x/week  OT DURATION: 12 weeks- may d/c sooner based upon pt progress.  PLANNED INTERVENTIONS: 97168 OT Re-evaluation, 97535 self care/ADL training, 02889 therapeutic exercise, 97530 therapeutic activity, 97112 neuromuscular re-education, 97140 manual therapy, 97035 ultrasound, 97018 paraffin, 02989 moist heat, 97010 cryotherapy, 97034 contrast bath, 97129 Cognitive training (first 15 min), 02869 Cognitive training(each additional 15 min), passive range of motion, balance training, functional mobility training, visual/perceptual remediation/compensation, energy conservation, coping strategies training, patient/family education, and DME  and/or AE instructions  RECOMMENDED OTHER SERVICES: PT  CONSULTED AND AGREED WITH PLAN OF CARE: Patient  PLAN FOR NEXT SESSION functional cognition with a visual component    Kollyn Lingafelter, OT 08/20/2023, 10:54 AM

## 2023-08-20 NOTE — Patient Instructions (Signed)
 Scanning strategies  1. Look for the edge of objects (to the left and/or right) so that you make sure you are seeing all of an object 2. Turn your head when walking, scan from side to side, particularly in busy environments 3. Use an organized scanning pattern. It's usually easier to scan from top to bottom, and left to right (like you are reading) 4. Double check yourself 5. Use a line guide (like a blank piece of paper) or your finger when reading 6. If necessary, place brightly colored tape at end of table or work area as a reminder to always look until you see the tape.   1. Word searches 2. Mazes 3. Puzzles 4. Card games 5. Computer games and/or searches 6. Connect-the-dots  Activities for environmental (larger) scanning:  1. With supervision, scan for items in grocery store or drugstore.  Begin with a familiar store, then progress to a new store you've never been in before. Make sure you have supervision with this.  2. With supervision, tell a family member or caregiver when it is safe to cross a street after looking all directions and any side streets. However, do NOT cross street unless family member or caregiver is with you and says it is OK

## 2023-08-22 ENCOUNTER — Ambulatory Visit: Payer: Commercial Managed Care - PPO | Admitting: Student

## 2023-08-22 VITALS — BP 144/74 | HR 99 | Temp 98.1°F | Ht 65.0 in | Wt 189.5 lb

## 2023-08-22 DIAGNOSIS — K7460 Unspecified cirrhosis of liver: Secondary | ICD-10-CM | POA: Diagnosis not present

## 2023-08-22 DIAGNOSIS — Z794 Long term (current) use of insulin: Secondary | ICD-10-CM

## 2023-08-22 DIAGNOSIS — D509 Iron deficiency anemia, unspecified: Secondary | ICD-10-CM

## 2023-08-22 DIAGNOSIS — E119 Type 2 diabetes mellitus without complications: Secondary | ICD-10-CM

## 2023-08-22 DIAGNOSIS — E1100 Type 2 diabetes mellitus with hyperosmolarity without nonketotic hyperglycemic-hyperosmolar coma (NKHHC): Secondary | ICD-10-CM

## 2023-08-22 DIAGNOSIS — I639 Cerebral infarction, unspecified: Secondary | ICD-10-CM

## 2023-08-22 DIAGNOSIS — K7290 Hepatic failure, unspecified without coma: Secondary | ICD-10-CM

## 2023-08-22 LAB — POCT GLYCOSYLATED HEMOGLOBIN (HGB A1C): Hemoglobin A1C: 6.9 % — AB (ref 4.0–5.6)

## 2023-08-22 LAB — GLUCOSE, CAPILLARY: Glucose-Capillary: 148 mg/dL — ABNORMAL HIGH (ref 70–99)

## 2023-08-22 NOTE — Progress Notes (Signed)
New Patient Office Visit  Subjective    Patient ID: Maria Rose, female    DOB: 04/26/1956  Age: 68 y.o. MRN: [id number]  CC:  Chief Complaint  Patient presents with   Follow-up   HPI Maria Rose presents to establish care. She is accompanied by her son, Jilda Panda. Patient requesting disability parking placard, completed form today.   Patient was seen by the I[facility] during her recent hospitalization at [facility] 07/30/23-08/12/23 for acute embolic stroke. She was diagnosed with decompensated cirrhosis, T2DM, microcytic anemia, and found to have an incidental small lung nodule on CT during admission.   Outpatient Encounter Medications as of 08/22/2023  Medication Sig   apixaban (ELIQUIS) 5 MG TABS tablet Take 1 tablet (5 mg total) by mouth 2 (two) times daily.   [START ON 09/12/2023] apixaban (ELIQUIS) 5 MG TABS tablet Take 1 tablet (5 mg total) by mouth 2 (two) times daily.   Blood Glucose Monitoring Suppl DEVI 1 each by Does not apply route 3 (three) times daily. May dispense any manufacturer covered by patient's insurance.   carvedilol (COREG) 3.125 MG tablet Take 1 tablet (3.125 mg total) by mouth 2 (two) times daily with a meal.   clotrimazole (LOTRIMIN) 1 % cream Apply to affected area 2 times daily   erythromycin ophthalmic ointment Place 1 Application into the left eye 4 (four) times daily.   Glucagon, rDNA, (GLUCAGON EMERGENCY) 1 MG KIT Inject 1 mg into the skin as needed for up to 2 doses (Severe low blood sugar).   Glucose Blood (BLOOD GLUCOSE TEST STRIPS) STRP 1 each by Does not apply route 3 (three) times daily. Use as directed to check blood sugar. May dispense any manufacturer covered by patient's insurance and fits patient's device.   Insulin Glargine (BASAGLAR KWIKPEN) 100 UNIT/ML Inject 30 Units into the skin daily. May substitute as needed per insurance.   Insulin Pen Needle (PEN NEEDLES) 31G X 5 MM MISC 1 each by Does not apply route 3 (three) times daily. May  dispense any manufacturer covered by patient's insurance.   Lancet Device MISC 1 each by Does not apply route 3 (three) times daily. May dispense any manufacturer covered by patient's insurance.   Lancets MISC 1 each by Does not apply route 3 (three) times daily. Use as directed to check blood sugar. May dispense any manufacturer covered by patient's insurance and fits patient's device.   mupirocin ointment (BACTROBAN) 2 % Apply 1 Application topically 2 (two) times daily.   pantoprazole (PROTONIX) 40 MG tablet Take 1 tablet (40 mg total) by mouth 2 (two) times daily for 14 days, THEN 1 tablet (40 mg total) daily.   No facility-administered encounter medications on file as of 08/22/2023.    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    Past Surgical History:  Procedure Laterality Date   ESOPHAGOGASTRODUODENOSCOPY (EGD) WITH PROPOFOL N/A 07/31/2023   Procedure: ESOPHAGOGASTRODUODENOSCOPY (EGD) WITH PROPOFOL;  Surgeon: Jenel Lucks, MD;  Location: [facility] ENDOSCOPY;  Service: Gastroenterology;  Laterality: N/A;   HEMOSTASIS CONTROL  07/31/2023   Procedure: HEMOSTASIS CONTROL;  Surgeon: Jenel Lucks, MD;  Location: [facility] ENDOSCOPY;  Service: Gastroenterology;;   IR ANGIOGRAM VISCERAL SELECTIVE  08/07/2023   IR ANGIOGRAM VISCERAL SELECTIVE  08/07/2023   IR EMBO VENOUS NOT HEMORR HEMANG  INC GUIDE ROADMAPPING  08/05/2023  IR RENAL SELECTIVE  UNI INC S&I MOD SED  08/07/2023   IR US GUIDE VASC ACCESS RIGHT  08/05/2023   RADIOLOGY WITH ANESTHESIA N/A 08/05/2023   Procedure: IR WITH ANESTHESIA -TIPS;  Surgeon: Radiologist, Medication, MD;  Location: [facility] OR;  Service: Radiology;  Laterality: N/A;    No family history on file.  Social History   Socioeconomic History   Marital status: Single    Spouse name: Not on file   Number of children: Not on file   Years  of education: Not on file   Highest education level: Not on file  Occupational History   Not on file  Tobacco Use   Smoking status: Every Day   Smokeless tobacco: Never  Substance and Sexual Activity   Alcohol use: Yes   Drug use: Never   Sexual activity: Not Currently  Other Topics Concern   Not on file  Social History Narrative   Not on file   Social Drivers of Health   Financial Resource Strain: Not on file  Food Insecurity: No Food Insecurity (07/30/2023)   Hunger Vital Sign    Worried About Running Out of Food in the Last Year: Never true    Ran Out of Food in the Last Year: Never true  Transportation Needs: No Transportation Needs (07/30/2023)   PRAPARE - Administrator, Civil Service (Medical): No    Lack of Transportation (Non-Medical): No  Physical Activity: Not on file  Stress: Not on file  Social Connections: Unknown (05/14/2023)   Received from [facility]   Social Network    Social Network: Not on file  Intimate Partner Violence: Not At Risk (07/30/2023)   Humiliation, Afraid, Rape, and Kick questionnaire    Fear of Current or Ex-Partner: No    Emotionally Abused: No    Physically Abused: No    Sexually Abused: No    Review of Systems  Constitutional:  Negative for fever.  Respiratory:  Negative for cough and shortness of breath.   Cardiovascular:  Negative for chest pain and leg swelling.  Gastrointestinal:  Negative for abdominal pain, nausea and vomiting.  Genitourinary:  Negative for dysuria.  Musculoskeletal:  Negative for falls.  Skin:        Erythematous area around nares/upper lip (present for last month).  Neurological:  Negative for dizziness and headaches.  Endo/Heme/Allergies:  Does not bruise/bleed easily.     Objective    BP (!) 144/74 (BP Location: Right Arm, Patient Position: Standing;Sitting, Cuff Size: Large)   Pulse 99   Temp 98.1 F (36.7 C) (Oral)   Ht 5\' 5"  (1.651 m)   Wt 189 lb 8 oz (86 kg)   SpO2 97%   BMI  31.53 kg/m   Physical Exam HENT:     Head: Normocephalic and atraumatic.     Nose:     Comments: Erythematous area around nares, rinorrhea present following patient crying, cracked skin Eyes:     Extraocular Movements: Extraocular movements intact.     Pupils: Pupils are equal, round, and reactive to light.  Cardiovascular:     Rate and Rhythm: Normal rate and regular rhythm.     Pulses: Normal pulses.  Pulmonary:     Effort: Pulmonary effort is normal.     Breath sounds: Normal breath sounds.  Abdominal:     Palpations: Abdomen is soft.     Tenderness: There is no abdominal tenderness.  Musculoskeletal:     Comments: ROM seems to be at  baseline, U/L bilateral strength 5/5 similar on R/L  Skin:    General: Skin is warm and dry.  Neurological:     Mental Status: She is alert. Mental status is at baseline.  Psychiatric:        Behavior: Behavior normal.     Comments: Patient became visibly upset (crying) talking about hospitalization experience    Last CBC Lab Results  Component Value Date   WBC 7.3 08/22/2023   HGB 10.4 (L) 08/22/2023   HCT 34.2 08/22/2023   MCV 75 (L) 08/22/2023   MCH 22.8 (L) 08/22/2023   RDW 26.0 (H) 08/22/2023   PLT 134 (L) 08/22/2023        Assessment & Plan:   Problem List Items Addressed This Visit     Type 2 diabetes mellitus (HCC) - Primary   Hgb A1c 11.4% on admission, titrated up to 30 units of long-acting insulin during admission. Hgb A1c today 6.9%, CBG 148, microalbumin/cr ratio WNL. Endorses consistent use of 30 units basaglar. Stated concern regarding medication costs (paid >$600 dollars for medication refills). Discussed need to call clinic should that happen in the future.  Plan - Continue basaglar 30 units daily - 4 week IMC follow up - Sent message to [facility] regarding medication costs      Relevant Orders   POC Hbg A1C (Completed)   Microalbumin / Creatinine Urine Ratio (Completed)   Decompensated cirrhosis  (HCC)   Patient had 2 episodes of large bloody bowel movements with bright red blood and clots during hospitalization. Became hypotensive, admitted to ICU, received 2 units of PRBC and was started on empiric broad-spectrum antibiotics, octreotide, IV PPI. 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 IR was consulted for balloon-occluded retrograde transvenous obliteration, completed on 08/05/23. Started on IV heparin for VTE after seeing non-occlusive superior mesenteric vein mural thrombus on post-procedure CT. Decompensated cirrhosis likely secondary to [facility]. Patient denies any alcohol use. Transitioned to PO anticoagulation. Plan for follow up with IR outpatient 4 weeks after discharge and GI 6 weeks after discharge for repeat EGD.   Denies any overt bleeding including hemoptysis or hematochezia. No alcohol use. Does not have appointments set up with IR or GI. Has been taking pantoprazole 40 mg daily (doesn't think she's been taking it twice a day since discharge but not sure), and carvedilol 3.125 mg BID. Plan - Continue carvedilol 3.125 mg BID  - Continue pantoprazole 40 mg BID (until 1/19), then 40 mg daily for 30 more days - Patient to call IR and GI to set up outpatient follow up appointments      Cerebrovascular accident (CVA) (HCC)   CT head showed a cortical hypodensity in the right parieto-occipital junction concerning for an acute or subacute infarct. MRI brain demonstrated acute to early subacute infarcts with scattered distribution suggestive of embolic etiology. Stroke workup, including TTE to rule out cardiac thrombus, was limited by variceal bleeding. Patient was discharged in fair condition on Eliquis 5 mg BID, an ambulatory cardiac monitor with outpatient cardiology follow up, 4 week follow up with [facility], and [facility] PT and OT.  Patient endorses taking Eliquis  consistently, denies any falls, bruising, or overt bleeding. Does not have any scheduled follow up with cardiology or neurology stroke clinic. Discussed need to call respective offices and confirm appointments. Patient states she is doing well with home health PT and OT, currently  ambulating with the aid of a cane. Fairly independent with most ADLs. Lives with husband and one of her sons, and both are able to help with IADLs.  Plan - Continue Eliquis 5 mg BID - Patient to contact cardiology and neurology for follow up appointments      Relevant Orders   CBC no Diff (Completed)   Iron deficiency anemia   Hgb 9.6 at time of discharge, Hgb today 10.4. Patient endorses feeling fatigued/tired since discharge and slowly getting energy back, denies weakness, confusion, or syncope. Iron panel shows concern for iron deficiency anemia. No overt bleeding on exam today. Plan - START PO ferrous sulfate 325 mg daily, repeat iron panel in 3 months        Relevant Orders   Iron, TIBC and Ferritin Panel (Completed)   Patient discussed with Dr. Antony Contras.   Return in about 4 weeks (around 09/19/2023) for Medication management, referral follow up, health maintenance.   Michayla Mcneil Colbert Coyer, MD

## 2023-08-22 NOTE — Patient Instructions (Signed)
 Thank you, [name] for allowing us  to provide your care today. Today we discussed your recent hospitalization and medication management.   I have ordered the following labs for you:  Lab Orders         Microalbumin / Creatinine Urine Ratio         Glucose, capillary         CBC no Diff         Iron, TIBC and Ferritin Panel         POC Hbg A1C      Tests ordered today:  - None  Referrals ordered today:   Referral Orders  No referral(s) requested today     I have ordered the following medication/changed the following medications:   Stop the following medications: There are no discontinued medications.   Start the following medications: No orders of the defined types were placed in this encounter.    Follow up:  1 month      Remember:   - Please be sure to follow up on the 30 day ambulatory cardiac monitor.  - Please call the numbers below to confirm appointments with Neurology, Radiology, and Gastroenterology. - I will call you with the results of your labs today.  - Please make an appointment for 1 month so we can discuss medications, follow up appointments, and health maintenance.  - Our pharmacy tech, Maria Rose, will call you to discuss medication costs and preferred pharmacy.  - Please continue to take your medications as instructed.  - Please follow driving precautions provided to you at discharge and abstain from driving until you can discuss with your neurologist.    [facility]OLOG[facility] Follow up in 3 month(s).   Specialty: Radiology Why: Patient to be scheduled for IR folow up and CT BRTO protocol in 3 months. IR scheduler will call with appoinment date and time. Please call [phone] or after hours number [phone] with any questions or concerns. Contact information: [address] [place]  [address] [phone]            [facility] Gastroenterology Follow up.    Specialty: Gastroenterology Why: Esophagogastroduedonoscopy in 6 weeks time from discharge Contact information: [address] [place]  [address] [phone]                  [facility] [phone] [address] [place], [place]  [address] Main: [phone]  [facility] Follow up.   Specialty: Rehabilitation Why: Physical, Occupational, Speech therapy. Office will call to arrange follow up after hospital discharge. Contact information: [address] [facility] [place]  [address] [phone]   Should you have any questions or concerns please call the internal medicine clinic at [phone].     Regnald Bowens Arellano Zameza, MD PGY-1 Internal Medicine Teaching Progam [facility] Center

## 2023-08-23 ENCOUNTER — Other Ambulatory Visit: Payer: Self-pay | Admitting: Student

## 2023-08-23 LAB — CBC
Hematocrit: 34.2 % (ref 34.0–46.6)
Hemoglobin: 10.4 g/dL — ABNORMAL LOW (ref 11.1–15.9)
MCH: 22.8 pg — ABNORMAL LOW (ref 26.6–33.0)
MCHC: 30.4 g/dL — ABNORMAL LOW (ref 31.5–35.7)
MCV: 75 fL — ABNORMAL LOW (ref 79–97)
Platelets: 134 10*3/uL — ABNORMAL LOW (ref 150–450)
RBC: 4.57 x10E6/uL (ref 3.77–5.28)
RDW: 26 % — ABNORMAL HIGH (ref 11.7–15.4)
WBC: 7.3 10*3/uL (ref 3.4–10.8)

## 2023-08-23 LAB — IRON,TIBC AND FERRITIN PANEL
Ferritin: 19 ng/mL (ref 15–150)
Iron Saturation: 5 % — CL (ref 15–55)
Iron: 21 ug/dL — ABNORMAL LOW (ref 27–139)
Total Iron Binding Capacity: 447 ug/dL (ref 250–450)
UIBC: 426 ug/dL — ABNORMAL HIGH (ref 118–369)

## 2023-08-23 LAB — MICROALBUMIN / CREATININE URINE RATIO
Creatinine, Urine: 49.9 mg/dL
Microalb/Creat Ratio: <6 mg/g{creat} (ref 0–29)
Microalbumin, Urine: <3 ug/mL

## 2023-08-23 MED ORDER — FERROUS SULFATE 325 (65 FE) MG PO TABS: 325.0000 mg | ORAL_TABLET | Freq: Every day | ORAL | 2 refills | Status: DC

## 2023-08-23 NOTE — Therapy (Signed)
OUTPATIENT OCCUPATIONAL THERAPY NEURO EVALUATION  Patient Name: Maria Rose MRN: [id number] DOB:06-24-1956, 68 y.o., female Today's Date: 08/24/2023  PCP: none REFERRING PROVIDER: Mercie Eon, MD  END OF SESSION:  OT End of Session - 08/24/23 0932     Visit Number 3    Number of Visits 25    Date for OT Re-Evaluation 11/07/23    Authorization Type [employer]    Progress Note Due on Visit 10    OT Start Time [phone]    OT Stop Time 1012    OT Time Calculation (min) 41 min    Activity Tolerance Patient tolerated treatment well    Behavior During Therapy [facility] for tasks assessed/performed               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   Past Surgical History:  Procedure Laterality Date   ESOPHAGOGASTRODUODENOSCOPY (EGD) WITH PROPOFOL N/A 07/31/2023   Procedure: ESOPHAGOGASTRODUODENOSCOPY (EGD) WITH PROPOFOL;  Surgeon: Jenel Lucks, MD;  Location: [facility] ENDOSCOPY;  Service: Gastroenterology;  Laterality: N/A;   HEMOSTASIS CONTROL  07/31/2023   Procedure: HEMOSTASIS CONTROL;  Surgeon: Jenel Lucks, MD;  Location: [facility] ENDOSCOPY;  Service: Gastroenterology;;   IR ANGIOGRAM VISCERAL SELECTIVE  08/07/2023   IR ANGIOGRAM VISCERAL SELECTIVE  08/07/2023   IR EMBO VENOUS NOT HEMORR HEMANG  INC GUIDE ROADMAPPING  08/05/2023   IR RENAL SELECTIVE  UNI INC S&I MOD SED  08/07/2023   IR US GUIDE VASC ACCESS RIGHT  08/05/2023   RADIOLOGY WITH ANESTHESIA N/A 08/05/2023   Procedure: IR WITH ANESTHESIA -TIPS;  Surgeon: Radiologist, Medication, MD;  Location: [facility] OR;  Service: Radiology;  Laterality: N/A;   Patient Active Problem List   Diagnosis Date Noted   Hepatic cirrhosis (HCC) 08/14/2023   Other cirrhosis of liver (HCC) 08/11/2023   Iron deficiency anemia 08/11/2023   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   Pt accompanied by: self  PERTINENT HISTORY: Per chart, 68 y.o. female presented to [facility] hospital on 07/30/2023 after a fall and shaking episode. 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 Additional punctate acute infarcts in right cerebellum, left temporal lobe, left occipital lobe, right occipital lobe, right parietal lobe, right frontal lobe, right thalamus, left frontal lobe. Pt with bloody bowel movement and hematemesis on 12/24.  While hospitalized  she suffered a brisk upper GI bleed associated with profound hypotension and hemoglobin drop from 10 to 5.  She underwent an upper endoscopy which showed grade 1 varices and gastric varices with bleeding stigmata, attributed as the bleeding source.  She underwent BRTO on December 29 and a subsequent CT angiogram showed successful occlusion of the gastric varices with splenorenal shunt, but did show an acute nonocclusive SMV thrombus.  She was started  on IV heparin and transitioned to oral anticoagulation. Prior to this admission, the patient had no idea she had liver disease or cirrhosis.  PMH includes hepatic cirrhosis, gastric and splenic varices, sepsis. Pt d/c home 08/12/23   PRECAUTIONS: Fall  WEIGHT BEARING RESTRICTIONS: No  PAIN:  Are you having pain? No  FALLS: Has patient fallen in last 6  months? No  LIVING ENVIRONMENT: Lives with: lives with their family Lives in: House/apartment Stairs: Yes: Internal: 15 steps; on right going up and on left going up Has following equipment at home: Dan Humphreys - 2 wheeled  PLOF: Independent  PATIENT GOALS: maintian I   OBJECTIVE:  Note: Objective measures were completed at Evaluation unless otherwise noted.  HAND DOMINANCE: Left  ADLs: Overall ADLs: supervision-mod I with all basic ADLs. Transfers/ambulation related to ADLs:3 Eating: mod I Grooming: mod I UB Dressing: mod I LB Dressing: mod I Toileting: supervision Bathing: sponge bath Tub Shower transfers: not performing   IADLs:  Meal Prep: supervision  for simple breakfast prep, has not attempted previosly level of cooking Community mobility: supervision Medication management: keeps up with meds , has pillbox but has not set up Financial management: has some auto bill pay set up Handwriting: 100% legible  MOBILITY STATUS: supervision  POSTURE COMMENTS:    ACTIVITY TOLERANCE: Activity tolerance: impaired per pt report   UPPER EXTREMITY ROM:  WFLS    UPPER EXTREMITY MMT:     MMT Right eval Left eval  Shoulder flexion 4+/5 4-/5  Shoulder abduction    Shoulder adduction    Shoulder extension    Shoulder internal rotation    Shoulder external rotation    Middle trapezius    Lower trapezius    Elbow flexion 4+/5 4+/5  Elbow extension 4+/5 4/5  Wrist flexion    Wrist extension    Wrist ulnar deviation    Wrist radial deviation    Wrist pronation    Wrist supination    (Blank rows = not tested)  HAND FUNCTION: Grip strength: Right: 30 lbs; Left: 45 lbs  COORDINATION: 9 Hole Peg test: Right: 31.39 sec; Left: 34.09 sec  SENSATION: WFL   COGNITION: Overall cognitive status: Impaired-  impaired short term meory, 2/3 words recalled after short delay, spells WORLD backwards without difficulty. Clock drawing task: pt wrote numbers backwards on  clock face with 11, 10 , 9 on right side of clock.  Per speech therapy pt completed medication management task correctly.   VISION: Subjective report: Pt reports something is off Baseline vision:  usually wears contacts   VISION ASSESSMENT: Gaze preference/alignment: gaze right Tracking/Visual pursuits: Decreased smoothness of eye movement to Left  visual field Saccades: impaired Visual Fields: Left visual field deficits   PERCEPTION: Impaired: Inattention/neglect: does not attend to left visual field, number cancellation- 2/80 missed on left margin- discorganized scan pattern    OBSERVATIONS: Pleasant female who appears overwhelmed by current situation. She had not seen MD in many years prior to CVA  TREATMENT DATE:   08/24/23:   Copying PVC design with min difficulty x2, but able to problem solve, perform visual scanning, and self correct without cueing with first design, but needed min cueing for 2nd design.  "Organizing Your Day"  #1 for complex problem solving/planning with incr time, min-mod cueing for organization (write to do list first as strategy to decr risk of omissions and check off), omissions (2), and problem solving.      08/20/23-  Environemntal scanning in a moderately busy environment 5/14 (64%)items missed on first pass. Min questioning cues to locate remainder of items, 3/5 items were on left side  Copying small peg design with LUE for fine motor coordination with a visual/ cognitve component, pt completed correctly without v.c Word search to encourage visual scanning in organized fashion, 17 words located in 8 mins, min v.c for line guide  08/15/23 eval       PATIENT EDUCATION:   Education details:  see above for cueing/strategies  Person educated: Patient Education method: Explanation Education comprehension: verbalized  understanding  HOME EXERCISE PROGRAM: not issued yet   GOALS: Goals reviewed with patient? Yes  SHORT TERM GOALS: Target date: 09/14/23  I with inital HEP including LUE coordination  Goal status: INITIAL  2.  Pt will verbalize understanding of compensatory strategies for visual deficits.  Goal status: INITIAL  3.  Pt will perform tabletop scanning activities with a cognitve component with 95% or better accuracy.  Goal status: INITIAL  4.  Pt will perform basic environmental scanning with 90% or better accuracy.  Goal status: INITIAL  5.  Pt will perfrom all basic ADLs mod I  Goal status: INITIAL 6. Pt will verbalize understanding of compensatory strategies for cognitve deficits. Goal status: new    LONG TERM GOALS: Target date: 11/07/23  I with updated HEP proximal strength  Goal status: INITIAL  2.  Pt will perform a physical and cogntive task simultaneously with 90% or better accuracy.  Goal status: INITIAL  3.  Pt will perform mod complex environmental scanning( with a cognitve component) with 90% or better accuracy.  Goal status: INITIAL  4.  Pt will perform a functional organization/ planning  task with 90% or better accuracy.( generating a grocery list/ meal planning etc)  Goal status: INITIAL  5.  Pt will resume cooking and performing home management activities mod I   Goal status: INITIAL   ASSESSMENT:  CLINICAL IMPRESSION: Patient is a progressing towards goals with improving visual scanning and cognition.  PERFORMANCE DEFICITS: in functional skills including ADLs, IADLs, coordination, dexterity, strength, Fine motor control, mobility, balance, decreased knowledge of precautions, decreased knowledge of use of DME, vision, and UE functional use, cognitive skills including attention, memory, perception, problem solving, and safety awareness, and psychosocial skills including coping strategies, environmental adaptation, habits, interpersonal  interactions, and routines and behaviors.   IMPAIRMENTS: are limiting patient from ADLs, IADLs, play, leisure, and social participation.   CO-MORBIDITIES: may have co-morbidities  that affects occupational performance. Patient will benefit from skilled OT to address above impairments and improve overall function.  MODIFICATION OR ASSISTANCE TO COMPLETE EVALUATION: No modification of tasks or assist necessary to complete an evaluation.  OT OCCUPATIONAL PROFILE AND HISTORY: Detailed assessment: Review of records and additional review of physical, cognitive, psychosocial history related to current functional performance.  CLINICAL DECISION MAKING: LOW - limited treatment options, no task modification necessary  REHAB POTENTIAL: Good  EVALUATION COMPLEXITY: Low    PLAN:  OT FREQUENCY: 2x/week  OT DURATION: 12 weeks- may d/c sooner based upon pt progress.  PLANNED INTERVENTIONS: 97168 OT Re-evaluation, 97535 self care/ADL training, 16109 therapeutic exercise, 97530 therapeutic activity, 97112 neuromuscular re-education, 97140 manual therapy, 97035 ultrasound, 97018 paraffin, 60454 moist heat, 97010 cryotherapy, 97034 contrast bath, 97129 Cognitive training (first 15 min), 09811 Cognitive training(each additional 15 min), passive range of motion, balance training, functional mobility training, visual/perceptual remediation/compensation, energy conservation, coping strategies training, patient/family education, and DME and/or AE instructions  RECOMMENDED OTHER SERVICES: PT  CONSULTED AND AGREED WITH PLAN OF CARE: Patient  PLAN FOR NEXT SESSION   environmental scanning, functional cognition with a visual component    Tayden Nichelson, OTR/L 08/24/2023, 9:43 AM

## 2023-08-24 ENCOUNTER — Encounter: Payer: Self-pay | Admitting: Occupational Therapy

## 2023-08-24 ENCOUNTER — Ambulatory Visit: Payer: Commercial Managed Care - PPO

## 2023-08-24 ENCOUNTER — Ambulatory Visit: Payer: Commercial Managed Care - PPO | Admitting: Occupational Therapy

## 2023-08-24 DIAGNOSIS — R2689 Other abnormalities of gait and mobility: Secondary | ICD-10-CM

## 2023-08-24 DIAGNOSIS — R278 Other lack of coordination: Secondary | ICD-10-CM

## 2023-08-24 DIAGNOSIS — R41840 Attention and concentration deficit: Secondary | ICD-10-CM

## 2023-08-24 DIAGNOSIS — R262 Difficulty in walking, not elsewhere classified: Secondary | ICD-10-CM

## 2023-08-24 DIAGNOSIS — M6281 Muscle weakness (generalized): Secondary | ICD-10-CM

## 2023-08-24 DIAGNOSIS — R41844 Frontal lobe and executive function deficit: Secondary | ICD-10-CM

## 2023-08-24 DIAGNOSIS — R2681 Unsteadiness on feet: Secondary | ICD-10-CM

## 2023-08-24 DIAGNOSIS — R41842 Visuospatial deficit: Secondary | ICD-10-CM

## 2023-08-24 NOTE — Therapy (Signed)
OUTPATIENT PHYSICAL THERAPY NEURO EVALUATION   Patient Name: KEYAIRAH ROMANS MRN: [id number] DOB:1956/06/17, 68 y.o., female Today's Date: 08/15/2023  PCP: N/A REFERRING PROVIDER: Mercie Eon, MD  END OF SESSION:  PT End of Session - 08/15/23 1503     Visit Number 1    Date for PT Re-Evaluation 10/10/23    PT Start Time 0930    PT Stop Time 1010    PT Time Calculation (min) 40 min    Activity Tolerance Patient tolerated treatment well    Behavior During Therapy [facility] for tasks assessed/performed            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   Past Surgical History:  Procedure Laterality Date   ESOPHAGOGASTRODUODENOSCOPY (EGD) WITH PROPOFOL N/A 07/31/2023   Procedure: ESOPHAGOGASTRODUODENOSCOPY (EGD) WITH PROPOFOL;  Surgeon: Jenel Lucks, MD;  Location: [facility] ENDOSCOPY;  Service: Gastroenterology;  Laterality: N/A;   HEMOSTASIS CONTROL  07/31/2023   Procedure: HEMOSTASIS CONTROL;  Surgeon: Jenel Lucks, MD;  Location: [facility] ENDOSCOPY;  Service: Gastroenterology;;   IR ANGIOGRAM VISCERAL SELECTIVE  08/07/2023   IR ANGIOGRAM VISCERAL SELECTIVE  08/07/2023   IR EMBO VENOUS NOT HEMORR HEMANG  INC GUIDE ROADMAPPING  08/05/2023   IR RENAL SELECTIVE  UNI INC S&I MOD SED  08/07/2023   IR US GUIDE VASC ACCESS RIGHT  08/05/2023   RADIOLOGY WITH ANESTHESIA N/A 08/05/2023   Procedure: IR WITH ANESTHESIA -TIPS;  Surgeon: Radiologist, Medication, MD;  Location: [facility] OR;  Service: Radiology;  Laterality: N/A;   Patient Active Problem List   Diagnosis Date Noted   Hepatic cirrhosis (HCC) 08/14/2023   Other cirrhosis of liver (HCC) 08/11/2023   Iron deficiency anemia 08/11/2023   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   Decompensated cirrhosis (HCC) 07/31/2023   Bleeding gastric varices 07/31/2023   Cerebrovascular accident (CVA) (HCC)  07/31/2023   Type 2 diabetes mellitus (HCC) 07/30/2023    ONSET DATE: 08/12/23  REFERRING DIAG: I63.9 (ICD-10-CM) - Stroke (HCC)   THERAPY DIAG:  Difficulty in walking, not elsewhere classified - Plan: PT plan of care cert/re-cert  Muscle weakness (generalized) - Plan: PT plan of care cert/re-cert  Other abnormalities of gait and mobility - Plan: PT plan of care cert/re-cert  Other lack of coordination - Plan: PT plan of care cert/re-cert  Unsteadiness on feet - Plan: PT plan of care cert/re-cert  Cerebrovascular accident (CVA), unspecified mechanism (HCC) - Plan: PT plan of care cert/re-cert  Rationale for Evaluation and Treatment: Rehabilitation  SUBJECTIVE:  SUBJECTIVE STATEMENT: Pt is doing ok.   PERTINENT HISTORY:  Per hospital PT evaluation note: 68 y.o. female presents to [facility] hospital on 07/30/2023 after a fall and shaking episode. 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 Additional punctate acute infarcts in right cerebellum, left temporal lobe, left occipital lobe, right occipital lobe, right parietal lobe, right frontal lobe, right thalamus, left frontal lobe. Pt with bloody bowel movement and hematemesis on 12/24. PMH includes hepatic cirrhosis, gastric and splenic varices, sepsis.   PAIN:  Are you having pain? Yes: NPRS scale: 8/10 Pain location: B knees and bottoms of her feet. Pain description: Chronic Aggravating factors: Cold weather, too much walking. Relieving factors: Has knee brace which helps, was taking Allieve, but had to stop since stroke.  PRECAUTIONS: Fall and Other: L visual field cut  RED FLAGS: None   WEIGHT BEARING RESTRICTIONS: No  FALLS: Has patient  fallen in last 6 months? Yes. Number of falls 1  LIVING ENVIRONMENT: Lives with: lives with their family Lives in: House/apartment Stairs: Yes: Internal: 18 steps; on left going up and External: 1 steps; none Has following equipment at home: Walker - 2 wheeled  PLOF: Independent  PATIENT GOALS: Patient would like to improve her balance, minimize knee pain, return her normal daily activities.  OBJECTIVE:  Note: Objective measures were completed at Evaluation unless otherwise noted.  DIAGNOSTIC FINDINGS: N/A  COGNITION: Overall cognitive status: Within functional limits for tasks assessed   SENSATION: Patient denies change  COORDINATION: Heel slide on shin, WNL  EDEMA:  Patient denies  MUSCLE TONE: WNL  MUSCLE LENGTH: Hamstrings: Right 75 deg; Left 60 deg Thomas test: WNL B.  POSTURE: increased thoracic kyphosis and weight shift right  LOWER EXTREMITY ROM:   WNL B.  LOWER EXTREMITY MMT:  R 5/5  MMT Right Eval Left Eval  Hip flexion  4-  Hip extension  4-  Hip abduction  4-  Hip adduction    Hip internal rotation    Hip external rotation    Knee flexion  4  Knee extension  4  Ankle dorsiflexion  4  Ankle plantarflexion  4-  Ankle inversion    Ankle eversion    (Blank rows = not tested)  BED MOBILITY:  I  TRANSFERS: I  RAMP:  S  CURB:  CGA  STAIRS: Level of Assistance: SBA Stair Negotiation Technique: Step to Pattern with Single Rail on Left Number of Stairs: 18  Height of Stairs: 6"  Comments: Patient reports she is stable with a rail  GAIT: Gait pattern: step through pattern, decreased arm swing- Right, decreased arm swing- Left, shuffling, and lateral hip instability Distance walked: in clinic distances Assistive device utilized: None Level of assistance: SBA Comments: Patient reports that she walks with her son or husband when outside of her house for safety.  FUNCTIONAL TESTS:  5 times sit to stand: 18.81 Berg Balance Scale:  39 Functional gait assessment: 16  TREATMENT DATE:   08/24/23 - Seated Hip Abduction with Resistance  - 1 x daily - 7 x weekly - 1-2 sets - 10 reps - 3 seconds hold (yellow band) - Sit to Stand  - 1 x daily - 7 x weekly - 1-2 sets - 5-10 reps (holding 2lb ball at chest) - Side Stepping with Counter Support  - 1 x daily - 7 x weekly - 2-3 sets - 10 reps (PT in front of patient without UE support) - Heel Raises with Counter Support  - 1 x daily - 7 x weekly - 1-2 sets - 10 reps - Standing March with Counter Support  - 1 x daily - 7 x weekly - 1-2 sets - 10 reps - Standing Hip Abduction with Counter Support  - 1 x daily - 7 x weekly - 1 sets - 10 reps (mirror feedback at ) - PPT x12 - Bridge 2x10 - lumbar rotations x10  08/20/23 Nustep level 5 x 6 minutes Leg curls 20# 2x10 LAQ 2x10 2.5# - pulls on left knee Step on and off airex In pbars step turn touch In front of bench feet stationary turn and touch cone to side and behind Then did the same on airex, had a loss of balance and went back onto bench sitting On airex catch and volleyball Turn and toss with feet stationary Walking ball toss  08/15/23 Education    PATIENT EDUCATION: Education details: POC Person educated: Patient Education method: Explanation Education comprehension: verbalized understanding  HOME EXERCISE PROGRAM: Access Code: [email or web address] URL: [email or web address] Date: 08/24/2023 Prepared by: Gardiner Rhyme  Exercises - Seated Hip Abduction with Resistance  - 1 x daily - 7 x weekly - 1-2 sets - 10 reps - 3 seconds hold - Sit to Stand  - 1 x daily - 7 x weekly - 1-2 sets - 5-10 reps - Side Stepping with Counter Support  - 1 x daily - 7 x weekly - 2-3 sets - 10 reps - Heel Raises with Counter Support  - 1 x daily - 7 x weekly - 1-2 sets - 10 reps - Standing March with Counter  Support  - 1 x daily - 7 x weekly - 1-2 sets - 10 reps - Standing Hip Abduction with Counter Support  - 1 x daily - 7 x weekly - 1 sets - 10 reps  GOALS: Goals reviewed with patient? Yes  SHORT TERM GOALS: Target date: 09/04/23  I with initial HEP Baseline: Goal status: INITIAL  LONG TERM GOALS: Target date: 10/10/23  I with final HEP Baseline:  Goal status: INITIAL  2.  Complete 5 x STS without UE support in < 15 sec Baseline: 18.8, used UE Goal status: INITIAL  3.  Improve BERG score to at least 45/56 to indicate minimal fall risk Baseline: 39 Goal status: INITIAL  4.  Improve FGA score to at least 24/28 Baseline: 16 Goal status: INITIAL  5.  Increase L LE strength and trunk strength to at least 4+/5 to provide better support and help decrease knee pain. Baseline:  Goal status: INITIAL  6. Patient will walk at least 500' on level and unlevel surfaces I with no instability noted.  Baseline: Multiple gait abnormalities and limitations noted.  Goal Status: INITIAL  ASSESSMENT:  CLINICAL IMPRESSION: Pt presents with continued 8/10 knee pain L>R and LLE weakness. She tolerated tx well with no adverse reactions or overt LOB. Initiated HEP today and reviewed all exercises with focus on strength and balance.  Pt demo LLE weakness in SLS and dynamic activity, with mirror feedback at  bars to facilitate lateral hip stability. She will continue to benefit from further skilled PT to maximize balance and return to PLOF.  Patient is a 68 y.o. who was seen today for physical therapy evaluation and treatment S/P R CVA. She presents with mild L sided weakness, decreased balance, decreased safety and I with all functional mobility. She also has a L visual field cut, and chronic B knee pain. She will benefit from PT to address her deficits in order to to improve her safety and I with all mobility, return to her normal daily activities and independence.  OBJECTIVE IMPAIRMENTS: decreased  activity tolerance, decreased balance, decreased coordination, decreased endurance, decreased mobility, difficulty walking, decreased strength, and postural dysfunction.   ACTIVITY LIMITATIONS: standing, squatting, stairs, and locomotion level  PARTICIPATION LIMITATIONS: meal prep, cleaning, laundry, driving, shopping, and community activity  PERSONAL FACTORS: Past/current experiences are also affecting patient's functional outcome.   REHAB POTENTIAL: Good  CLINICAL DECISION MAKING: Stable/uncomplicated  EVALUATION COMPLEXITY: Moderate  PLAN:  PT FREQUENCY: 2x/week  PT DURATION: 10 weeks  PLANNED INTERVENTIONS: 97110-Therapeutic exercises, 97530- Therapeutic activity, 97112- Neuromuscular re-education, 97535- Self Care, 95188- Manual therapy, [phone]- Gait training, [phone]- Electrical stimulation (unattended), Patient/Family education, Balance training, Stair training, and Joint mobilization  PLAN FOR NEXT SESSION: Review HEP/update PRN, progress strength and balance, NMR   Bettey Mare. Corliss Marcus, PT, DPT

## 2023-08-25 NOTE — Assessment & Plan Note (Signed)
Hgb A1c 11.4% on admission, titrated up to 30 units of long-acting insulin during admission. Hgb A1c today 6.9%, CBG 148, microalbumin/cr ratio WNL. Endorses consistent use of 30 units basaglar. Stated concern regarding medication costs (paid >$600 dollars for medication refills). Discussed need to call clinic should that happen in the future.  Plan - Continue basaglar 30 units daily - 4 week [facility] follow up - Sent message to [facility] regarding medication costs

## 2023-08-25 NOTE — Assessment & Plan Note (Signed)
Hgb 9.6 at time of discharge, Hgb today 10.4. Patient endorses feeling fatigued/tired since discharge and slowly getting energy back, denies weakness, confusion, or syncope. Iron panel shows concern for iron deficiency anemia. No overt bleeding on exam today. Plan - START PO ferrous sulfate 325 mg daily, repeat iron panel in 3 months

## 2023-08-25 NOTE — Assessment & Plan Note (Addendum)
Patient had 2 episodes of large bloody bowel movements with bright red blood and clots during hospitalization. Became hypotensive, admitted to ICU, received 2 units of PRBC and was started on empiric broad-spectrum antibiotics, octreotide, IV PPI. 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 IR was consulted for balloon-occluded retrograde transvenous obliteration, completed on 08/05/23. Started on IV heparin for VTE after seeing non-occlusive superior mesenteric vein mural thrombus on post-procedure CT. Decompensated cirrhosis likely secondary to [facility]. Patient denies any alcohol use. Transitioned to PO anticoagulation. Plan for follow up with IR outpatient 4 weeks after discharge and GI 6 weeks after discharge for repeat EGD.   Denies any overt bleeding including hemoptysis or hematochezia. No alcohol use. Does not have appointments set up with IR or GI. Has been taking pantoprazole 40 mg daily (doesn't think she's been taking it twice a day since discharge but not sure), and carvedilol 3.125 mg BID. Plan - Continue carvedilol 3.125 mg BID  - Continue pantoprazole 40 mg BID (until 1/19), then 40 mg daily for 30 more days - Patient to call IR and GI to set up outpatient follow up appointments

## 2023-08-25 NOTE — Assessment & Plan Note (Addendum)
CT head showed a cortical hypodensity in the right parieto-occipital junction concerning for an acute or subacute infarct. MRI brain demonstrated acute to early subacute infarcts with scattered distribution suggestive of embolic etiology. Stroke workup, including TTE to rule out cardiac thrombus, was limited by variceal bleeding. Patient was discharged in fair condition on Eliquis 5 mg BID, an ambulatory cardiac monitor with outpatient cardiology follow up, 4 week follow up with [facility], and [facility] PT and OT.  Patient endorses taking Eliquis consistently, denies any falls, bruising, or overt bleeding. Does not have any scheduled follow up with cardiology or neurology stroke clinic. Discussed need to call respective offices and confirm appointments. Patient states she is doing well with home health PT and OT, currently ambulating with the aid of a cane. Fairly independent with most ADLs. Lives with husband and one of her sons, and both are able to help with IADLs.  Plan - Continue Eliquis 5 mg BID - Patient to contact cardiology and neurology for follow up appointments

## 2023-08-28 ENCOUNTER — Ambulatory Visit: Payer: Commercial Managed Care - PPO | Admitting: Physical Therapy

## 2023-08-28 ENCOUNTER — Encounter: Payer: Self-pay | Admitting: Physical Therapy

## 2023-08-28 DIAGNOSIS — R278 Other lack of coordination: Secondary | ICD-10-CM

## 2023-08-28 DIAGNOSIS — M6281 Muscle weakness (generalized): Secondary | ICD-10-CM

## 2023-08-28 DIAGNOSIS — R2681 Unsteadiness on feet: Secondary | ICD-10-CM

## 2023-08-28 DIAGNOSIS — R2689 Other abnormalities of gait and mobility: Secondary | ICD-10-CM

## 2023-08-28 DIAGNOSIS — I639 Cerebral infarction, unspecified: Secondary | ICD-10-CM

## 2023-08-28 DIAGNOSIS — R262 Difficulty in walking, not elsewhere classified: Secondary | ICD-10-CM

## 2023-08-28 NOTE — Therapy (Signed)
OUTPATIENT PHYSICAL THERAPY NEURO EVALUATION   Patient Name: Maria Rose MRN: [id number] DOB:January 21, 1956, 68 y.o., female Today's Date: 08/15/2023  PCP: N/A REFERRING PROVIDER: Mercie Eon, MD  END OF SESSION:  PT End of Session - 08/15/23 1503     Visit Number 1    Date for PT Re-Evaluation 10/10/23    PT Start Time 0930    PT Stop Time 1010    PT Time Calculation (min) 40 min    Activity Tolerance Patient tolerated treatment well    Behavior During Therapy [facility] for tasks assessed/performed            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   Past Surgical History:  Procedure Laterality Date   ESOPHAGOGASTRODUODENOSCOPY (EGD) WITH PROPOFOL N/A 07/31/2023   Procedure: ESOPHAGOGASTRODUODENOSCOPY (EGD) WITH PROPOFOL;  Surgeon: Jenel Lucks, MD;  Location: [facility] ENDOSCOPY;  Service: Gastroenterology;  Laterality: N/A;   HEMOSTASIS CONTROL  07/31/2023   Procedure: HEMOSTASIS CONTROL;  Surgeon: Jenel Lucks, MD;  Location: [facility] ENDOSCOPY;  Service: Gastroenterology;;   IR ANGIOGRAM VISCERAL SELECTIVE  08/07/2023   IR ANGIOGRAM VISCERAL SELECTIVE  08/07/2023   IR EMBO VENOUS NOT HEMORR HEMANG  INC GUIDE ROADMAPPING  08/05/2023   IR RENAL SELECTIVE  UNI INC S&I MOD SED  08/07/2023   IR US GUIDE VASC ACCESS RIGHT  08/05/2023   RADIOLOGY WITH ANESTHESIA N/A 08/05/2023   Procedure: IR WITH ANESTHESIA -TIPS;  Surgeon: Radiologist, Medication, MD;  Location: [facility] OR;  Service: Radiology;  Laterality: N/A;   Patient Active Problem List   Diagnosis Date Noted   Hepatic cirrhosis (HCC) 08/14/2023   Other cirrhosis of liver (HCC) 08/11/2023   Iron deficiency anemia 08/11/2023   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   Decompensated cirrhosis (HCC) 07/31/2023   Bleeding gastric varices 07/31/2023   Cerebrovascular accident (CVA) (HCC)  07/31/2023   Type 2 diabetes mellitus (HCC) 07/30/2023    ONSET DATE: 08/12/23  REFERRING DIAG: I63.9 (ICD-10-CM) - Stroke (HCC)   THERAPY DIAG:  Difficulty in walking, not elsewhere classified - Plan: PT plan of care cert/re-cert  Muscle weakness (generalized) - Plan: PT plan of care cert/re-cert  Other abnormalities of gait and mobility - Plan: PT plan of care cert/re-cert  Other lack of coordination - Plan: PT plan of care cert/re-cert  Unsteadiness on feet - Plan: PT plan of care cert/re-cert  Cerebrovascular accident (CVA), unspecified mechanism (HCC) - Plan: PT plan of care cert/re-cert  Rationale for Evaluation and Treatment: Rehabilitation  SUBJECTIVE:  SUBJECTIVE STATEMENT: Pt reports no new issues.  PERTINENT HISTORY:  Per hospital PT evaluation note: 68 y.o. female presents to [facility] hospital on 07/30/2023 after a fall and shaking episode. 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 Additional punctate acute infarcts in right cerebellum, left temporal lobe, left occipital lobe, right occipital lobe, right parietal lobe, right frontal lobe, right thalamus, left frontal lobe. Pt with bloody bowel movement and hematemesis on 12/24. PMH includes hepatic cirrhosis, gastric and splenic varices, sepsis.   PAIN:  Are you having pain? Yes: NPRS scale: 8/10 Pain location: B knees and bottoms of her feet. Pain description: Chronic Aggravating factors: Cold weather, too much walking. Relieving factors: Has knee brace which helps, was taking Allieve, but had to stop since stroke.  PRECAUTIONS: Fall and Other: L visual field cut  RED FLAGS: None   WEIGHT BEARING RESTRICTIONS: No  FALLS: Has  patient fallen in last 6 months? Yes. Number of falls 1  LIVING ENVIRONMENT: Lives with: lives with their family Lives in: House/apartment Stairs: Yes: Internal: 18 steps; on left going up and External: 1 steps; none Has following equipment at home: Walker - 2 wheeled  PLOF: Independent  PATIENT GOALS: Patient would like to improve her balance, minimize knee pain, return her normal daily activities.  OBJECTIVE:  Note: Objective measures were completed at Evaluation unless otherwise noted.  DIAGNOSTIC FINDINGS: N/A  COGNITION: Overall cognitive status: Within functional limits for tasks assessed   SENSATION: Patient denies change  COORDINATION: Heel slide on shin, WNL  EDEMA:  Patient denies  MUSCLE TONE: WNL  MUSCLE LENGTH: Hamstrings: Right 75 deg; Left 60 deg Thomas test: WNL B.  POSTURE: increased thoracic kyphosis and weight shift right  LOWER EXTREMITY ROM:   WNL B.  LOWER EXTREMITY MMT:  R 5/5  MMT Right Eval Left Eval  Hip flexion  4-  Hip extension  4-  Hip abduction  4-  Hip adduction    Hip internal rotation    Hip external rotation    Knee flexion  4  Knee extension  4  Ankle dorsiflexion  4  Ankle plantarflexion  4-  Ankle inversion    Ankle eversion    (Blank rows = not tested)  BED MOBILITY:  I  TRANSFERS: I  RAMP:  S  CURB:  CGA  STAIRS: Level of Assistance: SBA Stair Negotiation Technique: Step to Pattern with Single Rail on Left Number of Stairs: 18  Height of Stairs: 6"  Comments: Patient reports she is stable with a rail  GAIT: Gait pattern: step through pattern, decreased arm swing- Right, decreased arm swing- Left, shuffling, and lateral hip instability Distance walked: in clinic distances Assistive device utilized: None Level of assistance: SBA Comments: Patient reports that she walks with her son or husband when outside of her house for safety.  FUNCTIONAL TESTS:  5 times sit to stand: 18.81 Berg Balance  Scale: 39 Functional gait assessment: 16  TREATMENT DATE:  08/28/23 NuStep L4 x 6 minutes Tandem stance progression-In parallel bars-Static, arm swing, weight shift forward and back, passing 3# weight side to side, runners stride, x 10 each with each leg in front. Repeat with feet on airex. Performed alternating march in parallel stance rather than tandem, more unstable. Resisted Dead lifts holding R tband in each hand, under feet, 2 x 10 reps. Side to side stepping in parallel bars, increasing speed with repetitions Repeat on airex plank x 5 each way, not focused on speed. No UE support. Ambulation, 1 x 300', mildly SOB, but no unsteadiness or pain noted. MH to B knees in sitting x 5 minutes.  08/24/23 - Seated Hip Abduction with Resistance  - 1 x daily - 7 x weekly - 1-2 sets - 10 reps - 3 seconds hold (yellow band) - Sit to Stand  - 1 x daily - 7 x weekly - 1-2 sets - 5-10 reps (holding 2lb ball at chest) - Side Stepping with Counter Support  - 1 x daily - 7 x weekly - 2-3 sets - 10 reps (PT in front of patient without UE support) - Heel Raises with Counter Support  - 1 x daily - 7 x weekly - 1-2 sets - 10 reps - Standing March with Counter Support  - 1 x daily - 7 x weekly - 1-2 sets - 10 reps - Standing Hip Abduction with Counter Support  - 1 x daily - 7 x weekly - 1 sets - 10 reps (mirror feedback at ) - PPT x12 - Bridge 2x10 - lumbar rotations x10  08/20/23 Nustep level 5 x 6 minutes Leg curls 20# 2x10 LAQ 2x10 2.5# - pulls on left knee Step on and off airex In pbars step turn touch In front of bench feet stationary turn and touch cone to side and behind Then did the same on airex, had a loss of balance and went back onto bench sitting On airex catch and volleyball Turn and toss with feet stationary Walking ball toss  08/15/23 Education    PATIENT  EDUCATION: Education details: POC Person educated: Patient Education method: Explanation Education comprehension: verbalized understanding  HOME EXERCISE PROGRAM: Access Code: [email or web address] URL: [email or web address] Date: 08/24/2023 Prepared by: Gardiner Rhyme  Exercises - Seated Hip Abduction with Resistance  - 1 x daily - 7 x weekly - 1-2 sets - 10 reps - 3 seconds hold - Sit to Stand  - 1 x daily - 7 x weekly - 1-2 sets - 5-10 reps - Side Stepping with Counter Support  - 1 x daily - 7 x weekly - 2-3 sets - 10 reps - Heel Raises with Counter Support  - 1 x daily - 7 x weekly - 1-2 sets - 10 reps - Standing March with Counter Support  - 1 x daily - 7 x weekly - 1-2 sets - 10 reps - Standing Hip Abduction with Counter Support  - 1 x daily - 7 x weekly - 1 sets - 10 reps  GOALS: Goals reviewed with patient? Yes  SHORT TERM GOALS: Target date: 09/04/23  I with initial HEP Baseline: Goal status: 08/28/23-met  LONG TERM GOALS: Target date: 10/10/23  I with final HEP Baseline:  Goal status: INITIAL  2.  Complete 5 x STS without UE support in < 15 sec Baseline: 18.8, used UE Goal status: INITIAL  3.  Improve BERG score to at least 45/56 to indicate minimal fall risk Baseline: 39 Goal status: INITIAL  4.  Improve FGA score to at least 24/28 Baseline: 16 Goal status: INITIAL  5.  Increase L LE strength and trunk strength to at least 4+/5 to provide better support and help decrease knee pain. Baseline:  Goal status: INITIAL  6. Patient will walk at least 500' on level and unlevel surfaces I with no instability noted.  Baseline: Multiple gait abnormalities and limitations noted.  Goal Status: INITIAL  ASSESSMENT:  CLINICAL IMPRESSION: Continued with progression of strengthening and balance training. She tolerated more challenging activities including on soft surfaces. Knee pain minimal today. Finished with some heat for the knees to prevent flair up after  activity.  Patient is a 68 y.o. who was seen today for physical therapy evaluation and treatment S/P R CVA. She presents with mild L sided weakness, decreased balance, decreased safety and I with all functional mobility. She also has a L visual field cut, and chronic B knee pain. She will benefit from PT to address her deficits in order to to improve her safety and I with all mobility, return to her normal daily activities and independence.  OBJECTIVE IMPAIRMENTS: decreased activity tolerance, decreased balance, decreased coordination, decreased endurance, decreased mobility, difficulty walking, decreased strength, and postural dysfunction.   ACTIVITY LIMITATIONS: standing, squatting, stairs, and locomotion level  PARTICIPATION LIMITATIONS: meal prep, cleaning, laundry, driving, shopping, and community activity  PERSONAL FACTORS: Past/current experiences are also affecting patient's functional outcome.   REHAB POTENTIAL: Good  CLINICAL DECISION MAKING: Stable/uncomplicated  EVALUATION COMPLEXITY: Moderate  PLAN:  PT FREQUENCY: 2x/week  PT DURATION: 10 weeks  PLANNED INTERVENTIONS: 97110-Therapeutic exercises, 97530- Therapeutic activity, 97112- Neuromuscular re-education, 97535- Self Care, 40981- Manual therapy, [phone]- Gait training, [phone]- Electrical stimulation (unattended), Patient/Family education, Balance training, Stair training, and Joint mobilization  PLAN FOR NEXT SESSION: Review HEP/update PRN, progress strength and balance, NMR   Oley Balm DPT 08/28/23 11:42 AM

## 2023-08-29 ENCOUNTER — Telehealth: Payer: Self-pay

## 2023-08-29 NOTE — Telephone Encounter (Signed)
-----   Message from Philomena Doheny sent at 08/22/2023  2:39 PM EST ----- Regarding: $630 copay for meds? Hi Maria Rose! This is a new patient who I saw today. She is on Eliquis, Carvedilol, Pantoprazole, and insulin Glargine and has [employer] for [employer] She had a coupon for Eliquis but says she paid about $630 as a copay for all her refills. Can you call her and clarify what that's about? Her insulin prescription says to substitute as needed for insurance purposes. She has enough refills for now but will need them in about 2-3 weeks. Thank you so much!  Priscila

## 2023-08-29 NOTE — Telephone Encounter (Signed)
Reached out to pharmacy regarding copays for medications:  All medications fills 08/12/23  Eliquis $0 Carvedilol $5.78 Pantoprazole $6.68 Basaglar $97.33 (50 day supply)  Unsure where the $630 amount came from. All meds were billed to her insurance.

## 2023-08-30 NOTE — Therapy (Signed)
OUTPATIENT OCCUPATIONAL THERAPY NEURO EVALUATION  Patient Name: Maria Rose MRN: [id number] DOB:[birth year], 68 y.o., female Today's Date: 08/31/2023  PCP: none REFERRING PROVIDER: Mercie Eon, MD  END OF SESSION:  OT End of Session - 08/31/23 0935     Visit Number 4    Number of Visits 25    Date for OT Re-Evaluation 11/07/23    Authorization Type [employer]    Progress Note Due on Visit 10    OT Start Time 0933    OT Stop Time 1015    OT Time Calculation (min) 42 min    Activity Tolerance Patient tolerated treatment well    Behavior During Therapy [facility] for tasks assessed/performed                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   Past Surgical History:  Procedure Laterality Date   ESOPHAGOGASTRODUODENOSCOPY (EGD) WITH PROPOFOL N/A 07/31/2023   Procedure: ESOPHAGOGASTRODUODENOSCOPY (EGD) WITH PROPOFOL;  Surgeon: Jenel Lucks, MD;  Location: [facility] ENDOSCOPY;  Service: Gastroenterology;  Laterality: N/A;   HEMOSTASIS CONTROL  07/31/2023   Procedure: HEMOSTASIS CONTROL;  Surgeon: Jenel Lucks, MD;  Location: [facility] ENDOSCOPY;  Service: Gastroenterology;;   IR ANGIOGRAM VISCERAL SELECTIVE  08/07/2023   IR ANGIOGRAM VISCERAL SELECTIVE  08/07/2023   IR EMBO VENOUS NOT HEMORR HEMANG  INC GUIDE ROADMAPPING  08/05/2023   IR RENAL SELECTIVE  UNI INC S&I MOD SED  08/07/2023   IR US GUIDE VASC ACCESS RIGHT  08/05/2023   RADIOLOGY WITH ANESTHESIA N/A 08/05/2023   Procedure: IR WITH ANESTHESIA -TIPS;  Surgeon: Radiologist, Medication, MD;  Location: [facility] OR;  Service: Radiology;  Laterality: N/A;   Patient Active Problem List   Diagnosis Date Noted   Hepatic cirrhosis (HCC) 08/14/2023   Other cirrhosis of liver (HCC) 08/11/2023   Iron deficiency anemia 08/11/2023   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   Decompensated cirrhosis  (HCC) 07/31/2023   Bleeding gastric varices 07/31/2023   Cerebrovascular accident (CVA) (HCC) 07/31/2023   Type 2 diabetes mellitus (HCC) 07/30/2023    ONSET DATE: 07/30/23  REFERRING DIAG: I63.9 (ICD-10-CM) - Stroke (HCC)   THERAPY DIAG:  Visuospatial deficit  Frontal lobe and executive function deficit  Other lack of coordination  Attention and concentration deficit  Unsteadiness on feet  Muscle weakness (generalized)  Rationale for Evaluation and Treatment: Rehabilitation  SUBJECTIVE:   SUBJECTIVE STATEMENT: Pt reports that nose isn't any better, goes back to doctor soon.  Pt denies pain.  Pt reports that environmental scanning was easier today.  Pt accompanied by: self  PERTINENT HISTORY: Per chart, 68 y.o. female presented to [facility] hospital on 07/30/2023 after a fall and shaking episode. 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 Additional punctate acute infarcts in right cerebellum, left temporal lobe, left occipital lobe, right occipital lobe, right parietal lobe, right frontal lobe, right thalamus, left frontal lobe. Pt with bloody bowel movement and hematemesis on 12/24.  While hospitalized  she suffered a brisk upper GI bleed associated with profound hypotension and hemoglobin drop from 10 to 5.  She underwent an upper endoscopy which showed grade 1 varices and gastric varices with bleeding stigmata, attributed as the bleeding source.  She underwent BRTO on December 29 and a subsequent CT angiogram  showed successful occlusion of the gastric varices with splenorenal shunt, but did show an acute nonocclusive SMV thrombus.  She was started on IV heparin and transitioned to oral anticoagulation. Prior to this admission, the patient had no idea she had liver disease or cirrhosis.  PMH includes hepatic cirrhosis, gastric and splenic varices, sepsis. Pt d/c home  08/12/23   PRECAUTIONS: Fall  WEIGHT BEARING RESTRICTIONS: No  PAIN:  Are you having pain? No  FALLS: Has patient fallen in last 6 months? No  LIVING ENVIRONMENT: Lives with: lives with their family Lives in: House/apartment Stairs: Yes: Internal: 15 steps; on right going up and on left going up Has following equipment at home: Dan Humphreys - 2 wheeled  PLOF: Independent  PATIENT GOALS: maintian I   OBJECTIVE:  Note: Objective measures were completed at Evaluation unless otherwise noted.  HAND DOMINANCE: Left  ADLs: Overall ADLs: supervision-mod I with all basic ADLs. Transfers/ambulation related to ADLs:3 Eating: mod I Grooming: mod I UB Dressing: mod I LB Dressing: mod I Toileting: supervision Bathing: sponge bath Tub Shower transfers: not performing   IADLs:  Meal Prep: supervision  for simple breakfast prep, has not attempted previosly level of cooking Community mobility: supervision Medication management: keeps up with meds , has pillbox but has not set up Financial management: has some auto bill pay set up Handwriting: 100% legible  MOBILITY STATUS: supervision  POSTURE COMMENTS:    ACTIVITY TOLERANCE: Activity tolerance: impaired per pt report   UPPER EXTREMITY ROM:  WFLS    UPPER EXTREMITY MMT:     MMT Right eval Left eval  Shoulder flexion 4+/5 4-/5  Shoulder abduction    Shoulder adduction    Shoulder extension    Shoulder internal rotation    Shoulder external rotation    Middle trapezius    Lower trapezius    Elbow flexion 4+/5 4+/5  Elbow extension 4+/5 4/5  Wrist flexion    Wrist extension    Wrist ulnar deviation    Wrist radial deviation    Wrist pronation    Wrist supination    (Blank rows = not tested)  HAND FUNCTION: Grip strength: Right: 30 lbs; Left: 45 lbs  COORDINATION: 9 Hole Peg test: Right: 31.39 sec; Left: 34.09 sec  SENSATION: WFL   COGNITION: Overall cognitive status: Impaired-  impaired short term  meory, 2/3 words recalled after short delay, spells WORLD backwards without difficulty. Clock drawing task: pt wrote numbers backwards on clock face with 11, 10 , 9 on right side of clock.  Per speech therapy pt completed medication management task correctly.   VISION: Subjective report: Pt reports something is off Baseline vision:  usually wears contacts   VISION ASSESSMENT: Gaze preference/alignment: gaze right Tracking/Visual pursuits: Decreased smoothness of eye movement to Left  visual field Saccades: impaired Visual Fields: Left visual field deficits   PERCEPTION: Impaired: Inattention/neglect: does not attend to left visual field, number cancellation- 2/80 missed on left margin- discorganized scan pattern    OBSERVATIONS: Pleasant female who appears overwhelmed by current situation. She had not seen MD in many years prior to CVA  TREATMENT DATE:   08/31/23:   Ambulating and performing environmental scanning for cards in numerical order (simple) with 100% accuracy without cueing and good balance.   Placing small pegs in pegboard to copy design for incr L hand coordination and visual scanning/cognition with 100% accuracy without cueing.  Completing weekly schedule task for planning/problem solving and tabletop scanning with only 1 omission with min cueing to correct.  Horizontal word search for tabletop visual scanning and to encourage organized scanning pattern with min cueing to mark off words that she has found,  pt found 18/23 items initially with difficulty finding remaining, but due to time constraints, pt instructed to locate remaining at home for homework.   08/24/23:   Copying PVC design with min difficulty x2, but able to problem solve, perform visual scanning, and self correct without cueing with first design, but needed min cueing for 2nd  design.  "Organizing Your Day"  #1 for complex problem solving/planning with incr time, min-mod cueing for organization (write to do list first as strategy to decr risk of omissions and check off), omissions (2), and problem solving.      08/20/23-  Environemntal scanning in a moderately busy environment 5/14 (64%)items missed on first pass. Min questioning cues to locate remainder of items, 3/5 items were on left side  Copying small peg design with LUE for fine motor coordination with a visual/ cognitve component, pt completed correctly without v.c Word search to encourage visual scanning in organized fashion, 17 words located in 8 mins, min v.c for line guide  08/15/23 eval       PATIENT EDUCATION:   Education details:  see above for cueing/strategies  Person educated: Patient Education method: Explanation Education comprehension: verbalized understanding  HOME EXERCISE PROGRAM: not issued yet   GOALS: Goals reviewed with patient? Yes  SHORT TERM GOALS: Target date: 09/14/23  I with inital HEP including LUE coordination  Goal status: INITIAL  2.  Pt will verbalize understanding of compensatory strategies for visual deficits.  Goal status: INITIAL  3.  Pt will perform tabletop scanning activities with a cognitve component with 95% or better accuracy.  Goal status: INITIAL  4.  Pt will perform basic environmental scanning with 90% or better accuracy.  Goal status: INITIAL  5.  Pt will perfrom all basic ADLs mod I  Goal status: INITIAL 6. Pt will verbalize understanding of compensatory strategies for cognitve deficits. Goal status: new    LONG TERM GOALS: Target date: 11/07/23  I with updated HEP proximal strength  Goal status: INITIAL  2.  Pt will perform a physical and cogntive task simultaneously with 90% or better accuracy.  Goal status: INITIAL  3.  Pt will perform mod complex environmental scanning( with a cognitve component) with 90% or better  accuracy.  Goal status: INITIAL  4.  Pt will perform a functional organization/ planning  task with 90% or better accuracy.( generating a grocery list/ meal planning etc)  Goal status: INITIAL  5.  Pt will resume cooking and performing home management activities mod I   Goal status: INITIAL   ASSESSMENT:  CLINICAL IMPRESSION: Patient is a progressing towards goals with improving visual scanning and cognition.  Pt with 100% accuracy with simple environmental scanning.   PERFORMANCE DEFICITS: in functional skills including ADLs, IADLs, coordination, dexterity, strength, Fine motor control, mobility, balance, decreased knowledge of precautions, decreased knowledge of use of DME, vision, and UE functional use, cognitive skills including attention, memory, perception, problem solving, and safety awareness,  and psychosocial skills including coping strategies, environmental adaptation, habits, interpersonal interactions, and routines and behaviors.   IMPAIRMENTS: are limiting patient from ADLs, IADLs, play, leisure, and social participation.   CO-MORBIDITIES: may have co-morbidities  that affects occupational performance. Patient will benefit from skilled OT to address above impairments and improve overall function.  MODIFICATION OR ASSISTANCE TO COMPLETE EVALUATION: No modification of tasks or assist necessary to complete an evaluation.  OT OCCUPATIONAL PROFILE AND HISTORY: Detailed assessment: Review of records and additional review of physical, cognitive, psychosocial history related to current functional performance.  CLINICAL DECISION MAKING: LOW - limited treatment options, no task modification necessary  REHAB POTENTIAL: Good  EVALUATION COMPLEXITY: Low    PLAN:  OT FREQUENCY: 2x/week  OT DURATION: 12 weeks- may d/c sooner based upon pt progress.  PLANNED INTERVENTIONS: 97168 OT Re-evaluation, 97535 self care/ADL training, 11914 therapeutic exercise, 97530 therapeutic  activity, 97112 neuromuscular re-education, 97140 manual therapy, 97035 ultrasound, 97018 paraffin, 78295 moist heat, 97010 cryotherapy, 97034 contrast bath, 97129 Cognitive training (first 15 min), 62130 Cognitive training(each additional 15 min), passive range of motion, balance training, functional mobility training, visual/perceptual remediation/compensation, energy conservation, coping strategies training, patient/family education, and DME and/or AE instructions  RECOMMENDED OTHER SERVICES: PT  CONSULTED AND AGREED WITH PLAN OF CARE: Patient  PLAN FOR NEXT SESSION   continue with environmental scanning (add cognitive components as able), functional cognition with a visual components.  Check on word search (from homework--? Educate/trial pt with use of line guide for this time of activity)   Ashar Lewinski, OTR/L 08/31/2023, 10:21 AM

## 2023-08-31 ENCOUNTER — Ambulatory Visit: Payer: Commercial Managed Care - PPO

## 2023-08-31 ENCOUNTER — Ambulatory Visit: Payer: Commercial Managed Care - PPO | Admitting: Occupational Therapy

## 2023-08-31 ENCOUNTER — Encounter: Payer: Self-pay | Admitting: Occupational Therapy

## 2023-08-31 DIAGNOSIS — I639 Cerebral infarction, unspecified: Secondary | ICD-10-CM | POA: Diagnosis not present

## 2023-08-31 DIAGNOSIS — R41840 Attention and concentration deficit: Secondary | ICD-10-CM

## 2023-08-31 DIAGNOSIS — R278 Other lack of coordination: Secondary | ICD-10-CM

## 2023-08-31 DIAGNOSIS — M6281 Muscle weakness (generalized): Secondary | ICD-10-CM

## 2023-08-31 DIAGNOSIS — R262 Difficulty in walking, not elsewhere classified: Secondary | ICD-10-CM

## 2023-08-31 DIAGNOSIS — R41844 Frontal lobe and executive function deficit: Secondary | ICD-10-CM

## 2023-08-31 DIAGNOSIS — R2681 Unsteadiness on feet: Secondary | ICD-10-CM

## 2023-08-31 DIAGNOSIS — R41842 Visuospatial deficit: Secondary | ICD-10-CM

## 2023-08-31 DIAGNOSIS — R2689 Other abnormalities of gait and mobility: Secondary | ICD-10-CM

## 2023-08-31 NOTE — Therapy (Signed)
OUTPATIENT PHYSICAL THERAPY NEURO EVALUATION   Patient Name: Maria Rose MRN: [id number] DOB:[birth year], 68 y.o., female Today's Date: 08/15/2023  PCP: N/A REFERRING PROVIDER: Mercie Eon, MD  END OF SESSION:  PT End of Session - 08/15/23 1503     Visit Number 5   Date for PT Re-Evaluation 10/10/23    PT Start Time 1015    PT Stop Time 1100    PT Time Calculation (min) 45 min    Activity Tolerance Patient tolerated treatment well    Behavior During Therapy [facility] for tasks assessed/performed            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 ENDOSCOPY;  Service: Gastroenterology;  Laterality: N/A;   HEMOSTASIS CONTROL  07/31/2023   Procedure: HEMOSTASIS CONTROL;  Surgeon: Jenel Lucks, MD;  Location: [facility] ENDOSCOPY;  Service: Gastroenterology;;   IR ANGIOGRAM VISCERAL SELECTIVE  08/07/2023   IR ANGIOGRAM VISCERAL SELECTIVE  08/07/2023   IR EMBO VENOUS NOT HEMORR HEMANG  INC GUIDE ROADMAPPING  08/05/2023   IR RENAL SELECTIVE  UNI INC S&I MOD SED  08/07/2023   IR US GUIDE VASC ACCESS RIGHT  08/05/2023   RADIOLOGY WITH ANESTHESIA N/A 08/05/2023   Procedure: IR WITH ANESTHESIA -TIPS;  Surgeon: Radiologist, Medication, MD;  Location: [facility] OR;  Service: Radiology;  Laterality: N/A;   Patient Active Problem List   Diagnosis Date Noted   Hepatic cirrhosis (HCC) 08/14/2023   Other cirrhosis of liver (HCC) 08/11/2023   Iron deficiency anemia 08/11/2023   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   Decompensated cirrhosis (HCC) 07/31/2023   Bleeding gastric varices 07/31/2023   Cerebrovascular accident (CVA) (HCC)  07/31/2023   Type 2 diabetes mellitus (HCC) 07/30/2023    ONSET DATE: 08/12/23  REFERRING DIAG: I63.9 (ICD-10-CM) - Stroke (HCC)   THERAPY DIAG:  Difficulty in walking, not elsewhere classified - Plan: PT plan of care cert/re-cert  Muscle weakness (generalized) - Plan: PT plan of care cert/re-cert  Other abnormalities of gait and mobility - Plan: PT plan of care cert/re-cert  Other lack of coordination - Plan: PT plan of care cert/re-cert  Unsteadiness on feet - Plan: PT plan of care cert/re-cert  Cerebrovascular accident (CVA), unspecified mechanism (HCC) - Plan: PT plan of care cert/re-cert  Rationale for Evaluation and Treatment: Rehabilitation  SUBJECTIVE:  SUBJECTIVE STATEMENT: Pt reports no new issues and feel she is slowly improving, HEP going well. She feels like the exercises the last couple of PT sessions have helped her knee.    PERTINENT HISTORY:  Per hospital PT evaluation note: 67 y.o. female presents to [facility] hospital on 07/30/2023 after a fall and shaking episode. 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 Additional punctate acute infarcts in right cerebellum, left temporal lobe, left occipital lobe, right occipital lobe, right parietal lobe, right frontal lobe, right thalamus, left frontal lobe. Pt with bloody bowel movement and hematemesis on 12/24. PMH includes hepatic cirrhosis, gastric and splenic varices, sepsis.   PAIN:  Are you having pain? Yes: NPRS scale: 6/10 Pain location: L knee Pain description: Chronic Aggravating factors: Cold weather, too much walking. Relieving factors: Has knee brace which helps, was taking Allieve, but had to stop since stroke.  PRECAUTIONS:  Fall and Other: L visual field cut  RED FLAGS: None   WEIGHT BEARING RESTRICTIONS: No  FALLS: Has patient fallen in last 6 months? Yes. Number of falls 1  LIVING ENVIRONMENT: Lives with: lives with their family Lives in: House/apartment Stairs: Yes: Internal: 18 steps; on left going up and External: 1 steps; none Has following equipment at home: Walker - 2 wheeled  PLOF: Independent  PATIENT GOALS: Patient would like to improve her balance, minimize knee pain, return her normal daily activities.  OBJECTIVE:  Note: Objective measures were completed at Evaluation unless otherwise noted.  DIAGNOSTIC FINDINGS: N/A  COGNITION: Overall cognitive status: Within functional limits for tasks assessed   SENSATION: Patient denies change  COORDINATION: Heel slide on shin, WNL  EDEMA:  Patient denies  MUSCLE TONE: WNL  MUSCLE LENGTH: Hamstrings: Right 75 deg; Left 60 deg Thomas test: WNL B.  POSTURE: increased thoracic kyphosis and weight shift right  LOWER EXTREMITY ROM:   WNL B.  LOWER EXTREMITY MMT:  R 5/5  MMT Right Eval Left Eval  Hip flexion  4-  Hip extension  4-  Hip abduction  4-  Hip adduction    Hip internal rotation    Hip external rotation    Knee flexion  4  Knee extension  4  Ankle dorsiflexion  4  Ankle plantarflexion  4-  Ankle inversion    Ankle eversion    (Blank rows = not tested)  BED MOBILITY:  I  TRANSFERS: I  RAMP:  S  CURB:  CGA  STAIRS: Level of Assistance: SBA Stair Negotiation Technique: Step to Pattern with Single Rail on Left Number of Stairs: 18  Height of Stairs: 6"  Comments: Patient reports she is stable with a rail  GAIT: Gait pattern: step through pattern, decreased arm swing- Right, decreased arm swing- Left, shuffling, and lateral hip instability Distance walked: in clinic distances Assistive device utilized: None Level of assistance: SBA Comments: Patient reports that she walks with her son or husband  when outside of her house for safety.  FUNCTIONAL TESTS:  5 times sit to stand: 18.81 Berg Balance Scale: 39 Functional gait assessment: 16  TREATMENT DATE  [facility] Adult PT Treatment:                                                DATE: 08/31/23 Therapeutic Exercise: Hands across chest to perform STS from chair x10 with eccentric control improving through session Step ups onto 4" stair with uni UE support with slow eccentric x10 ea  Neuromuscular re-ed: the following were performed in  bars to bring awareness to B hemibody, promoting anticipatory and reactionary balance with occasional visual feedback. Feet hip width eyes closed FTEO, FTEC x20" each Semi tandem B x20" each, able to perform tandem with RLE back with heel and toe side by side Feet hip width eyes closed on airex x20" FTEO on airex Marching on airex x20 Step ups onto airex x15 ea Step ups alt x12 Anterior weight shift with head turns Airex beam tandem with BUE support x5 laps Airex beam side stepping with BUE support x3 laps Tandem and reverse tandem walking x3 laps   08/28/23 NuStep L4 x 6 minutes Tandem stance progression-In parallel bars-Static, arm swing, weight shift forward and back, passing 3# weight side to side, runners stride, x 10 each with each leg in front. Repeat with feet on airex. Performed alternating march in parallel stance rather than tandem, more unstable. Resisted Dead lifts holding R tband in each hand, under feet, 2 x 10 reps. Side to side stepping in parallel bars, increasing speed with repetitions Repeat on airex plank x 5 each way, not focused on speed. No UE support. Ambulation, 1 x 300', mildly SOB, but no unsteadiness or pain noted. MH to B knees in sitting x 5 minutes.  08/24/23 - Seated Hip Abduction with Resistance  - 1 x daily - 7 x weekly - 1-2 sets - 10 reps -  3 seconds hold (yellow band) - Sit to Stand  - 1 x daily - 7 x weekly - 1-2 sets - 5-10 reps (holding 2lb ball at chest) - Side Stepping with Counter Support  - 1 x daily - 7 x weekly - 2-3 sets - 10 reps (PT in front of patient without UE support) - Heel Raises with Counter Support  - 1 x daily - 7 x weekly - 1-2 sets - 10 reps - Standing March with Counter Support  - 1 x daily - 7 x weekly - 1-2 sets - 10 reps - Standing Hip Abduction with Counter Support  - 1 x daily - 7 x weekly - 1 sets - 10 reps (mirror feedback at ) - PPT x12 - Bridge 2x10 - lumbar rotations x10  08/20/23 Nustep level 5 x 6 minutes Leg curls 20# 2x10 LAQ 2x10 2.5# - pulls on left knee Step on and off airex In pbars step turn touch In front of bench feet stationary turn and touch cone to side and behind Then did the same on airex, had a loss of balance and went back onto bench sitting On airex catch and volleyball Turn and toss with feet stationary Walking ball toss  08/15/23 Education    PATIENT EDUCATION: Education details: POC Person educated: Patient Education method: Explanation Education comprehension: verbalized understanding  HOME EXERCISE PROGRAM: Access Code: [email or web address] URL: [email or web address] Date: 08/24/2023 Prepared by: Gardiner Rhyme  Exercises - Seated Hip Abduction with Resistance  - 1 x daily - 7 x weekly - 1-2  sets - 10 reps - 3 seconds hold - Sit to Stand  - 1 x daily - 7 x weekly - 1-2 sets - 5-10 reps - Side Stepping with Counter Support  - 1 x daily - 7 x weekly - 2-3 sets - 10 reps - Heel Raises with Counter Support  - 1 x daily - 7 x weekly - 1-2 sets - 10 reps - Standing March with Counter Support  - 1 x daily - 7 x weekly - 1-2 sets - 10 reps - Standing Hip Abduction with Counter Support  - 1 x daily - 7 x weekly - 1 sets - 10 reps  GOALS: Goals reviewed with patient? Yes  SHORT TERM GOALS: Target date: 09/04/23  I with initial HEP Baseline: Goal status:  08/28/23-met  LONG TERM GOALS: Target date: 10/10/23  I with final HEP Baseline:  Goal status: INITIAL  2.  Complete 5 x STS without UE support in < 15 sec Baseline: 18.8, used UE Goal status: INITIAL  3.  Improve BERG score to at least 45/56 to indicate minimal fall risk Baseline: 39 Goal status: INITIAL  4.  Improve FGA score to at least 24/28 Baseline: 16 Goal status: INITIAL  5.  Increase L LE strength and trunk strength to at least 4+/5 to provide better support and help decrease knee pain. Baseline:  Goal status: INITIAL  6. Patient will walk at least 500' on level and unlevel surfaces I with no instability noted.  Baseline: Multiple gait abnormalities and limitations noted.  Goal Status: INITIAL  ASSESSMENT:  CLINICAL IMPRESSION: Pt presents with gradual improvement in strength, activity tolerance and balance. She tolerated treatment well with focus on standing balance today, and no adverse reactions or overt falls. Pt verbalized decrease in L knee pain from the past couple of PT sessions and being more active at home with HEP. Pt will continue to benefit from strength and balance progressions to minimize falls risk and maximize independence.   Patient is a 68 y.o. who was seen today for physical therapy evaluation and treatment S/P R CVA. She presents with mild L sided weakness, decreased balance, decreased safety and I with all functional mobility. She also has a L visual field cut, and chronic B knee pain. She will benefit from PT to address her deficits in order to to improve her safety and I with all mobility, return to her normal daily activities and independence.  OBJECTIVE IMPAIRMENTS: decreased activity tolerance, decreased balance, decreased coordination, decreased endurance, decreased mobility, difficulty walking, decreased strength, and postural dysfunction.   ACTIVITY LIMITATIONS: standing, squatting, stairs, and locomotion level  PARTICIPATION LIMITATIONS:  meal prep, cleaning, laundry, driving, shopping, and community activity  PERSONAL FACTORS: Past/current experiences are also affecting patient's functional outcome.   REHAB POTENTIAL: Good  CLINICAL DECISION MAKING: Stable/uncomplicated  EVALUATION COMPLEXITY: Moderate  PLAN:  PT FREQUENCY: 2x/week  PT DURATION: 10 weeks  PLANNED INTERVENTIONS: 97110-Therapeutic exercises, 97530- Therapeutic activity, 97112- Neuromuscular re-education, 97535- Self Care, 69629- Manual therapy, [phone]- Gait training, [phone]- Electrical stimulation (unattended), Patient/Family education, Balance training, Stair training, and Joint mobilization  PLAN FOR NEXT SESSION: Review HEP/update PRN, progress strength and balance, NMR   Bettey Mare. Amitai Delaughter, PT, DPT 08/31/23 11:00 AM

## 2023-08-31 NOTE — Therapy (Deleted)
OUTPATIENT PHYSICAL THERAPY NEURO EVALUATION   Patient Name: Maria Rose MRN: [id number] DOB:10/17/1955, 68 y.o., female Today's Date: 08/15/2023  PCP: N/A REFERRING PROVIDER: Mercie Eon, MD  END OF SESSION:  PT End of Session - 08/15/23 1503     Visit Number 1    Date for PT Re-Evaluation 10/10/23    PT Start Time 0930    PT Stop Time 1010    PT Time Calculation (min) 40 min    Activity Tolerance Patient tolerated treatment well    Behavior During Therapy [facility] for tasks assessed/performed            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   Past Surgical History:  Procedure Laterality Date   ESOPHAGOGASTRODUODENOSCOPY (EGD) WITH PROPOFOL N/A 07/31/2023   Procedure: ESOPHAGOGASTRODUODENOSCOPY (EGD) WITH PROPOFOL;  Surgeon: Jenel Lucks, MD;  Location: [facility] ENDOSCOPY;  Service: Gastroenterology;  Laterality: N/A;   HEMOSTASIS CONTROL  07/31/2023   Procedure: HEMOSTASIS CONTROL;  Surgeon: Jenel Lucks, MD;  Location: [facility] ENDOSCOPY;  Service: Gastroenterology;;   IR ANGIOGRAM VISCERAL SELECTIVE  08/07/2023   IR ANGIOGRAM VISCERAL SELECTIVE  08/07/2023   IR EMBO VENOUS NOT HEMORR HEMANG  INC GUIDE ROADMAPPING  08/05/2023   IR RENAL SELECTIVE  UNI INC S&I MOD SED  08/07/2023   IR US GUIDE VASC ACCESS RIGHT  08/05/2023   RADIOLOGY WITH ANESTHESIA N/A 08/05/2023   Procedure: IR WITH ANESTHESIA -TIPS;  Surgeon: Radiologist, Medication, MD;  Location: [facility] OR;  Service: Radiology;  Laterality: N/A;   Patient Active Problem List   Diagnosis Date Noted   Hepatic cirrhosis (HCC) 08/14/2023   Other cirrhosis of liver (HCC) 08/11/2023   Iron deficiency anemia 08/11/2023   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   Decompensated cirrhosis (HCC) 07/31/2023   Bleeding gastric varices 07/31/2023   Cerebrovascular accident (CVA) (HCC)  07/31/2023   Type 2 diabetes mellitus (HCC) 07/30/2023    ONSET DATE: 08/12/23  REFERRING DIAG: I63.9 (ICD-10-CM) - Stroke (HCC)   THERAPY DIAG:  Difficulty in walking, not elsewhere classified - Plan: PT plan of care cert/re-cert  Muscle weakness (generalized) - Plan: PT plan of care cert/re-cert  Other abnormalities of gait and mobility - Plan: PT plan of care cert/re-cert  Other lack of coordination - Plan: PT plan of care cert/re-cert  Unsteadiness on feet - Plan: PT plan of care cert/re-cert  Cerebrovascular accident (CVA), unspecified mechanism (HCC) - Plan: PT plan of care cert/re-cert  Rationale for Evaluation and Treatment: Rehabilitation  SUBJECTIVE:  SUBJECTIVE STATEMENT: Pt reports no new issues.  PERTINENT HISTORY:  Per hospital PT evaluation note: 68 y.o. female presents to [facility] hospital on 07/30/2023 after a fall and shaking episode. 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 Additional punctate acute infarcts in right cerebellum, left temporal lobe, left occipital lobe, right occipital lobe, right parietal lobe, right frontal lobe, right thalamus, left frontal lobe. Pt with bloody bowel movement and hematemesis on 12/24. PMH includes hepatic cirrhosis, gastric and splenic varices, sepsis.   PAIN:  Are you having pain? Yes: NPRS scale: 8/10 Pain location: B knees and bottoms of her feet. Pain description: Chronic Aggravating factors: Cold weather, too much walking. Relieving factors: Has knee brace which helps, was taking Allieve, but had to stop since stroke.  PRECAUTIONS: Fall and Other: L visual field cut  RED FLAGS: None   WEIGHT BEARING RESTRICTIONS: No  FALLS: Has  patient fallen in last 6 months? Yes. Number of falls 1  LIVING ENVIRONMENT: Lives with: lives with their family Lives in: House/apartment Stairs: Yes: Internal: 18 steps; on left going up and External: 1 steps; none Has following equipment at home: Walker - 2 wheeled  PLOF: Independent  PATIENT GOALS: Patient would like to improve her balance, minimize knee pain, return her normal daily activities.  OBJECTIVE:  Note: Objective measures were completed at Evaluation unless otherwise noted.  DIAGNOSTIC FINDINGS: N/A  COGNITION: Overall cognitive status: Within functional limits for tasks assessed   SENSATION: Patient denies change  COORDINATION: Heel slide on shin, WNL  EDEMA:  Patient denies  MUSCLE TONE: WNL  MUSCLE LENGTH: Hamstrings: Right 75 deg; Left 60 deg Thomas test: WNL B.  POSTURE: increased thoracic kyphosis and weight shift right  LOWER EXTREMITY ROM:   WNL B.  LOWER EXTREMITY MMT:  R 5/5  MMT Right Eval Left Eval  Hip flexion  4-  Hip extension  4-  Hip abduction  4-  Hip adduction    Hip internal rotation    Hip external rotation    Knee flexion  4  Knee extension  4  Ankle dorsiflexion  4  Ankle plantarflexion  4-  Ankle inversion    Ankle eversion    (Blank rows = not tested)  BED MOBILITY:  I  TRANSFERS: I  RAMP:  S  CURB:  CGA  STAIRS: Level of Assistance: SBA Stair Negotiation Technique: Step to Pattern with Single Rail on Left Number of Stairs: 18  Height of Stairs: 6"  Comments: Patient reports she is stable with a rail  GAIT: Gait pattern: step through pattern, decreased arm swing- Right, decreased arm swing- Left, shuffling, and lateral hip instability Distance walked: in clinic distances Assistive device utilized: None Level of assistance: SBA Comments: Patient reports that she walks with her son or husband when outside of her house for safety.  FUNCTIONAL TESTS:  5 times sit to stand: 18.81 Berg Balance  Scale: 39 Functional gait assessment: 16  TREATMENT DATE:  08/28/23 NuStep L4 x 6 minutes Tandem stance progression-In parallel bars-Static, arm swing, weight shift forward and back, passing 3# weight side to side, runners stride, x 10 each with each leg in front. Repeat with feet on airex. Performed alternating march in parallel stance rather than tandem, more unstable. Resisted Dead lifts holding R tband in each hand, under feet, 2 x 10 reps. Side to side stepping in parallel bars, increasing speed with repetitions Repeat on airex plank x 5 each way, not focused on speed. No UE support. Ambulation, 1 x 300', mildly SOB, but no unsteadiness or pain noted. MH to B knees in sitting x 5 minutes.  08/24/23 - Seated Hip Abduction with Resistance  - 1 x daily - 7 x weekly - 1-2 sets - 10 reps - 3 seconds hold (yellow band) - Sit to Stand  - 1 x daily - 7 x weekly - 1-2 sets - 5-10 reps (holding 2lb ball at chest) - Side Stepping with Counter Support  - 1 x daily - 7 x weekly - 2-3 sets - 10 reps (PT in front of patient without UE support) - Heel Raises with Counter Support  - 1 x daily - 7 x weekly - 1-2 sets - 10 reps - Standing March with Counter Support  - 1 x daily - 7 x weekly - 1-2 sets - 10 reps - Standing Hip Abduction with Counter Support  - 1 x daily - 7 x weekly - 1 sets - 10 reps (mirror feedback at ) - PPT x12 - Bridge 2x10 - lumbar rotations x10  08/20/23 Nustep level 5 x 6 minutes Leg curls 20# 2x10 LAQ 2x10 2.5# - pulls on left knee Step on and off airex In pbars step turn touch In front of bench feet stationary turn and touch cone to side and behind Then did the same on airex, had a loss of balance and went back onto bench sitting On airex catch and volleyball Turn and toss with feet stationary Walking ball toss  08/15/23 Education    PATIENT  EDUCATION: Education details: POC Person educated: Patient Education method: Explanation Education comprehension: verbalized understanding  HOME EXERCISE PROGRAM: Access Code: [email or web address] URL: [email or web address] Date: 08/24/2023 Prepared by: Gardiner Rhyme  Exercises - Seated Hip Abduction with Resistance  - 1 x daily - 7 x weekly - 1-2 sets - 10 reps - 3 seconds hold - Sit to Stand  - 1 x daily - 7 x weekly - 1-2 sets - 5-10 reps - Side Stepping with Counter Support  - 1 x daily - 7 x weekly - 2-3 sets - 10 reps - Heel Raises with Counter Support  - 1 x daily - 7 x weekly - 1-2 sets - 10 reps - Standing March with Counter Support  - 1 x daily - 7 x weekly - 1-2 sets - 10 reps - Standing Hip Abduction with Counter Support  - 1 x daily - 7 x weekly - 1 sets - 10 reps  GOALS: Goals reviewed with patient? Yes  SHORT TERM GOALS: Target date: 09/04/23  I with initial HEP Baseline: Goal status: 08/28/23-met  LONG TERM GOALS: Target date: 10/10/23  I with final HEP Baseline:  Goal status: INITIAL  2.  Complete 5 x STS without UE support in < 15 sec Baseline: 18.8, used UE Goal status: INITIAL  3.  Improve BERG score to at least 45/56 to indicate minimal fall risk Baseline: 39 Goal status: INITIAL  4.  Improve FGA score to at least 24/28 Baseline: 16 Goal status: INITIAL  5.  Increase L LE strength and trunk strength to at least 4+/5 to provide better support and help decrease knee pain. Baseline:  Goal status: INITIAL  6. Patient will walk at least 500' on level and unlevel surfaces I with no instability noted.  Baseline: Multiple gait abnormalities and limitations noted.  Goal Status: INITIAL  ASSESSMENT:  CLINICAL IMPRESSION: Continued with progression of strengthening and balance training. She tolerated more challenging activities including on soft surfaces. Knee pain minimal today. Finished with some heat for the knees to prevent flair up after  activity.  Patient is a 68 y.o. who was seen today for physical therapy evaluation and treatment S/P R CVA. She presents with mild L sided weakness, decreased balance, decreased safety and I with all functional mobility. She also has a L visual field cut, and chronic B knee pain. She will benefit from PT to address her deficits in order to to improve her safety and I with all mobility, return to her normal daily activities and independence.  OBJECTIVE IMPAIRMENTS: decreased activity tolerance, decreased balance, decreased coordination, decreased endurance, decreased mobility, difficulty walking, decreased strength, and postural dysfunction.   ACTIVITY LIMITATIONS: standing, squatting, stairs, and locomotion level  PARTICIPATION LIMITATIONS: meal prep, cleaning, laundry, driving, shopping, and community activity  PERSONAL FACTORS: Past/current experiences are also affecting patient's functional outcome.   REHAB POTENTIAL: Good  CLINICAL DECISION MAKING: Stable/uncomplicated  EVALUATION COMPLEXITY: Moderate  PLAN:  PT FREQUENCY: 2x/week  PT DURATION: 10 weeks  PLANNED INTERVENTIONS: 97110-Therapeutic exercises, 97530- Therapeutic activity, 97112- Neuromuscular re-education, 97535- Self Care, [id number]- Manual therapy, [phone]- Gait training, [phone]- Electrical stimulation (unattended), Patient/Family education, Balance training, Stair training, and Joint mobilization  PLAN FOR NEXT SESSION: Review HEP/update PRN, progress strength and balance, NMR   Oley Balm DPT 08/31/23 9:29 AM

## 2023-09-03 ENCOUNTER — Other Ambulatory Visit: Payer: Self-pay | Admitting: Student

## 2023-09-03 NOTE — Progress Notes (Signed)
Internal Medicine Clinic Attending  Case discussed with the resident at the time of the visit.  We reviewed the resident's history and exam and pertinent patient test results.  I agree with the assessment, diagnosis, and plan of care documented in the resident's note.

## 2023-09-04 NOTE — Telephone Encounter (Signed)
Please call this pt back when you get a chance about her medications .  Per the patient she states that during her office visit 08/22/2023 Dr. Justin Mend stated you would reach out to the patient about her cos of her medications.

## 2023-09-04 NOTE — Telephone Encounter (Signed)
Returned patients call. Discussed the copays that were given to me from the pharmacy on the meds. Suggested patient reach out to pharmacy to get a printout of how much her meds were, being that a friend picked them up for her and said the $600 was her total. Patient also hasn't checked her bank account to see if this amount has been taken out. Also suggested patient reach out to pharmacy regarding any further refills since she will be due soon.

## 2023-09-05 ENCOUNTER — Ambulatory Visit: Payer: Commercial Managed Care - PPO | Admitting: Physical Therapy

## 2023-09-05 ENCOUNTER — Encounter: Payer: Self-pay | Admitting: Physical Therapy

## 2023-09-05 ENCOUNTER — Ambulatory Visit: Payer: Commercial Managed Care - PPO | Admitting: Occupational Therapy

## 2023-09-05 DIAGNOSIS — M6281 Muscle weakness (generalized): Secondary | ICD-10-CM

## 2023-09-05 DIAGNOSIS — R278 Other lack of coordination: Secondary | ICD-10-CM

## 2023-09-05 DIAGNOSIS — R41844 Frontal lobe and executive function deficit: Secondary | ICD-10-CM

## 2023-09-05 DIAGNOSIS — R262 Difficulty in walking, not elsewhere classified: Secondary | ICD-10-CM | POA: Diagnosis not present

## 2023-09-05 DIAGNOSIS — I639 Cerebral infarction, unspecified: Secondary | ICD-10-CM

## 2023-09-05 DIAGNOSIS — R41842 Visuospatial deficit: Secondary | ICD-10-CM

## 2023-09-05 DIAGNOSIS — R2681 Unsteadiness on feet: Secondary | ICD-10-CM

## 2023-09-05 DIAGNOSIS — R41840 Attention and concentration deficit: Secondary | ICD-10-CM
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
## 2023-09-05 NOTE — Therapy (Signed)
OUTPATIENT PHYSICAL THERAPY NEURO EVALUATION   Patient Name: Maria Rose MRN: [id number] DOB:[birth year], 68 y.o., female Today's Date: 08/15/2023  PCP: N/A REFERRING PROVIDER: Mercie Eon, MD  END OF SESSION:  PT End of Session - 08/15/23 1503     Visit Number 5   Date for PT Re-Evaluation 10/10/23    PT Start Time 1015    PT Stop Time 1100    PT Time Calculation (min) 45 min    Activity Tolerance Patient tolerated treatment well    Behavior During Therapy [facility] for tasks assessed/performed            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   Past Surgical History:  Procedure Laterality Date   ESOPHAGOGASTRODUODENOSCOPY (EGD) WITH PROPOFOL N/A 07/31/2023   Procedure: ESOPHAGOGASTRODUODENOSCOPY (EGD) WITH PROPOFOL;  Surgeon: Jenel Lucks, MD;  Location: [facility] ENDOSCOPY;  Service: Gastroenterology;  Laterality: N/A;   HEMOSTASIS CONTROL  07/31/2023   Procedure: HEMOSTASIS CONTROL;  Surgeon: Jenel Lucks, MD;  Location: [facility] ENDOSCOPY;  Service: Gastroenterology;;   IR ANGIOGRAM VISCERAL SELECTIVE  08/07/2023   IR ANGIOGRAM VISCERAL SELECTIVE  08/07/2023   IR EMBO VENOUS NOT HEMORR HEMANG  INC GUIDE ROADMAPPING  08/05/2023   IR RENAL SELECTIVE  UNI INC S&I MOD SED  08/07/2023   IR US GUIDE VASC ACCESS RIGHT  08/05/2023   RADIOLOGY WITH ANESTHESIA N/A 08/05/2023   Procedure: IR WITH ANESTHESIA -TIPS;  Surgeon: Radiologist, Medication, MD;  Location: [facility] OR;  Service: Radiology;  Laterality: N/A;   Patient Active Problem List   Diagnosis Date Noted   Hepatic cirrhosis (HCC) 08/14/2023   Other cirrhosis of liver (HCC) 08/11/2023   Iron deficiency anemia 08/11/2023   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   Decompensated cirrhosis (HCC) 07/31/2023   Bleeding gastric varices 07/31/2023   Cerebrovascular accident (CVA) (HCC)  07/31/2023   Type 2 diabetes mellitus (HCC) 07/30/2023    ONSET DATE: 08/12/23  REFERRING DIAG: I63.9 (ICD-10-CM) - Stroke (HCC)   THERAPY DIAG:  Difficulty in walking, not elsewhere classified - Plan: PT plan of care cert/re-cert  Muscle weakness (generalized) - Plan: PT plan of care cert/re-cert  Other abnormalities of gait and mobility - Plan: PT plan of care cert/re-cert  Other lack of coordination - Plan: PT plan of care cert/re-cert  Unsteadiness on feet - Plan: PT plan of care cert/re-cert  Cerebrovascular accident (CVA), unspecified mechanism (HCC) - Plan: PT plan of care cert/re-cert  Rationale for Evaluation and Treatment: Rehabilitation  SUBJECTIVE:  SUBJECTIVE STATEMENT: Pt reports no new issues and feel she is slowly improving, HEP going well. She feels like the exercises the last couple of PT sessions have helped her knee.    PERTINENT HISTORY:  Per hospital PT evaluation note: 68 y.o. female presents to [facility] hospital on 07/30/2023 after a fall and shaking episode. 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 Additional punctate acute infarcts in right cerebellum, left temporal lobe, left occipital lobe, right occipital lobe, right parietal lobe, right frontal lobe, right thalamus, left frontal lobe. Pt with bloody bowel movement and hematemesis on 12/24. PMH includes hepatic cirrhosis, gastric and splenic varices, sepsis.   PAIN:  Are you having pain? Yes: NPRS scale: 6/10 Pain location: L knee Pain description: Chronic Aggravating factors: Cold weather, too much walking. Relieving factors: Has knee brace which helps, was taking Allieve, but had to stop since stroke.  PRECAUTIONS:  Fall and Other: L visual field cut  RED FLAGS: None   WEIGHT BEARING RESTRICTIONS: No  FALLS: Has patient fallen in last 6 months? Yes. Number of falls 1  LIVING ENVIRONMENT: Lives with: lives with their family Lives in: House/apartment Stairs: Yes: Internal: 18 steps; on left going up and External: 1 steps; none Has following equipment at home: Walker - 2 wheeled  PLOF: Independent  PATIENT GOALS: Patient would like to improve her balance, minimize knee pain, return her normal daily activities.  OBJECTIVE:  Note: Objective measures were completed at Evaluation unless otherwise noted.  DIAGNOSTIC FINDINGS: N/A  COGNITION: Overall cognitive status: Within functional limits for tasks assessed   SENSATION: Patient denies change  COORDINATION: Heel slide on shin, WNL  EDEMA:  Patient denies  MUSCLE TONE: WNL  MUSCLE LENGTH: Hamstrings: Right 75 deg; Left 60 deg Thomas test: WNL B.  POSTURE: increased thoracic kyphosis and weight shift right  LOWER EXTREMITY ROM:   WNL B.  LOWER EXTREMITY MMT:  R 5/5  MMT Right Eval Left Eval  Hip flexion  4-  Hip extension  4-  Hip abduction  4-  Hip adduction    Hip internal rotation    Hip external rotation    Knee flexion  4  Knee extension  4  Ankle dorsiflexion  4  Ankle plantarflexion  4-  Ankle inversion    Ankle eversion    (Blank rows = not tested)  BED MOBILITY:  I  TRANSFERS: I  RAMP:  S  CURB:  CGA  STAIRS: Level of Assistance: SBA Stair Negotiation Technique: Step to Pattern with Single Rail on Left Number of Stairs: 18  Height of Stairs: 6"  Comments: Patient reports she is stable with a rail  GAIT: Gait pattern: step through pattern, decreased arm swing- Right, decreased arm swing- Left, shuffling, and lateral hip instability Distance walked: in clinic distances Assistive device utilized: None Level of assistance: SBA Comments: Patient reports that she walks with her son or husband  when outside of her house for safety.  FUNCTIONAL TESTS:  5 times sit to stand: 18.81 Berg Balance Scale: 39 Functional gait assessment: 16  TREATMENT DATE 09/05/23 NuStep L5 x 6 minutes Standing side to side step x 10 reps, R tband, in parallel bars Standing hip abd, ext, flex x 10 each leg, in parallel bars, R tband at knees Standing march without UE support, required about 50% UE support Standing heel raises at counter, no UE support needed, 2 x 10 reps Tandem stance on airex plank in parallel bars, static, arm swings, weight shifts. Required about 50% UE support, demonstrated excellent balance reactions. Heel raises/drop with toes elevated, with step back for balance. Initially had difficulty controlling. Improved with repetition.   [facility] Adult PT Treatment:                                                DATE: 08/31/23 Therapeutic Exercise: Hands across chest to perform STS from chair x10 with eccentric control improving through session Step ups onto 4" stair with uni UE support with slow eccentric x10 ea  Neuromuscular re-ed: the following were performed in  bars to bring awareness to B hemibody, promoting anticipatory and reactionary balance with occasional visual feedback. Feet hip width eyes closed FTEO, FTEC x20" each Semi tandem B x20" each, able to perform tandem with RLE back with heel and toe side by side Feet hip width eyes closed on airex x20" FTEO on airex Marching on airex x20 Step ups onto airex x15 ea Step ups alt x12 Anterior weight shift with head turns Airex beam tandem with BUE support x5 laps Airex beam side stepping with BUE support x3 laps Tandem and reverse tandem walking x3 laps   08/28/23 NuStep L4 x 6 minutes Tandem stance progression-In parallel bars-Static, arm swing, weight shift forward and back, passing 3# weight side to  side, runners stride, x 10 each with each leg in front. Repeat with feet on airex. Performed alternating march in parallel stance rather than tandem, more unstable. Resisted Dead lifts holding R tband in each hand, under feet, 2 x 10 reps. Side to side stepping in parallel bars, increasing speed with repetitions Repeat on airex plank x 5 each way, not focused on speed. No UE support. Ambulation, 1 x 300', mildly SOB, but no unsteadiness or pain noted. MH to B knees in sitting x 5 minutes.  08/24/23 - Seated Hip Abduction with Resistance  - 1 x daily - 7 x weekly - 1-2 sets - 10 reps - 3 seconds hold (yellow band) - Sit to Stand  - 1 x daily - 7 x weekly - 1-2 sets - 5-10 reps (holding 2lb ball at chest) - Side Stepping with Counter Support  - 1 x daily - 7 x weekly - 2-3 sets - 10 reps (PT in front of patient without UE support) - Heel Raises with Counter Support  - 1 x daily - 7 x weekly - 1-2 sets - 10 reps - Standing March with Counter Support  - 1 x daily - 7 x weekly - 1-2 sets - 10 reps - Standing Hip Abduction with Counter Support  - 1 x daily - 7 x weekly - 1 sets - 10 reps (mirror feedback at ) - PPT x12 - Bridge 2x10 - lumbar rotations x10  08/20/23 Nustep level 5 x 6 minutes Leg curls 20# 2x10 LAQ 2x10 2.5# - pulls on left knee Step on and off airex In pbars step turn touch In front  of bench feet stationary turn and touch cone to side and behind Then did the same on airex, had a loss of balance and went back onto bench sitting On airex catch and volleyball Turn and toss with feet stationary Walking ball toss  08/15/23 Education    PATIENT EDUCATION: Education details: POC Person educated: Patient Education method: Explanation Education comprehension: verbalized understanding  HOME EXERCISE PROGRAM: Access Code: [email or web address] URL: [email or web address] Date: 08/24/2023 Prepared by: Gardiner Rhyme  Exercises - Seated Hip Abduction with Resistance  - 1 x  daily - 7 x weekly - 1-2 sets - 10 reps - 3 seconds hold - Sit to Stand  - 1 x daily - 7 x weekly - 1-2 sets - 5-10 reps - Side Stepping with Counter Support  - 1 x daily - 7 x weekly - 2-3 sets - 10 reps - Heel Raises with Counter Support  - 1 x daily - 7 x weekly - 1-2 sets - 10 reps - Standing March with Counter Support  - 1 x daily - 7 x weekly - 1-2 sets - 10 reps - Standing Hip Abduction with Counter Support  - 1 x daily - 7 x weekly - 1 sets - 10 reps  GOALS: Goals reviewed with patient? Yes  SHORT TERM GOALS: Target date: 09/04/23  I with initial HEP Baseline: Goal status: 08/28/23-met  LONG TERM GOALS: Target date: 10/10/23  I with final HEP Baseline:  Goal status: INITIAL  2.  Complete 5 x STS without UE support in < 15 sec Baseline: 18.8, used UE Goal status: INITIAL  3.  Improve BERG score to at least 45/56 to indicate minimal fall risk Baseline: 39 Goal status: INITIAL  4.  Improve FGA score to at least 24/28 Baseline: 16 Goal status: INITIAL  5.  Increase L LE strength and trunk strength to at least 4+/5 to provide better support and help decrease knee pain. Baseline:  Goal status: INITIAL  6. Patient will walk at least 500' on level and unlevel surfaces I with no instability noted.  Baseline: Multiple gait abnormalities and limitations noted.  Goal Status: 09/05/23-Improved, but still fatigues and loses her quality of movement. ongoing  ASSESSMENT:  CLINICAL IMPRESSION: Pt continues to show improved strength and balance. Progressed her HEP today, adding resistance to several of her moves and increasing her balance challenges.  Patient is a 68 y.o. who was seen today for physical therapy evaluation and treatment S/P R CVA. She presents with mild L sided weakness, decreased balance, decreased safety and I with all functional mobility. She also has a L visual field cut, and chronic B knee pain. She will benefit from PT to address her deficits in order to to  improve her safety and I with all mobility, return to her normal daily activities and independence.  OBJECTIVE IMPAIRMENTS: decreased activity tolerance, decreased balance, decreased coordination, decreased endurance, decreased mobility, difficulty walking, decreased strength, and postural dysfunction.   ACTIVITY LIMITATIONS: standing, squatting, stairs, and locomotion level  PARTICIPATION LIMITATIONS: meal prep, cleaning, laundry, driving, shopping, and community activity  PERSONAL FACTORS: Past/current experiences are also affecting patient's functional outcome.   REHAB POTENTIAL: Good  CLINICAL DECISION MAKING: Stable/uncomplicated  EVALUATION COMPLEXITY: Moderate  PLAN:  PT FREQUENCY: 2x/week  PT DURATION: 10 weeks  PLANNED INTERVENTIONS: 97110-Therapeutic exercises, 97530- Therapeutic activity, [id number]- Neuromuscular re-education, 97535- Self Care, [id number]- Manual therapy, [id number]- Gait training, [phone]- Electrical stimulation (unattended), Patient/Family education, Balance training, Stair training, and Joint mobilization  PLAN FOR NEXT SESSION:  Review HEP/update PRN, progress strength and balance, NMR   Oley Balm DPT 09/05/23 12:30 PM  09/05/23 12:30 PM

## 2023-09-05 NOTE — Patient Instructions (Signed)
  Coordination Activities  Perform the following activities for 10 minutes 1 times every other day with left hand(s).  Rotate ball in fingertips (clockwise and counter-clockwise). Toss ball between hands. Toss ball in air and catch with the same hand. Flip cards 1 at a time  Deal cards with your thumb (Hold deck in hand and push card off top with thumb). Pick up coins, buttons, marbles, dried beans/pasta of different sizes and place in container. Pick up coins and place in container or coin bank. Pick up coins and stack. Pick up coins one at a time until you get 5-10 in your hand, then move coins from palm to fingertips to stack one at a time. Twirl pen between fingers. Practice writing and/or typing.

## 2023-09-06 ENCOUNTER — Ambulatory Visit: Payer: Commercial Managed Care - PPO | Admitting: Occupational Therapy

## 2023-09-06 ENCOUNTER — Encounter: Payer: Self-pay | Admitting: Physical Therapy

## 2023-09-06 ENCOUNTER — Ambulatory Visit: Payer: Commercial Managed Care - PPO | Admitting: Physical Therapy

## 2023-09-06 DIAGNOSIS — R41844 Frontal lobe and executive function deficit: Secondary | ICD-10-CM

## 2023-09-06 DIAGNOSIS — R41842 Visuospatial deficit: Secondary | ICD-10-CM

## 2023-09-06 DIAGNOSIS — R278 Other lack of coordination: Secondary | ICD-10-CM

## 2023-09-06 DIAGNOSIS — R262 Difficulty in walking, not elsewhere classified: Secondary | ICD-10-CM

## 2023-09-06 DIAGNOSIS — M6281 Muscle weakness (generalized): Secondary | ICD-10-CM

## 2023-09-06 DIAGNOSIS — I639 Cerebral infarction, unspecified: Secondary | ICD-10-CM

## 2023-09-06 DIAGNOSIS — R41840 Attention and concentration deficit: Secondary | ICD-10-CM

## 2023-09-06 DIAGNOSIS — R2681 Unsteadiness on feet: Secondary | ICD-10-CM

## 2023-09-06 NOTE — Patient Instructions (Signed)
Performing Daily Activities with Big Movements  Pick at least one activity a day and perform with BIG, DELIBERATE movements/effort. This can make the activity easier and turn daily activities into exercise!  If you are standing during the activity, make sure to keep feet apart and stand with good/big/PWR! UP posture.  Examples: Dressing - Push arms in sleeves, twist when putting on jacket, push foot into pants, open hands to pull down shirt/put on socks/pull up pants Bathing - Wash/dry with long strokes Brushing your teeth - Big, slow movements Cutting food - Long deliberate cuts Opening jar/bottle - Move as much as you can with each turn Picking up a cup/bottle - Open hand up big and get object all the way in palm Hanging up clothes/getting clothes down from closet - Reach with big effort Putting away groceries/dishes - Reach with big effort Wiping counter/table - Move in big, long strokes Stirring while cooking - Exaggerate movement Cleaning windows - Move in big, long strokes Sweeping - Move arms in big, long strokes Vacuuming - Push with big movement Folding clothes - Exaggerate arm movements Washing car - Move in big, long strokes Raking - Move arms in big, long strokes Changing light bulb - Move as much as you can with each turn Using a screwdriver - Move as much as you can with each turn Walking into a store/restaurant - Walk with big steps, swing arms if able Standing up from a chair/recliner/sofa - Scoot forward, lean forward, and stand with big effort

## 2023-09-06 NOTE — Therapy (Signed)
OUTPATIENT PHYSICAL THERAPY NEURO EVALUATION   Patient Name: Maria Rose MRN: [id number] DOB:1956-07-28, 68 y.o., female Today's Date: 08/15/2023  PCP: N/A REFERRING PROVIDER: Mercie Eon, MD  END OF SESSION:  PT End of Session - 08/15/23 1503     Visit Number 5   Date for PT Re-Evaluation 10/10/23    PT Start Time 1015    PT Stop Time 1100    PT Time Calculation (min) 45 min    Activity Tolerance Patient tolerated treatment well    Behavior During Therapy [facility] for tasks assessed/performed            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   Past Surgical History:  Procedure Laterality Date   ESOPHAGOGASTRODUODENOSCOPY (EGD) WITH PROPOFOL N/A 07/31/2023   Procedure: ESOPHAGOGASTRODUODENOSCOPY (EGD) WITH PROPOFOL;  Surgeon: Jenel Lucks, MD;  Location: [facility] ENDOSCOPY;  Service: Gastroenterology;  Laterality: N/A;   HEMOSTASIS CONTROL  07/31/2023   Procedure: HEMOSTASIS CONTROL;  Surgeon: Jenel Lucks, MD;  Location: [facility] ENDOSCOPY;  Service: Gastroenterology;;   IR ANGIOGRAM VISCERAL SELECTIVE  08/07/2023   IR ANGIOGRAM VISCERAL SELECTIVE  08/07/2023   IR EMBO VENOUS NOT HEMORR HEMANG  INC GUIDE ROADMAPPING  08/05/2023   IR RENAL SELECTIVE  UNI INC S&I MOD SED  08/07/2023   IR US GUIDE VASC ACCESS RIGHT  08/05/2023   RADIOLOGY WITH ANESTHESIA N/A 08/05/2023   Procedure: IR WITH ANESTHESIA -TIPS;  Surgeon: Radiologist, Medication, MD;  Location: [facility] OR;  Service: Radiology;  Laterality: N/A;   Patient Active Problem List   Diagnosis Date Noted   Hepatic cirrhosis (HCC) 08/14/2023   Other cirrhosis of liver (HCC) 08/11/2023   Iron deficiency anemia 08/11/2023   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   Decompensated cirrhosis (HCC) 07/31/2023   Bleeding gastric varices 07/31/2023   Cerebrovascular accident (CVA) (HCC)  07/31/2023   Type 2 diabetes mellitus (HCC) 07/30/2023    ONSET DATE: 08/12/23  REFERRING DIAG: I63.9 (ICD-10-CM) - Stroke (HCC)   THERAPY DIAG:  Difficulty in walking, not elsewhere classified - Plan: PT plan of care cert/re-cert  Muscle weakness (generalized) - Plan: PT plan of care cert/re-cert  Other abnormalities of gait and mobility - Plan: PT plan of care cert/re-cert  Other lack of coordination - Plan: PT plan of care cert/re-cert  Unsteadiness on feet - Plan: PT plan of care cert/re-cert  Cerebrovascular accident (CVA), unspecified mechanism (HCC) - Plan: PT plan of care cert/re-cert  Rationale for Evaluation and Treatment: Rehabilitation  SUBJECTIVE:  SUBJECTIVE STATEMENT: Pt reports that she is a bit tired. She went to the grocery store after treatment yesterday.   PERTINENT HISTORY:  Per hospital PT evaluation note: 68 y.o. female presents to [facility] hospital on 07/30/2023 after a fall and shaking episode. 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 Additional punctate acute infarcts in right cerebellum, left temporal lobe, left occipital lobe, right occipital lobe, right parietal lobe, right frontal lobe, right thalamus, left frontal lobe. Pt with bloody bowel movement and hematemesis on 12/24. PMH includes hepatic cirrhosis, gastric and splenic varices, sepsis.   PAIN:  Are you having pain? Yes: NPRS scale: 6/10 Pain location: L knee Pain description: Chronic Aggravating factors: Cold weather, too much walking. Relieving factors: Has knee brace which helps, was taking Allieve, but had to stop since stroke.  PRECAUTIONS: Fall and Other: L visual field cut  RED FLAGS: None   WEIGHT  BEARING RESTRICTIONS: No  FALLS: Has patient fallen in last 6 months? Yes. Number of falls 1  LIVING ENVIRONMENT: Lives with: lives with their family Lives in: House/apartment Stairs: Yes: Internal: 18 steps; on left going up and External: 1 steps; none Has following equipment at home: Walker - 2 wheeled  PLOF: Independent  PATIENT GOALS: Patient would like to improve her balance, minimize knee pain, return her normal daily activities.  OBJECTIVE:  Note: Objective measures were completed at Evaluation unless otherwise noted.  DIAGNOSTIC FINDINGS: N/A  COGNITION: Overall cognitive status: Within functional limits for tasks assessed   SENSATION: Patient denies change  COORDINATION: Heel slide on shin, WNL  EDEMA:  Patient denies  MUSCLE TONE: WNL  MUSCLE LENGTH: Hamstrings: Right 75 deg; Left 60 deg Thomas test: WNL B.  POSTURE: increased thoracic kyphosis and weight shift right  LOWER EXTREMITY ROM:   WNL B.  LOWER EXTREMITY MMT:  R 5/5  MMT Right Eval Left Eval  Hip flexion  4-  Hip extension  4-  Hip abduction  4-  Hip adduction    Hip internal rotation    Hip external rotation    Knee flexion  4  Knee extension  4  Ankle dorsiflexion  4  Ankle plantarflexion  4-  Ankle inversion    Ankle eversion    (Blank rows = not tested)  BED MOBILITY:  I  TRANSFERS: I  RAMP:  S  CURB:  CGA  STAIRS: Level of Assistance: SBA Stair Negotiation Technique: Step to Pattern with Single Rail on Left Number of Stairs: 18  Height of Stairs: 6"  Comments: Patient reports she is stable with a rail  GAIT: Gait pattern: step through pattern, decreased arm swing- Right, decreased arm swing- Left, shuffling, and lateral hip instability Distance walked: in clinic distances Assistive device utilized: None Level of assistance: SBA Comments: Patient reports that she walks with her son or husband when outside of her house for safety.  FUNCTIONAL TESTS:  5 times  sit to stand: 18.81 Berg Balance Scale: 39 Functional gait assessment: 16  TREATMENT DATE 09/06/23 Bike L3 x 2, then 2 x 30 sec fast pedal. SLS with step taps- Progressed from single taps, to 3 taps, to taps with rotation ( corner to corner), then place one foot on step and rotate to the side and back x 5 to each side., no UE support, mild unsteadiness. Ambulation while throwing and catching a small ball, first to herself, then back and forth with therapist. She dropped ball once and actually took a few quick steps to retrieve the ball, no LOB. Step back with rotation- pick up a cone, step back and turn foot out, then reach behind to place the cone on mat. X 6 reps each way., good stability. Alternating cone taps while standing on black floor mat with side step. Required min a for balance x 3, but improved stability with repetition.  09/05/23 NuStep L5 x 6 minutes Standing side to side step x 10 reps, R tband, in parallel bars Standing hip abd, ext, flex x 10 each leg, in parallel bars, R tband at knees Standing march without UE support, required about 50% UE support Standing heel raises at counter, no UE support needed, 2 x 10 reps Tandem stance on airex plank in parallel bars, static, arm swings, weight shifts. Required about 50% UE support, demonstrated excellent balance reactions. Heel raises/drop with toes elevated, with step back for balance. Initially had difficulty controlling. Improved with repetition.   [facility] Adult PT Treatment:                                                DATE: 08/31/23 Therapeutic Exercise: Hands across chest to perform STS from chair x10 with eccentric control improving through session Step ups onto 4" stair with uni UE support with slow eccentric x10 ea  Neuromuscular re-ed: the following were performed in  bars to bring awareness to B  hemibody, promoting anticipatory and reactionary balance with occasional visual feedback. Feet hip width eyes closed FTEO, FTEC x20" each Semi tandem B x20" each, able to perform tandem with RLE back with heel and toe side by side Feet hip width eyes closed on airex x20" FTEO on airex Marching on airex x20 Step ups onto airex x15 ea Step ups alt x12 Anterior weight shift with head turns Airex beam tandem with BUE support x5 laps Airex beam side stepping with BUE support x3 laps Tandem and reverse tandem walking x3 laps   08/28/23 NuStep L4 x 6 minutes Tandem stance progression-In parallel bars-Static, arm swing, weight shift forward and back, passing 3# weight side to side, runners stride, x 10 each with each leg in front. Repeat with feet on airex. Performed alternating march in parallel stance rather than tandem, more unstable. Resisted Dead lifts holding R tband in each hand, under feet, 2 x 10 reps. Side to side stepping in parallel bars, increasing speed with repetitions Repeat on airex plank x 5 each way, not focused on speed. No UE support. Ambulation, 1 x 300', mildly SOB, but no unsteadiness or pain noted. MH to B knees in sitting x 5 minutes.  08/24/23 - Seated Hip Abduction with Resistance  - 1 x daily - 7 x weekly - 1-2 sets - 10 reps - 3 seconds hold (yellow band) - Sit to Stand  - 1 x daily - 7 x weekly - 1-2 sets - 5-10 reps (holding  2lb ball at chest) - Side Stepping with Counter Support  - 1 x daily - 7 x weekly - 2-3 sets - 10 reps (PT in front of patient without UE support) - Heel Raises with Counter Support  - 1 x daily - 7 x weekly - 1-2 sets - 10 reps - Standing March with Counter Support  - 1 x daily - 7 x weekly - 1-2 sets - 10 reps - Standing Hip Abduction with Counter Support  - 1 x daily - 7 x weekly - 1 sets - 10 reps (mirror feedback at ) - PPT x12 - Bridge 2x10 - lumbar rotations x10  08/20/23 Nustep level 5 x 6 minutes Leg curls 20# 2x10 LAQ 2x10  2.5# - pulls on left knee Step on and off airex In pbars step turn touch In front of bench feet stationary turn and touch cone to side and behind Then did the same on airex, had a loss of balance and went back onto bench sitting On airex catch and volleyball Turn and toss with feet stationary Walking ball toss  08/15/23 Education    PATIENT EDUCATION: Education details: POC Person educated: Patient Education method: Explanation Education comprehension: verbalized understanding  HOME EXERCISE PROGRAM: Access Code: [email or web address] URL: [email or web address] Date: 08/24/2023 Prepared by: Gardiner Rhyme  Exercises - Seated Hip Abduction with Resistance  - 1 x daily - 7 x weekly - 1-2 sets - 10 reps - 3 seconds hold - Sit to Stand  - 1 x daily - 7 x weekly - 1-2 sets - 5-10 reps - Side Stepping with Counter Support  - 1 x daily - 7 x weekly - 2-3 sets - 10 reps - Heel Raises with Counter Support  - 1 x daily - 7 x weekly - 1-2 sets - 10 reps - Standing March with Counter Support  - 1 x daily - 7 x weekly - 1-2 sets - 10 reps - Standing Hip Abduction with Counter Support  - 1 x daily - 7 x weekly - 1 sets - 10 reps  GOALS: Goals reviewed with patient? Yes  SHORT TERM GOALS: Target date: 09/04/23  I with initial HEP Baseline: Goal status: 08/28/23-met  LONG TERM GOALS: Target date: 10/10/23  I with final HEP Baseline:  Goal status: INITIAL  2.  Complete 5 x STS without UE support in < 15 sec Baseline: 18.8, used UE Goal status: 09/06/23 14.25, no hands, much improved balance. Met  3.  Improve BERG score to at least 45/56 to indicate minimal fall risk Baseline: 39 Goal status: INITIAL  4.  Improve FGA score to at least 24/28 Baseline: 16 Goal status: INITIAL  5.  Increase L LE strength and trunk strength to at least 4+/5 to provide better support and help decrease knee pain. Baseline:  Goal status: INITIAL  6. Patient will walk at least 500' on level and unlevel  surfaces I with no instability noted.  Baseline: Multiple gait abnormalities and limitations noted.  Goal Status: 09/05/23-Improved, but still fatigues and loses her quality of movement. ongoing  ASSESSMENT:  CLINICAL IMPRESSION: Pt progressing with her balance, tolerated increasingly difficult challenges involving speed of movement, SLS, and balance on unsteady surfaces. Demonstrated appropriate balance reactions in response to the challenges, required occasional min A.  Patient is a 68 y.o. who was seen today for physical therapy evaluation and treatment S/P R CVA. She presents with mild L sided weakness, decreased balance, decreased safety and I with all functional mobility. She  also has a L visual field cut, and chronic B knee pain. She will benefit from PT to address her deficits in order to to improve her safety and I with all mobility, return to her normal daily activities and independence.  OBJECTIVE IMPAIRMENTS: decreased activity tolerance, decreased balance, decreased coordination, decreased endurance, decreased mobility, difficulty walking, decreased strength, and postural dysfunction.   ACTIVITY LIMITATIONS: standing, squatting, stairs, and locomotion level  PARTICIPATION LIMITATIONS: meal prep, cleaning, laundry, driving, shopping, and community activity  PERSONAL FACTORS: Past/current experiences are also affecting patient's functional outcome.   REHAB POTENTIAL: Good  CLINICAL DECISION MAKING: Stable/uncomplicated  EVALUATION COMPLEXITY: Moderate  PLAN:  PT FREQUENCY: 2x/week  PT DURATION: 10 weeks  PLANNED INTERVENTIONS: 97110-Therapeutic exercises, 97530- Therapeutic activity, 97112- Neuromuscular re-education, 97535- Self Care, [id number]- Manual therapy, [phone]- Gait training, [phone]- Electrical stimulation (unattended), Patient/Family education, Balance training, Stair training, and Joint mobilization  PLAN FOR NEXT SESSION: Review HEP/update PRN, progress strength and  balance, NMR   Oley Balm DPT 09/06/23 1:56 PM  09/06/23 1:56 PM
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
## 2023-09-11 ENCOUNTER — Ambulatory Visit: Payer: Commercial Managed Care - PPO | Admitting: Speech Pathology

## 2023-09-11 ENCOUNTER — Encounter: Payer: Self-pay | Admitting: Physical Therapy

## 2023-09-11 ENCOUNTER — Ambulatory Visit: Payer: Commercial Managed Care - PPO | Admitting: Occupational Therapy

## 2023-09-11 ENCOUNTER — Ambulatory Visit: Payer: Commercial Managed Care - PPO | Attending: Internal Medicine | Admitting: Physical Therapy

## 2023-09-11 DIAGNOSIS — R2689 Other abnormalities of gait and mobility: Secondary | ICD-10-CM

## 2023-09-11 DIAGNOSIS — M6281 Muscle weakness (generalized): Secondary | ICD-10-CM | POA: Diagnosis present

## 2023-09-11 DIAGNOSIS — R2681 Unsteadiness on feet: Secondary | ICD-10-CM

## 2023-09-11 DIAGNOSIS — R41840 Attention and concentration deficit: Secondary | ICD-10-CM | POA: Insufficient documentation

## 2023-09-11 DIAGNOSIS — I639 Cerebral infarction, unspecified: Secondary | ICD-10-CM | POA: Diagnosis present

## 2023-09-11 DIAGNOSIS — R262 Difficulty in walking, not elsewhere classified: Secondary | ICD-10-CM | POA: Insufficient documentation

## 2023-09-11 DIAGNOSIS — R41842 Visuospatial deficit: Secondary | ICD-10-CM | POA: Insufficient documentation

## 2023-09-11 DIAGNOSIS — R41844 Frontal lobe and executive function deficit: Secondary | ICD-10-CM | POA: Insufficient documentation

## 2023-09-11 DIAGNOSIS — R278 Other lack of coordination: Secondary | ICD-10-CM | POA: Insufficient documentation
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
## 2023-09-11 NOTE — Patient Instructions (Addendum)
  Strengthening: Resisted Flexion   Hold tubing with ___one__ arm(s) at side. Pull forward and up. Move shoulder through pain-free range of motion. Repeat __10__ times per set.  Do _1-2_ sessions per day , every other day   Strengthening: Resisted Extension   Hold tubing in ___one__ hand(s), arm forward. Pull arm back, elbow straight. Repeat _10___ times per set. Do _1-2___ sessions per day, every other day.   Resisted Horizontal Abduction: Bilateral   Sit or stand, tubing in both hands, arms out in front. Keeping arms straight, pinch shoulder blades together and stretch arms out. Repeat _10___ times per set. Do _1-2___ sessions per day, every other day.   Elbow Flexion: Resisted   With tubing held in ___one___ hand(s) and other end secured under foot, curl arm up as far as possible. Repeat _10___ times per set. Do _1-2___ sessions per day, every other day.    Elbow Extension: Resisted   Sit in chair and hold with other hand and ____one___ elbow bent. Straighten elbow. Repeat _10___ times per set.  Do _1-2___ sessions per day, every other day.        Copyright  VHI. All rights reserved.       Memory Compensation Strategies  Use WARM strategy.  W= write it down  A= associate it  R= repeat it  M= make a mental note  2.   You can keep a Glass Blower/designer.  Use a 3-ring notebook with sections for the following: calendar, important names and phone numbers,  medications, doctors' names/phone numbers, lists/reminders, and a section to journal what you did each day.   3.    Use a calendar to write appointments down.  4.    Write yourself a schedule for the day.  This can be placed on the calendar or in a separate section of the Memory Notebook.  Keeping a regular schedule can help memory.  5.    Use medication organizer with sections for each day or morning/evening pills.  You may need help loading it  6.    Keep a basket, or pegboard by the door.  Place  items that you need to take out with you in the basket or on the pegboard.  You may also want to  include a message board for reminders.  7.    Use sticky notes.  Place sticky notes with reminders in a place where the task is performed.  For example:  turn off the  stove placed by the stove, lock the door placed on the door at eye level,  take your medications on the bathroom mirror or by the place where you normally take your medications.  8.    Use alarms/timers.  Use while cooking to remind yourself to check on food or as a reminder to take your medicine, or as a  reminder to make a call, or as a reminder to perform another task, etc.

## 2023-09-11 NOTE — Therapy (Signed)
 OUTPATIENT PHYSICAL THERAPY NEURO TREATMENT    Patient Name: Maria Rose MRN: [id number] DOB:04/27/1956, 68 y.o., female Today's Date: 08/15/2023  PCP: N/A REFERRING PROVIDER: Lovie Clarity, MD      PT End of Session - 09/11/23 1154     Visit Number 8    Date for PT Re-Evaluation 10/10/23    PT Start Time 1147    PT Stop Time 1226    PT Time Calculation (min) 39 min    Activity Tolerance Patient tolerated treatment well    Behavior During Therapy [facility] for tasks assessed/performed                  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   Past Surgical History:  Procedure Laterality Date   ESOPHAGOGASTRODUODENOSCOPY (EGD) WITH PROPOFOL  N/A 07/31/2023   Procedure: ESOPHAGOGASTRODUODENOSCOPY (EGD) WITH PROPOFOL ;  Surgeon: Stacia Glendia BRAVO, MD;  Location: [facility] ENDOSCOPY;  Service: Gastroenterology;  Laterality: N/A;   HEMOSTASIS CONTROL  07/31/2023   Procedure: HEMOSTASIS CONTROL;  Surgeon: Stacia Glendia BRAVO, MD;  Location: [facility] ENDOSCOPY;  Service: Gastroenterology;;   IR ANGIOGRAM VISCERAL SELECTIVE  08/07/2023   IR ANGIOGRAM VISCERAL SELECTIVE  08/07/2023   IR EMBO VENOUS NOT HEMORR HEMANG  INC GUIDE ROADMAPPING  08/05/2023   IR RENAL SELECTIVE  UNI INC S&I MOD SED  08/07/2023   IR US  GUIDE VASC ACCESS RIGHT  08/05/2023   RADIOLOGY WITH ANESTHESIA N/A 08/05/2023   Procedure: IR WITH ANESTHESIA -TIPS;  Surgeon: Radiologist, Medication, MD;  Location: [facility] OR;  Service: Radiology;  Laterality: N/A;   Patient Active Problem List   Diagnosis Date Noted   Hepatic cirrhosis (HCC) 08/14/2023   Other cirrhosis of liver (HCC) 08/11/2023   Iron  deficiency anemia 08/11/2023   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   Decompensated cirrhosis (HCC) 07/31/2023   Bleeding gastric varices 07/31/2023   Cerebrovascular accident (CVA) (HCC)  07/31/2023   Type 2 diabetes mellitus (HCC) 07/30/2023    ONSET DATE: 08/12/23  REFERRING DIAG: I63.9 (ICD-10-CM) - Stroke (HCC)   THERAPY DIAG:  Difficulty in walking, not elsewhere classified - Plan: PT plan of care cert/re-cert  Muscle weakness (generalized) - Plan: PT plan of care cert/re-cert  Other abnormalities of gait and mobility - Plan: PT plan of care cert/re-cert  Other lack of coordination - Plan: PT plan of care cert/re-cert  Unsteadiness on feet - Plan: PT plan of care cert/re-cert  Cerebrovascular accident (CVA), unspecified mechanism (HCC) - Plan: PT plan of care cert/re-cert  Rationale for Evaluation and Treatment: Rehabilitation  SUBJECTIVE:  SUBJECTIVE STATEMENT:  Doing well, would still like to work on strength and balance. No falls since last time.  PERTINENT HISTORY:  Per hospital PT evaluation note: 68 y.o. female presents to [facility] hospital on 07/30/2023 after a fall and shaking episode. 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 Additional punctate acute infarcts in right cerebellum, left temporal lobe, left occipital lobe, right occipital lobe, right parietal lobe, right frontal lobe, right thalamus, left frontal lobe. Pt with bloody bowel movement and hematemesis on 12/24. PMH includes hepatic cirrhosis, gastric and splenic varices, sepsis.   PAIN:  Are you having pain? Yes: NPRS scale: 6/10 today  Pain location: L knee Pain description: Chronic Aggravating factors: Cold weather, too much walking. Relieving factors: Has knee brace which helps, was taking Allieve, but had to stop since stroke.  PRECAUTIONS: Fall and Other: L visual field cut  RED  FLAGS: None   WEIGHT BEARING RESTRICTIONS: No  FALLS: Has patient fallen in last 6 months? Yes. Number of falls 1  LIVING ENVIRONMENT: Lives with: lives with their family Lives in: House/apartment Stairs: Yes: Internal: 18 steps; on left going up and External: 1 steps; none Has following equipment at home: Walker - 2 wheeled  PLOF: Independent  PATIENT GOALS: Patient would like to improve her balance, minimize knee pain, return her normal daily activities.  OBJECTIVE:  Note: Objective measures were completed at Evaluation unless otherwise noted.  DIAGNOSTIC FINDINGS: N/A  COGNITION: Overall cognitive status: Within functional limits for tasks assessed   SENSATION: Patient denies change  COORDINATION: Heel slide on shin, WNL  EDEMA:  Patient denies  MUSCLE TONE: WNL  MUSCLE LENGTH: Hamstrings: Right 75 deg; Left 60 deg Thomas test: WNL B.  POSTURE: increased thoracic kyphosis and weight shift right  LOWER EXTREMITY ROM:   WNL B.  LOWER EXTREMITY MMT:  R 5/5  MMT Right Eval Left Eval  Hip flexion  4-  Hip extension  4-  Hip abduction  4-  Hip adduction    Hip internal rotation    Hip external rotation    Knee flexion  4  Knee extension  4  Ankle dorsiflexion  4  Ankle plantarflexion  4-  Ankle inversion    Ankle eversion    (Blank rows = not tested)  BED MOBILITY:  I  TRANSFERS: I  RAMP:  S  CURB:  CGA  STAIRS: Level of Assistance: SBA Stair Negotiation Technique: Step to Pattern with Single Rail on Left Number of Stairs: 18  Height of Stairs: 6  Comments: Patient reports she is stable with a rail  GAIT: Gait pattern: step through pattern, decreased arm swing- Right, decreased arm swing- Left, shuffling, and lateral hip instability Distance walked: in clinic distances Assistive device utilized: None Level of assistance: SBA Comments: Patient reports that she walks with her son or husband when outside of her house for  safety.  FUNCTIONAL TESTS:  5 times sit to stand: 18.81 Berg Balance Scale: 39 Functional gait assessment: 16  TREATMENT DATE  09/11/23  Nustep L5-6 all four extremities for tissue perfusion, promotion of reciprocal movement patterns Forward tandem walks on foam x4 laps in // bars Lateral stepping on foam x3 laps in // bars Forward step and wt shift onto BOSU x10 B Lateral step and wt shift onto BOSU x10 B  Lateral rocks on rockerboard x2 minutes light BUE support/min guard from PT AP rocks on rockerboard x2 minutes light BUE support/min guard from PT     Bridges + green TB around knees 2x12  Sidelying hip ABD green TB x10 B Mod cues for form/alignment  Walking bridges  x10 no band double steps forward      09/06/23 Bike L3 x 2, then 2 x 30 sec fast pedal. SLS with step taps- Progressed from single taps, to 3 taps, to taps with rotation ( corner to corner), then place one foot on step and rotate to the side and back x 5 to each side., no UE support, mild unsteadiness. Ambulation while throwing and catching a small ball, first to herself, then back and forth with therapist. She dropped ball once and actually took a few quick steps to retrieve the ball, no LOB. Step back with rotation- pick up a cone, step back and turn foot out, then reach behind to place the cone on mat. X 6 reps each way., good stability. Alternating cone taps while standing on black floor mat with side step. Required min a for balance x 3, but improved stability with repetition.  09/05/23 NuStep L5 x 6 minutes Standing side to side step x 10 reps, R tband, in parallel bars Standing hip abd, ext, flex x 10 each leg, in parallel bars, R tband at knees Standing march without UE support, required about 50% UE support Standing heel raises at counter, no UE support needed, 2 x 10 reps Tandem  stance on airex plank in parallel bars, static, arm swings, weight shifts. Required about 50% UE support, demonstrated excellent balance reactions. Heel raises/drop with toes elevated, with step back for balance. Initially had difficulty controlling. Improved with repetition.   [facility] Adult PT Treatment:                                                DATE: 08/31/23 Therapeutic Exercise: Hands across chest to perform STS from chair x10 with eccentric control improving through session Step ups onto 4 stair with uni UE support with slow eccentric x10 ea  Neuromuscular re-ed: the following were performed in  bars to bring awareness to B hemibody, promoting anticipatory and reactionary balance with occasional visual feedback. Feet hip width eyes closed FTEO, FTEC x20 each Semi tandem B x20 each, able to perform tandem with RLE back with heel and toe side by side Feet hip width eyes closed on airex x20 FTEO on airex Marching on airex x20 Step ups onto airex x15 ea Step ups alt x12 Anterior weight shift with head turns Airex beam tandem with BUE support x5 laps Airex beam side stepping with BUE support x3 laps Tandem and reverse tandem walking x3 laps   08/28/23 NuStep L4 x 6 minutes Tandem stance progression-In parallel bars-Static, arm swing, weight shift forward and back, passing 3# weight side to side, runners stride, x 10 each with each leg in front. Repeat with feet on airex. Performed alternating march in parallel stance  rather than tandem, more unstable. Resisted Dead lifts holding R tband in each hand, under feet, 2 x 10 reps. Side to side stepping in parallel bars, increasing speed with repetitions Repeat on airex plank x 5 each way, not focused on speed. No UE support. Ambulation, 1 x 300', mildly SOB, but no unsteadiness or pain noted. MH to B knees in sitting x 5 minutes.  08/24/23 - Seated Hip Abduction with Resistance  - 1 x daily - 7 x weekly - 1-2 sets - 10 reps - 3  seconds hold (yellow band) - Sit to Stand  - 1 x daily - 7 x weekly - 1-2 sets - 5-10 reps (holding 2lb ball at chest) - Side Stepping with Counter Support  - 1 x daily - 7 x weekly - 2-3 sets - 10 reps (PT in front of patient without UE support) - Heel Raises with Counter Support  - 1 x daily - 7 x weekly - 1-2 sets - 10 reps - Standing March with Counter Support  - 1 x daily - 7 x weekly - 1-2 sets - 10 reps - Standing Hip Abduction with Counter Support  - 1 x daily - 7 x weekly - 1 sets - 10 reps (mirror feedback at ) - PPT x12 - Bridge 2x10 - lumbar rotations x10  08/20/23 Nustep level 5 x 6 minutes Leg curls 20# 2x10 LAQ 2x10 2.5# - pulls on left knee Step on and off airex In pbars step turn touch In front of bench feet stationary turn and touch cone to side and behind Then did the same on airex, had a loss of balance and went back onto bench sitting On airex catch and volleyball Turn and toss with feet stationary Walking ball toss  08/15/23 Education    PATIENT EDUCATION: Education details: POC Person educated: Patient Education method: Explanation Education comprehension: verbalized understanding  HOME EXERCISE PROGRAM: Access Code: [email or web address] URL: [email or web address] Date: 08/24/2023 Prepared by: Izetta Fordyce  Exercises - Seated Hip Abduction with Resistance  - 1 x daily - 7 x weekly - 1-2 sets - 10 reps - 3 seconds hold - Sit to Stand  - 1 x daily - 7 x weekly - 1-2 sets - 5-10 reps - Side Stepping with Counter Support  - 1 x daily - 7 x weekly - 2-3 sets - 10 reps - Heel Raises with Counter Support  - 1 x daily - 7 x weekly - 1-2 sets - 10 reps - Standing March with Counter Support  - 1 x daily - 7 x weekly - 1-2 sets - 10 reps - Standing Hip Abduction with Counter Support  - 1 x daily - 7 x weekly - 1 sets - 10 reps  GOALS: Goals reviewed with patient? Yes  SHORT TERM GOALS: Target date: 09/04/23  I with initial HEP Baseline: Goal status:  08/28/23-met  LONG TERM GOALS: Target date: 10/10/23  I with final HEP Baseline:  Goal status: INITIAL  2.  Complete 5 x STS without UE support in < 15 sec Baseline: 18.8, used UE Goal status: 09/06/23 14.25, no hands, much improved balance. Met  3.  Improve BERG score to at least 45/56 to indicate minimal fall risk Baseline: 39 Goal status: INITIAL  4.  Improve FGA score to at least 24/28 Baseline: 16 Goal status: INITIAL  5.  Increase L LE strength and trunk strength to at least 4+/5 to provide better support and help decrease knee pain. Baseline:  Goal  status: INITIAL  6. Patient will walk at least 500' on level and unlevel surfaces I with no instability noted.  Baseline: Multiple gait abnormalities and limitations noted.  Goal Status: 09/05/23-Improved, but still fatigues and loses her quality of movement. ongoing  ASSESSMENT:  CLINICAL IMPRESSION:  Pt arrives today doing well, still having trouble with some L knee pain due to OA. Continued working on functional balance and strength today to tolerance, really seems to be progressing well. Will continue to update and progress her program as appropriate.   Patient is a 68 y.o. who was seen today for physical therapy evaluation and treatment S/P R CVA. She presents with mild L sided weakness, decreased balance, decreased safety and I with all functional mobility. She also has a L visual field cut, and chronic B knee pain. She will benefit from PT to address her deficits in order to to improve her safety and I with all mobility, return to her normal daily activities and independence.  OBJECTIVE IMPAIRMENTS: decreased activity tolerance, decreased balance, decreased coordination, decreased endurance, decreased mobility, difficulty walking, decreased strength, and postural dysfunction.   ACTIVITY LIMITATIONS: standing, squatting, stairs, and locomotion level  PARTICIPATION LIMITATIONS: meal prep, cleaning, laundry, driving, shopping,  and community activity  PERSONAL FACTORS: Past/current experiences are also affecting patient's functional outcome.   REHAB POTENTIAL: Good  CLINICAL DECISION MAKING: Stable/uncomplicated  EVALUATION COMPLEXITY: Moderate  PLAN:  PT FREQUENCY: 2x/week  PT DURATION: 10 weeks  PLANNED INTERVENTIONS: 97110-Therapeutic exercises, 97530- Therapeutic activity, 97112- Neuromuscular re-education, 97535- Self Care, [id number]- Manual therapy, [phone]- Gait training, [phone]- Electrical stimulation (unattended), Patient/Family education, Balance training, Stair training, and Joint mobilization  PLAN FOR NEXT SESSION: Review HEP/update PRN, progress strength and balance, NMR and balance challenges over unsteady surfaces   Josette Rough, PT, DPT 09/11/23 12:29 PM

## 2023-09-14 ENCOUNTER — Other Ambulatory Visit: Payer: Self-pay | Admitting: Student

## 2023-09-18 ENCOUNTER — Ambulatory Visit: Payer: Commercial Managed Care - PPO | Admitting: Occupational Therapy

## 2023-09-18 ENCOUNTER — Ambulatory Visit: Payer: Commercial Managed Care - PPO | Admitting: Physical Therapy

## 2023-09-18 ENCOUNTER — Encounter: Payer: Self-pay | Admitting: Occupational Therapy

## 2023-09-18 ENCOUNTER — Encounter: Payer: Self-pay | Admitting: Physical Therapy

## 2023-09-18 DIAGNOSIS — R41844 Frontal lobe and executive function deficit: Secondary | ICD-10-CM

## 2023-09-18 DIAGNOSIS — R278 Other lack of coordination: Secondary | ICD-10-CM

## 2023-09-18 DIAGNOSIS — R262 Difficulty in walking, not elsewhere classified: Secondary | ICD-10-CM | POA: Diagnosis not present

## 2023-09-18 DIAGNOSIS — M6281 Muscle weakness (generalized): Secondary | ICD-10-CM

## 2023-09-18 DIAGNOSIS — R2681 Unsteadiness on feet: Secondary | ICD-10-CM

## 2023-09-18 DIAGNOSIS — R41840 Attention and concentration deficit: Secondary | ICD-10-CM

## 2023-09-18 DIAGNOSIS — R41842 Visuospatial deficit: Secondary | ICD-10-CM
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
## 2023-09-18 NOTE — Therapy (Signed)
OUTPATIENT PHYSICAL THERAPY NEURO TREATMENT    Patient Name: DONICE ALPERIN MRN: [id number] DOB:09/06/1955, 68 y.o., female Today's Date: 08/15/2023  PCP: N/A REFERRING PROVIDER: Mercie Eon, MD      PT End of Session - 09/18/23 1354     Visit Number 9    Date for PT Re-Evaluation 10/10/23    PT Start Time 1347    PT Stop Time 1426    PT Time Calculation (min) 39 min    Activity Tolerance Patient tolerated treatment well    Behavior During Therapy 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   Past Surgical History:  Procedure Laterality Date   ESOPHAGOGASTRODUODENOSCOPY (EGD) WITH PROPOFOL N/A 07/31/2023   Procedure: ESOPHAGOGASTRODUODENOSCOPY (EGD) WITH PROPOFOL;  Surgeon: Jenel Lucks, MD;  Location: [facility] ENDOSCOPY;  Service: Gastroenterology;  Laterality: N/A;   HEMOSTASIS CONTROL  07/31/2023   Procedure: HEMOSTASIS CONTROL;  Surgeon: Jenel Lucks, MD;  Location: [facility] ENDOSCOPY;  Service: Gastroenterology;;   IR ANGIOGRAM VISCERAL SELECTIVE  08/07/2023   IR ANGIOGRAM VISCERAL SELECTIVE  08/07/2023   IR EMBO VENOUS NOT HEMORR HEMANG  INC GUIDE ROADMAPPING  08/05/2023   IR RENAL SELECTIVE  UNI INC S&I MOD SED  08/07/2023   IR US GUIDE VASC ACCESS RIGHT  08/05/2023   RADIOLOGY WITH ANESTHESIA N/A 08/05/2023   Procedure: IR WITH ANESTHESIA -TIPS;  Surgeon: Radiologist, Medication, MD;  Location: [facility] OR;  Service: Radiology;  Laterality: N/A;   Patient Active Problem List   Diagnosis Date Noted   Hepatic cirrhosis (HCC) 08/14/2023   Other cirrhosis of liver (HCC) 08/11/2023   Iron deficiency anemia 08/11/2023   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   Decompensated cirrhosis (HCC) 07/31/2023   Bleeding gastric varices 07/31/2023   Cerebrovascular accident (CVA) (HCC)  07/31/2023   Type 2 diabetes mellitus (HCC) 07/30/2023    ONSET DATE: 08/12/23  REFERRING DIAG: I63.9 (ICD-10-CM) - Stroke (HCC)   THERAPY DIAG:  Difficulty in walking, not elsewhere classified - Plan: PT plan of care cert/re-cert  Muscle weakness (generalized) - Plan: PT plan of care cert/re-cert  Other abnormalities of gait and mobility - Plan: PT plan of care cert/re-cert  Other lack of coordination - Plan: PT plan of care cert/re-cert  Unsteadiness on feet - Plan: PT plan of care cert/re-cert  Cerebrovascular accident (CVA), unspecified mechanism (HCC) - Plan: PT plan of care cert/re-cert  Rationale for Evaluation and Treatment: Rehabilitation  SUBJECTIVE:  SUBJECTIVE STATEMENT:  Doing well, nothing new since last time   PERTINENT HISTORY:  Per hospital PT evaluation note: 68 y.o. female [facility] to [facility] hospital on 07/30/2023 after a fall and shaking episode. 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 Additional punctate acute infarcts in right cerebellum, left temporal lobe, left occipital lobe, right occipital lobe, right parietal lobe, right frontal lobe, right thalamus, left frontal lobe. Pt with bloody bowel movement and hematemesis on 12/24. PMH includes hepatic cirrhosis, gastric and splenic varices, sepsis.   PAIN:  Are you having pain? Yes: NPRS scale: 6/10  Pain location: L knee Pain description: Chronic Aggravating factors: Cold weather, too much walking. Relieving factors: Has knee brace which helps, was taking Allieve, but had to stop since stroke.  PRECAUTIONS: Fall and Other: L visual field cut  RED FLAGS: None   WEIGHT BEARING RESTRICTIONS: No  FALLS: Has patient  fallen in last 6 months? Yes. Number of falls 1  LIVING ENVIRONMENT: Lives with: lives with their family Lives in: House/apartment Stairs: Yes: Internal: 18 steps; on left going up and External: 1 steps; none Has following equipment at home: Walker - 2 wheeled  PLOF: Independent  PATIENT GOALS: Patient would like to improve her balance, minimize knee pain, return her normal daily activities.  OBJECTIVE:  Note: Objective measures were completed at Evaluation unless otherwise noted.  DIAGNOSTIC FINDINGS: N/A  COGNITION: Overall cognitive status: Within functional limits for tasks assessed   SENSATION: Patient denies change  COORDINATION: Heel slide on shin, WNL  EDEMA:  Patient denies  MUSCLE TONE: WNL  MUSCLE LENGTH: Hamstrings: Right 75 deg; Left 60 deg Thomas test: WNL B.  POSTURE: increased thoracic kyphosis and weight shift right  LOWER EXTREMITY ROM:   WNL B.  LOWER EXTREMITY MMT:  R 5/5  MMT Right Eval Left Eval  Hip flexion  4-  Hip extension  4-  Hip abduction  4-  Hip adduction    Hip internal rotation    Hip external rotation    Knee flexion  4  Knee extension  4  Ankle dorsiflexion  4  Ankle plantarflexion  4-  Ankle inversion    Ankle eversion    (Blank rows = not tested)  BED MOBILITY:  I  TRANSFERS: I  RAMP:  S  CURB:  CGA  STAIRS: Level of Assistance: SBA Stair Negotiation Technique: Step to Pattern with Single Rail on Left Number of Stairs: 18  Height of Stairs: 6"  Comments: Patient reports she is stable with a rail  GAIT: Gait pattern: step through pattern, decreased arm swing- Right, decreased arm swing- Left, shuffling, and lateral hip instability Distance walked: in clinic distances Assistive device utilized: None Level of assistance: SBA Comments: Patient reports that she walks with her son or husband when outside of her house for safety.  FUNCTIONAL TESTS:  5 times sit to stand: 18.81 Berg Balance Scale:  39 Functional gait assessment: 16  TREATMENT DATE   09/18/23  Nustep L5x8 minutes all four extremities for w/u, tissue perfusion STS red TB around knees x10  Forward step ups 4 inch step x10 B Lateral step ups 4 inch step x10 B    Tandem stance blue foam pad 3x30 seconds  Forward alternating toe taps to targets while standing on blue foam pad x20 Cross midline toe taps to targets while standing on blue foam pad x20  Standing on blue air pads shoulder width apart 3x30 seconds    Sidelying hip ABD red TB 2x12 B Supine marches red TB 2x12 B      09/11/23  Nustep L5-6 all four extremities for tissue perfusion, promotion of reciprocal movement patterns Forward tandem walks on foam x4 laps in // bars Lateral stepping on foam x3 laps in // bars Forward step and wt shift onto BOSU x10 B Lateral step and wt shift onto BOSU x10 B  Lateral rocks on rockerboard x2 minutes light BUE support/min guard from PT AP rocks on rockerboard x2 minutes light BUE support/min guard from PT     Bridges + green TB around knees 2x12  Sidelying hip ABD green TB x10 B Mod cues for form/alignment  Walking bridges  x10 no band double steps forward      09/06/23 Bike L3 x 2, then 2 x 30 sec fast pedal. SLS with step taps- Progressed from single taps, to 3 taps, to taps with rotation ( corner to corner), then place one foot on step and rotate to the side and back x 5 to each side., no UE support, mild unsteadiness. Ambulation while throwing and catching a small ball, first to herself, then back and forth with therapist. She dropped ball once and actually took a few quick steps to retrieve the ball, no LOB. Step back with rotation- pick up a cone, step back and turn foot out, then reach behind to place the cone on mat. X 6 reps each way., good stability. Alternating cone taps  while standing on black floor mat with side step. Required min a for balance x 3, but improved stability with repetition.  09/05/23 NuStep L5 x 6 minutes Standing side to side step x 10 reps, R tband, in parallel bars Standing hip abd, ext, flex x 10 each leg, in parallel bars, R tband at knees Standing march without UE support, required about 50% UE support Standing heel raises at counter, no UE support needed, 2 x 10 reps Tandem stance on airex plank in parallel bars, static, arm swings, weight shifts. Required about 50% UE support, demonstrated excellent balance reactions. Heel raises/drop with toes elevated, with step back for balance. Initially had difficulty controlling. Improved with repetition.   [facility] Adult PT Treatment:                                                DATE: 08/31/23 Therapeutic Exercise: Hands across chest to perform STS from chair x10 with eccentric control improving through session Step ups onto 4" stair with uni UE support with slow eccentric x10 ea  Neuromuscular re-ed: the following were performed in  bars to bring awareness to B hemibody, promoting anticipatory and reactionary balance with occasional visual feedback. Feet hip width eyes closed FTEO, FTEC x20" each Semi tandem B x20" each, able to perform tandem with RLE back with heel and toe  side by side Feet hip width eyes closed on airex x20" FTEO on airex Marching on airex x20 Step ups onto airex x15 ea Step ups alt x12 Anterior weight shift with head turns Airex beam tandem with BUE support x5 laps Airex beam side stepping with BUE support x3 laps Tandem and reverse tandem walking x3 laps   08/28/23 NuStep L4 x 6 minutes Tandem stance progression-In parallel bars-Static, arm swing, weight shift forward and back, passing 3# weight side to side, runners stride, x 10 each with each leg in front. Repeat with feet on airex. Performed alternating march in parallel stance rather than tandem, more  unstable. Resisted Dead lifts holding R tband in each hand, under feet, 2 x 10 reps. Side to side stepping in parallel bars, increasing speed with repetitions Repeat on airex plank x 5 each way, not focused on speed. No UE support. Ambulation, 1 x 300', mildly SOB, but no unsteadiness or pain noted. MH to B knees in sitting x 5 minutes.  08/24/23 - Seated Hip Abduction with Resistance  - 1 x daily - 7 x weekly - 1-2 sets - 10 reps - 3 seconds hold (yellow band) - Sit to Stand  - 1 x daily - 7 x weekly - 1-2 sets - 5-10 reps (holding 2lb ball at chest) - Side Stepping with Counter Support  - 1 x daily - 7 x weekly - 2-3 sets - 10 reps (PT in front of patient without UE support) - Heel Raises with Counter Support  - 1 x daily - 7 x weekly - 1-2 sets - 10 reps - Standing March with Counter Support  - 1 x daily - 7 x weekly - 1-2 sets - 10 reps - Standing Hip Abduction with Counter Support  - 1 x daily - 7 x weekly - 1 sets - 10 reps (mirror feedback at ) - PPT x12 - Bridge 2x10 - lumbar rotations x10  08/20/23 Nustep level 5 x 6 minutes Leg curls 20# 2x10 LAQ 2x10 2.5# - pulls on left knee Step on and off airex In pbars step turn touch In front of bench feet stationary turn and touch cone to side and behind Then did the same on airex, had a loss of balance and went back onto bench sitting On airex catch and volleyball Turn and toss with feet stationary Walking ball toss  08/15/23 Education    PATIENT EDUCATION: Education details: POC Person educated: Patient Education method: Explanation Education comprehension: verbalized understanding  HOME EXERCISE PROGRAM: Access Code: [email or web address] URL: [email or web address] Date: 08/24/2023 Prepared by: Gardiner Rhyme  Exercises - Seated Hip Abduction with Resistance  - 1 x daily - 7 x weekly - 1-2 sets - 10 reps - 3 seconds hold - Sit to Stand  - 1 x daily - 7 x weekly - 1-2 sets - 5-10 reps - Side Stepping with Counter Support   - 1 x daily - 7 x weekly - 2-3 sets - 10 reps - Heel Raises with Counter Support  - 1 x daily - 7 x weekly - 1-2 sets - 10 reps - Standing March with Counter Support  - 1 x daily - 7 x weekly - 1-2 sets - 10 reps - Standing Hip Abduction with Counter Support  - 1 x daily - 7 x weekly - 1 sets - 10 reps  GOALS: Goals reviewed with patient? Yes  SHORT TERM GOALS: Target date: 09/04/23  I with initial HEP Baseline: Goal status: 08/28/23-met  LONG TERM GOALS: Target date: 10/10/23  I with final HEP Baseline:  Goal status: INITIAL  2.  Complete 5 x STS without UE support in < 15 sec Baseline: 18.8, used UE Goal status: 09/06/23 14.25, no hands, much improved balance. Met  3.  Improve BERG score to at least 45/56 to indicate minimal fall risk Baseline: 39 Goal status: INITIAL  4.  Improve FGA score to at least 24/28 Baseline: 16 Goal status: INITIAL  5.  Increase L LE strength and trunk strength to at least 4+/5 to provide better support and help decrease knee pain. Baseline:  Goal status: INITIAL  6. Patient will walk at least 500' on level and unlevel surfaces I with no instability noted.  Baseline: Multiple gait abnormalities and limitations noted.  Goal Status: 09/05/23-Improved, but still fatigues and loses her quality of movement. ongoing  ASSESSMENT:  CLINICAL IMPRESSION:   Pt arrives doing OK, had quite a bit of mm soreness after bridges last time. Tells me that her balance is better, would like to focus on strength today- we did so as tolerated but still worked on a bit of balance as time allowed. One of her biggest concerns right now is functional activity tolerance, vision is improving.   OBJECTIVE IMPAIRMENTS: decreased activity tolerance, decreased balance, decreased coordination, decreased endurance, decreased mobility, difficulty walking, decreased strength, and postural dysfunction.   ACTIVITY LIMITATIONS: standing, squatting, stairs, and locomotion  level  PARTICIPATION LIMITATIONS: meal prep, cleaning, laundry, driving, shopping, and community activity  PERSONAL FACTORS: Past/current experiences are also affecting patient's functional outcome.   REHAB POTENTIAL: Good  CLINICAL DECISION MAKING: Stable/uncomplicated  EVALUATION COMPLEXITY: Moderate  PLAN:  PT FREQUENCY: 2x/week  PT DURATION: 10 weeks  PLANNED INTERVENTIONS: 97110-Therapeutic exercises, 97530- Therapeutic activity, [id number]- Neuromuscular re-education, 97535- Self Care, [id number]- Manual therapy, [phone]- Gait training, [phone]- Electrical stimulation (unattended), Patient/Family education, Balance training, Stair training, and Joint mobilization  PLAN FOR NEXT SESSION: Review HEP/update PRN, progress strength and balance, NMR and balance challenges over unsteady surfaces. 10th visit progress note next session?  Nedra Hai, PT, DPT 09/18/23 2:28 PM

## 2023-09-20 ENCOUNTER — Ambulatory Visit: Payer: Commercial Managed Care - PPO | Admitting: Physical Therapy

## 2023-09-20 ENCOUNTER — Encounter: Payer: Self-pay | Admitting: Physical Therapy

## 2023-09-20 ENCOUNTER — Ambulatory Visit: Payer: Commercial Managed Care - PPO | Admitting: Occupational Therapy

## 2023-09-20 DIAGNOSIS — I639 Cerebral infarction, unspecified: Secondary | ICD-10-CM

## 2023-09-20 DIAGNOSIS — R2681 Unsteadiness on feet: Secondary | ICD-10-CM

## 2023-09-20 DIAGNOSIS — R278 Other lack of coordination: Secondary | ICD-10-CM

## 2023-09-20 DIAGNOSIS — R2689 Other abnormalities of gait and mobility: Secondary | ICD-10-CM

## 2023-09-20 DIAGNOSIS — R41844 Frontal lobe and executive function deficit: Secondary | ICD-10-CM

## 2023-09-20 DIAGNOSIS — R262 Difficulty in walking, not elsewhere classified: Secondary | ICD-10-CM

## 2023-09-20 DIAGNOSIS — M6281 Muscle weakness (generalized): Secondary | ICD-10-CM

## 2023-09-20 DIAGNOSIS — R41840 Attention and concentration deficit: Secondary | ICD-10-CM

## 2023-09-20 DIAGNOSIS — R41842 Visuospatial deficit: Secondary | ICD-10-CM
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
## 2023-09-20 NOTE — Therapy (Signed)
OUTPATIENT PHYSICAL THERAPY NEURO TREATMENT  Progress Note Reporting Period 08/15/23 to 09/20/23  See note below for Objective Data and Assessment of Progress/Goals.      Patient Name: Maria Rose MRN: [id number] DOB:March 15, 1956, 68 y.o., female Today's Date: 08/15/2023  PCP: N/A REFERRING PROVIDER: Mercie Eon, MD      PT End of Session - 09/20/23 0841     Visit Number 10    Date for PT Re-Evaluation 10/10/23    Authorization Type [employer] [facility]    PT Start Time 0843    PT Stop Time 0928    PT Time Calculation (min) 45 min    Activity Tolerance Patient tolerated treatment well    Behavior During Therapy [facility] for tasks assessed/performed                   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   Past Surgical History:  Procedure Laterality Date   ESOPHAGOGASTRODUODENOSCOPY (EGD) WITH PROPOFOL N/A 07/31/2023   Procedure: ESOPHAGOGASTRODUODENOSCOPY (EGD) WITH PROPOFOL;  Surgeon: Jenel Lucks, MD;  Location: [facility] ENDOSCOPY;  Service: Gastroenterology;  Laterality: N/A;   HEMOSTASIS CONTROL  07/31/2023   Procedure: HEMOSTASIS CONTROL;  Surgeon: Jenel Lucks, MD;  Location: [facility] ENDOSCOPY;  Service: Gastroenterology;;   IR ANGIOGRAM VISCERAL SELECTIVE  08/07/2023   IR ANGIOGRAM VISCERAL SELECTIVE  08/07/2023   IR EMBO VENOUS NOT HEMORR HEMANG  INC GUIDE ROADMAPPING  08/05/2023   IR RENAL SELECTIVE  UNI INC S&I MOD SED  08/07/2023   IR US GUIDE VASC ACCESS RIGHT  08/05/2023   RADIOLOGY WITH ANESTHESIA N/A 08/05/2023   Procedure: IR WITH ANESTHESIA -TIPS;  Surgeon: Radiologist, Medication, MD;  Location: [facility] OR;  Service: Radiology;  Laterality: N/A;   Patient Active Problem List   Diagnosis Date Noted   Hepatic cirrhosis (HCC) 08/14/2023   Other cirrhosis of liver (HCC) 08/11/2023   Iron deficiency anemia 08/11/2023   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   Decompensated cirrhosis (HCC) 07/31/2023   Bleeding gastric varices 07/31/2023   Cerebrovascular accident (CVA) (HCC) 07/31/2023   Type 2 diabetes mellitus (HCC) 07/30/2023    ONSET DATE: 08/12/23  REFERRING DIAG: I63.9 (ICD-10-CM) - Stroke (HCC)   THERAPY DIAG:  Difficulty in walking, not elsewhere classified - Plan: PT plan of care cert/re-cert  Muscle weakness (generalized) - Plan: PT plan of care cert/re-cert  Other abnormalities of gait and mobility - Plan: PT plan of care cert/re-cert  Other lack of coordination - Plan: PT plan of care cert/re-cert  Unsteadiness on feet - Plan: PT plan of care cert/re-cert  Cerebrovascular accident (CVA), unspecified mechanism (HCC) - Plan: PT plan of care cert/re-cert  Rationale for Evaluation and Treatment: Rehabilitation  SUBJECTIVE:  SUBJECTIVE STATEMENT:  Doing well, nothing new since last time   PERTINENT HISTORY:  Per hospital PT evaluation note: 68 y.o. female presents to [facility] hospital on 07/30/2023 after a fall and shaking episode. 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 Additional punctate acute infarcts in right cerebellum, left temporal lobe, left occipital lobe, right occipital lobe, right parietal lobe, right frontal lobe, right thalamus, left frontal lobe. Pt with bloody bowel movement and hematemesis on 12/24. PMH includes hepatic cirrhosis, gastric and splenic varices, sepsis.   PAIN:  Are you having pain? Yes: NPRS scale: 6/10  Pain location: L knee Pain description: Chronic Aggravating factors: Cold weather, too much walking. Relieving factors: Has knee brace which helps, was taking Allieve, but had to  stop since stroke.  PRECAUTIONS: Fall and Other: L visual field cut  RED FLAGS: None   WEIGHT BEARING RESTRICTIONS: No  FALLS: Has patient fallen in last 6 months? Yes. Number of falls 1  LIVING ENVIRONMENT: Lives with: lives with their family Lives in: House/apartment Stairs: Yes: Internal: 18 steps; on left going up and External: 1 steps; none Has following equipment at home: Walker - 2 wheeled  PLOF: Independent  PATIENT GOALS: Patient would like to improve her balance, minimize knee pain, return her normal daily activities.  OBJECTIVE:  Note: Objective measures were completed at Evaluation unless otherwise noted.  DIAGNOSTIC FINDINGS: N/A  COGNITION: Overall cognitive status: Within functional limits for tasks assessed   SENSATION: Patient denies change  COORDINATION: Heel slide on shin, WNL  EDEMA:  Patient denies  MUSCLE TONE: WNL  MUSCLE LENGTH: Hamstrings: Right 75 deg; Left 60 deg Thomas test: WNL B.  POSTURE: increased thoracic kyphosis and weight shift right  LOWER EXTREMITY ROM:   WNL B.  LOWER EXTREMITY MMT:  R 5/5  MMT Right Eval Left Eval  Hip flexion  4-  Hip extension  4-  Hip abduction  4-  Hip adduction    Hip internal rotation    Hip external rotation    Knee flexion  4  Knee extension  4  Ankle dorsiflexion  4  Ankle plantarflexion  4-  Ankle inversion    Ankle eversion    (Blank rows = not tested)  BED MOBILITY:  I  TRANSFERS: I  RAMP:  S  CURB:  CGA  STAIRS: Level of Assistance: SBA Stair Negotiation Technique: Step to Pattern with Single Rail on Left Number of Stairs: 18  Height of Stairs: 6"  Comments: Patient reports she is stable with a rail  GAIT: Gait pattern: step through pattern, decreased arm swing- Right, decreased arm swing- Left, shuffling, and lateral hip instability Distance walked: in clinic distances Assistive device utilized: None Level of assistance: SBA Comments: Patient reports that  she walks with her son or husband when outside of her house for safety.  FUNCTIONAL TESTS:  5 times sit to stand: 18.81 Berg Balance Scale: 39   09/20/23 = 47/56 Functional gait assessment: 16  TREATMENT DATE 09/20/23 Bike Level 3 x 6 minutes On airex 5# pulls 2 x10 STS on airex On airex ball toss On airex volleyball4 Berg Balance test score:  47/56 On airex balance beam side stepping and tandem walking Rockerboard 2 ways reaching for numbers on wall, needed ModA for balance On airex cone toe touches Feet on ball K2C, rotation, posterior activation, isometric abs Passive stretch LE's Hooklying green tband clamshells Ball b/n knees squeeze  09/18/23  Nustep L5x8 minutes all four extremities for w/u, tissue perfusion STS red TB around knees x10  Forward step ups 4 inch step x10 B Lateral step ups 4 inch step x10 B    Tandem stance blue foam pad 3x30 seconds  Forward alternating toe taps to targets while standing on blue foam pad x20 Cross midline toe taps to targets while standing on blue foam pad x20  Standing on blue air pads shoulder width apart 3x30 seconds    Sidelying hip ABD red TB 2x12 B Supine marches red TB 2x12 B   09/11/23  Nustep L5-6 all four extremities for tissue perfusion, promotion of reciprocal movement patterns Forward tandem walks on foam x4 laps in // bars Lateral stepping on foam x3 laps in // bars Forward step and wt shift onto BOSU x10 B Lateral step and wt shift onto BOSU x10 B  Lateral rocks on rockerboard x2 minutes light BUE support/min guard from PT AP rocks on rockerboard x2 minutes light BUE support/min guard from PT     Bridges + green TB around knees 2x12  Sidelying hip ABD green TB x10 B Mod cues for form/alignment  Walking bridges  x10 no band double steps forward    09/06/23 Bike L3 x 2, then 2 x 30 sec  fast pedal. SLS with step taps- Progressed from single taps, to 3 taps, to taps with rotation ( corner to corner), then place one foot on step and rotate to the side and back x 5 to each side., no UE support, mild unsteadiness. Ambulation while throwing and catching a small ball, first to herself, then back and forth with therapist. She dropped ball once and actually took a few quick steps to retrieve the ball, no LOB. Step back with rotation- pick up a cone, step back and turn foot out, then reach behind to place the cone on mat. X 6 reps each way., good stability. Alternating cone taps while standing on black floor mat with side step. Required min a for balance x 3, but improved stability with repetition.  09/05/23 NuStep L5 x 6 minutes Standing side to side step x 10 reps, R tband, in parallel bars Standing hip abd, ext, flex x 10 each leg, in parallel bars, R tband at knees Standing march without UE support, required about 50% UE support Standing heel raises at counter, no UE support needed, 2 x 10 reps Tandem stance on airex plank in parallel bars, static, arm swings, weight shifts. Required about 50% UE support, demonstrated excellent balance reactions. Heel raises/drop with toes elevated, with step back for balance. Initially had difficulty controlling. Improved with repetition.   [facility] Adult PT Treatment:                                                DATE: 08/31/23 Therapeutic Exercise: Hands across chest to perform STS from chair x10 with  eccentric control improving through session Step ups onto 4" stair with uni UE support with slow eccentric x10 ea  Neuromuscular re-ed: the following were performed in  bars to bring awareness to B hemibody, promoting anticipatory and reactionary balance with occasional visual feedback. Feet hip width eyes closed FTEO, FTEC x20" each Semi tandem B x20" each, able to perform tandem with RLE back with heel and toe side by side Feet hip width eyes  closed on airex x20" FTEO on airex Marching on airex x20 Step ups onto airex x15 ea Step ups alt x12 Anterior weight shift with head turns Airex beam tandem with BUE support x5 laps Airex beam side stepping with BUE support x3 laps Tandem and reverse tandem walking x3 laps   08/28/23 NuStep L4 x 6 minutes Tandem stance progression-In parallel bars-Static, arm swing, weight shift forward and back, passing 3# weight side to side, runners stride, x 10 each with each leg in front. Repeat with feet on airex. Performed alternating march in parallel stance rather than tandem, more unstable. Resisted Dead lifts holding R tband in each hand, under feet, 2 x 10 reps. Side to side stepping in parallel bars, increasing speed with repetitions Repeat on airex plank x 5 each way, not focused on speed. No UE support. Ambulation, 1 x 300', mildly SOB, but no unsteadiness or pain noted. MH to B knees in sitting x 5 minutes.  08/24/23 - Seated Hip Abduction with Resistance  - 1 x daily - 7 x weekly - 1-2 sets - 10 reps - 3 seconds hold (yellow band) - Sit to Stand  - 1 x daily - 7 x weekly - 1-2 sets - 5-10 reps (holding 2lb ball at chest) - Side Stepping with Counter Support  - 1 x daily - 7 x weekly - 2-3 sets - 10 reps (PT in front of patient without UE support) - Heel Raises with Counter Support  - 1 x daily - 7 x weekly - 1-2 sets - 10 reps - Standing March with Counter Support  - 1 x daily - 7 x weekly - 1-2 sets - 10 reps - Standing Hip Abduction with Counter Support  - 1 x daily - 7 x weekly - 1 sets - 10 reps (mirror feedback at ) - PPT x12 - Bridge 2x10 - lumbar rotations x10  08/20/23 Nustep level 5 x 6 minutes Leg curls 20# 2x10 LAQ 2x10 2.5# - pulls on left knee Step on and off airex In pbars step turn touch In front of bench feet stationary turn and touch cone to side and behind Then did the same on airex, had a loss of balance and went back onto bench sitting On airex catch and  volleyball Turn and toss with feet stationary Walking ball toss  08/15/23 Education    PATIENT EDUCATION: Education details: POC Person educated: Patient Education method: Explanation Education comprehension: verbalized understanding  HOME EXERCISE PROGRAM: Access Code: [email or web address] URL: [email or web address] Date: 08/24/2023 Prepared by: Gardiner Rhyme  Exercises - Seated Hip Abduction with Resistance  - 1 x daily - 7 x weekly - 1-2 sets - 10 reps - 3 seconds hold - Sit to Stand  - 1 x daily - 7 x weekly - 1-2 sets - 5-10 reps - Side Stepping with Counter Support  - 1 x daily - 7 x weekly - 2-3 sets - 10 reps - Heel Raises with Counter Support  - 1 x daily - 7 x weekly - 1-2 sets -  10 reps - Standing March with Counter Support  - 1 x daily - 7 x weekly - 1-2 sets - 10 reps - Standing Hip Abduction with Counter Support  - 1 x daily - 7 x weekly - 1 sets - 10 reps  GOALS: Goals reviewed with patient? Yes  SHORT TERM GOALS: Target date: 09/04/23  I with initial HEP Baseline: Goal status: 08/28/23-met  LONG TERM GOALS: Target date: 10/10/23  I with final HEP Baseline:  Goal status: ongoing 09/20/23  2.  Complete 5 x STS without UE support in < 15 sec Baseline: 18.8, used UE Goal status: 09/06/23 14.25, no hands, much improved balance. Met  3.  Improve BERG score to at least 45/56 to indicate minimal fall risk Baseline: 39 Goal status: met 09/20/23 47/56  4.  Improve FGA score to at least 24/28 Baseline: 16 Goal status: INITIAL  5.  Increase L LE strength and trunk strength to at least 4+/5 to provide better support and help decrease knee pain. Baseline:  Goal status: INITIAL  6. Patient will walk at least 500' on level and unlevel surfaces I with no instability noted.  Baseline: Multiple gait abnormalities and limitations noted.  Goal Status: 09/05/23-Improved, but still fatigues and loses her quality of movement. ongoing  ASSESSMENT:  CLINICAL  IMPRESSION: Patient doing well, she denies any falls, reports that the bridges hurt her back.  She really does not trust herself on dynamic surfaces, has a hard time letting go of Pbars, even if she is doing well.  She improved in her Berg balance score 8 points in 10 visits.  She also has better SLR on the left  OBJECTIVE IMPAIRMENTS: decreased activity tolerance, decreased balance, decreased coordination, decreased endurance, decreased mobility, difficulty walking, decreased strength, and postural dysfunction.   ACTIVITY LIMITATIONS: standing, squatting, stairs, and locomotion level  PARTICIPATION LIMITATIONS: meal prep, cleaning, laundry, driving, shopping, and community activity  PERSONAL FACTORS: Past/current experiences are also affecting patient's functional outcome.   REHAB POTENTIAL: Good  CLINICAL DECISION MAKING: Stable/uncomplicated  EVALUATION COMPLEXITY: Moderate  PLAN:  PT FREQUENCY: 2x/week  PT DURATION: 10 weeks  PLANNED INTERVENTIONS: 97110-Therapeutic exercises, 97530- Therapeutic activity, [id number]- Neuromuscular re-education, 97535- Self Care, 16109- Manual therapy, [phone]- Gait training, [phone]- Electrical stimulation (unattended), Patient/Family education, Balance training, Stair training, and Joint mobilization  PLAN FOR NEXT SESSION: Review HEP/update PRN, progress strength and balance, NMR and balance challenges over unsteady surfaces. 1 Stacie Glaze, PT 09/20/23 8:42 AM

## 2023-09-24 ENCOUNTER — Encounter: Payer: Self-pay | Admitting: Student

## 2023-09-24 ENCOUNTER — Ambulatory Visit: Payer: Commercial Managed Care - PPO | Admitting: Student

## 2023-09-24 VITALS — BP 150/64 | HR 85 | Temp 98.1°F | Ht 66.5 in | Wt 186.4 lb

## 2023-09-24 DIAGNOSIS — K7460 Unspecified cirrhosis of liver: Secondary | ICD-10-CM

## 2023-09-24 DIAGNOSIS — E1100 Type 2 diabetes mellitus with hyperosmolarity without nonketotic hyperglycemic-hyperosmolar coma (NKHHC): Secondary | ICD-10-CM

## 2023-09-24 DIAGNOSIS — D509 Iron deficiency anemia, unspecified: Secondary | ICD-10-CM | POA: Diagnosis not present

## 2023-09-24 DIAGNOSIS — Z1239 Encounter for other screening for malignant neoplasm of breast: Secondary | ICD-10-CM

## 2023-09-24 DIAGNOSIS — K7290 Hepatic failure, unspecified without coma: Secondary | ICD-10-CM | POA: Diagnosis not present

## 2023-09-24 DIAGNOSIS — L219 Seborrheic dermatitis, unspecified: Secondary | ICD-10-CM

## 2023-09-24 DIAGNOSIS — Z1231 Encounter for screening mammogram for malignant neoplasm of breast: Secondary | ICD-10-CM

## 2023-09-24 DIAGNOSIS — E119 Type 2 diabetes mellitus without complications: Secondary | ICD-10-CM

## 2023-09-24 DIAGNOSIS — M25562 Pain in left knee: Secondary | ICD-10-CM

## 2023-09-24 DIAGNOSIS — G8929 Other chronic pain: Secondary | ICD-10-CM

## 2023-09-24 DIAGNOSIS — I639 Cerebral infarction, unspecified: Secondary | ICD-10-CM | POA: Diagnosis not present

## 2023-09-24 DIAGNOSIS — Z794 Long term (current) use of insulin: Secondary | ICD-10-CM

## 2023-09-24 MED ORDER — HYDROCORTISONE 1 % EX CREA
TOPICAL_CREAM | CUTANEOUS | 1 refills | Status: DC
Start: 2023-09-24 — End: 2024-06-23

## 2023-09-24 MED ORDER — DICLOFENAC SODIUM 1 % EX GEL
4.0000 g | Freq: Four times a day (QID) | CUTANEOUS | Status: AC
Start: 2023-09-24 — End: ?

## 2023-09-24 MED ORDER — KETOCONAZOLE 2 % EX CREA
TOPICAL_CREAM | Freq: Two times a day (BID) | CUTANEOUS | Status: DC
Start: 2023-09-24 — End: 2023-10-11

## 2023-09-24 NOTE — Assessment & Plan Note (Signed)
 Reports unilateral left knee pain, which seems to be chronic.  She has been doing physical therapy and she notes that some movements worsen the pain and some exercises seem to help with the pain.  She used to use Motrin but now has stopped given her recent variceal bleed and anticoagulation use, which is appropriate.  I have sent a prescription for Voltaren gel, which hopefully will provide some pain relief for what is likely arthritic pain without significant systemic absorption given her high bleeding risk.

## 2023-09-24 NOTE — Assessment & Plan Note (Addendum)
 She was found to have new decompensated cirrhosis likely secondary to [facility].  This was complicated by a variceal bleed.  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  She has also been taking pantoprazole 40 mg daily since her hospitalization and has completed her medication course.  On exam, there is no evidence of new ascites or swelling.  She has not had any episodes of bleeding.  Her MELD score was 8 in January.  She is continuing to take carvedilol 3.125 mg twice daily for variceal bleeding prophylaxis.  GI and IR follow-up appointments have been scheduled.

## 2023-09-24 NOTE — Assessment & Plan Note (Addendum)
 Last hemoglobin A1c on 1/15 was 6.9%.  Microalbumin/creatinine was within normal limits.  She has been using 30 units of Basaglar insulin daily.  Her fasting blood sugars are still quite elevated in the 200s.  She denies any nighttime snacking and says she eats dinner around 6 or 7 PM. She also denies any ongoing polyuria, polydipsia, or neuropathy.  She has not had any lows.  Since her fasting blood sugars are still high, I have recommended she increase her insulin to 33 units daily.  It is too soon to recheck hemoglobin A1c, but we will schedule a follow-up visit in 2 months and we will recheck it at that time.

## 2023-09-24 NOTE — Assessment & Plan Note (Addendum)
 Has a recent history of an embolic stroke.  She continues to work with PT/OT and feels this is going well.  She does not have any new neurological deficits and her mild residual deficits are improving.  She has a follow-up appointment with neurology on 2/19.  She continues to take Eliquis 5 mg twice daily and has not missed any doses.  She is having difficulty affording this medication and is requesting assistance figuring out how she can get it refilled more cheaply.  For now, she has enough to last her through the next 2 months until her follow-up appointment.  She has a follow-up appointment scheduled later this year with cardiology to consider whether to do further imaging to try to identify the embolic source of her stroke.

## 2023-09-24 NOTE — Progress Notes (Signed)
 Internal Medicine Clinic Attending  I was physically present during the key portions of the resident provided service and participated in the medical decision making of patient's management care. I reviewed pertinent patient test results.  The assessment, diagnosis, and plan were formulated together and I agree with the documentation in the resident's note.  Carney Living, MD

## 2023-09-24 NOTE — Progress Notes (Signed)
 CC: routine f/u  HPI:  [name] is a 68 y.o. female living with a history stated below and presents today for routine f/u. Please see problem based assessment and plan for additional details.  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 60 tablet 0   apixaban (ELIQUIS) 5 MG TABS tablet Take 1 tablet (5 mg total) by mouth 2 (two) times daily. 120 tablet 0   Blood Glucose Monitoring Suppl DEVI 1 each by Does not apply route 3 (three) times daily. May dispense any manufacturer covered by patient's insurance. 1 each 2   carvedilol (COREG) 3.125 MG tablet TAKE 1 TABLET BY MOUTH TWICE A DAY WITH A MEAL 60 tablet 1   clotrimazole (LOTRIMIN) 1 % cream Apply to affected area 2 times daily 60 g 1   erythromycin ophthalmic ointment Place 1 Application into the left eye 4 (four) times daily.     ferrous sulfate 325 (65 FE) MG tablet Take 1 tablet (325 mg total) by mouth daily. 30 tablet 2   Glucagon, rDNA, (GLUCAGON EMERGENCY) 1 MG KIT Inject 1 mg into the skin as needed for up to 2 doses (Severe low blood sugar). 1 kit 1   Glucose Blood (BLOOD GLUCOSE TEST STRIPS) STRP 1 each by Does not apply route 3 (three) times daily. Use as directed to check blood sugar. May dispense any manufacturer covered by patient's insurance and fits patient's device. 90 each 2   Insulin Glargine (BASAGLAR KWIKPEN) 100 UNIT/ML Inject 30 Units into the skin daily. May substitute as needed per insurance. 15 mL 1   Insulin Pen Needle (PEN NEEDLES) 31G X 5 MM MISC 1 each by Does not apply route 3 (three) times daily. May dispense any manufacturer covered by patient's insurance. 90 each 3   Lancet Device MISC 1  each by Does not apply route 3 (three) times daily. May dispense any manufacturer covered by patient's insurance. 1 each 3   Lancets MISC 1 each by Does not apply route 3 (three) times daily. Use as directed to check blood sugar. May dispense any manufacturer covered by patient's insurance and fits patient's device. 1 each 2   mupirocin ointment (BACTROBAN) 2 % Apply 1 Application topically 2 (two) times daily.     pantoprazole (PROTONIX) 40 MG tablet PLEASE SEE ATTACHED FOR DETAILED DIRECTIONS 30 tablet 0   No current facility-administered medications on file prior to visit.    History reviewed. No pertinent family history.  Social History   Socioeconomic History   Marital status: Single    Spouse name: Not on file   Number of children: Not on file   Years of education: Not on file   Highest education level: Not on file  Occupational History   Not on file  Tobacco Use   Smoking status: Every Day   Smokeless tobacco: Never  Substance and Sexual Activity   Alcohol use: Yes   Drug use: Never   Sexual activity: Not Currently  Other Topics Concern   Not on file  Social History Narrative   Not on file   Social Drivers of Corporate investment banker  Strain: Not on file  Food Insecurity: No Food Insecurity (07/30/2023)   Hunger Vital Sign    Worried About Running Out of Food in the Last Year: Never true    Ran Out of Food in the Last Year: Never true  Transportation Needs: No Transportation Needs (07/30/2023)   PRAPARE - Administrator, Civil Service (Medical): No    Lack of Transportation (Non-Medical): No  Physical Activity: Not on file  Stress: Not on file  Social Connections: Unknown (05/14/2023)   Received from [facility]   Social Network    Social Network: Not on file  Intimate Partner Violence: Not At Risk (07/30/2023)   Humiliation, Afraid, Rape, and Kick questionnaire    Fear of Current or Ex-Partner: No    Emotionally Abused: No    Physically  Abused: No    Sexually Abused: No   Review of Systems: ROS negative except for what is noted on the assessment and plan.  Vitals:   09/24/23 1042  BP: (!) 150/64  Pulse: 85  Temp: 98.1 F (36.7 C)  TempSrc: Oral  SpO2: 99%  Weight: 186 lb 6.4 oz (84.6 kg)  Height: 5' 6.5" (1.689 m)   Physical Exam: Well-appearing woman in no acute distress Confluent, erythematous circular rash enveloping the nose, nasolabial folds, and upper lip; not on pruritic, mild tenderness, no purulence Heart rate and rhythm regular, no murmurs, no lower extremity edema Lungs clear to auscultation bilaterally, normal respiratory rate and effort No spider angiomas Abdomen soft, no apparent ascites, no tenderness to palpation No focal neurological deficits  Assessment & Plan:   Patient discussed with Dr. Deirdre Priest  Type 2 diabetes mellitus (HCC) Last hemoglobin A1c on 1/15 was 6.9%.  Microalbumin/creatinine was within normal limits.  She has been using 30 units of Basaglar insulin daily.  Her fasting blood sugars are still quite elevated in the 200s.  She denies any nighttime snacking and says she eats dinner around 6 or 7 PM. She also denies any ongoing polyuria, polydipsia, or neuropathy.  She has not had any lows.  Since her fasting blood sugars are still high, I have recommended she increase her insulin to 33 units daily.  It is too soon to recheck hemoglobin A1c, but we will schedule a follow-up visit in 2 months and we will recheck it at that time.  Decompensated cirrhosis (HCC) She was found to have new decompensated cirrhosis likely secondary to [facility].  This was complicated by a variceal bleed.  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  She has also been taking pantoprazole 40 mg daily since her hospitalization and  has completed her medication course.  On exam, there is no evidence of new ascites or swelling.  She has not had any episodes of bleeding.  Her MELD score was 8 in January.  She is continuing to take carvedilol 3.125 mg twice daily for variceal bleeding prophylaxis.  GI and IR follow-up appointments have been scheduled.  Cerebrovascular accident (CVA) [facility]) Has a recent history of an embolic stroke.  She continues to work with PT/OT and feels this is going well.  She does not have any new neurological deficits and her mild residual deficits are improving.  She has a follow-up appointment with neurology on 2/19.  She continues to take Eliquis 5  mg twice daily and has not missed any doses.  She is having difficulty affording this medication and is requesting assistance figuring out how she can get it refilled more cheaply.  For now, she has enough to last her through the next 2 months until her follow-up appointment.  She has a follow-up appointment scheduled later this year with cardiology to consider whether to do further imaging to try to identify the embolic source of her stroke.  Iron deficiency anemia History of iron deficiency currently on oral iron.  Recommend rechecking iron panel at next follow-up visit.  Seborrheic dermatitis She has a confluent, flaky red rash over her nose and upper lip region, which she states has been going on for several months.  It is nonpruritic.  It has been tender at times.  She has tried mupirocin without much improvement.  She has also tried Keflex.  Its appearance is suggestive of seborrheic dermatitis, so I will prescribe a hydrocortisone cream for immediate relief and ketoconazole cream, which she should use for the next 4 weeks to treat the underlying infection.  If her symptoms do not improve despite these creams, I recommended she call our clinic for reevaluation.  Chronic pain of left knee Reports unilateral left knee pain, which seems to be chronic.  She has  been doing physical therapy and she notes that some movements worsen the pain and some exercises seem to help with the pain.  She used to use Motrin but now has stopped given her recent variceal bleed and anticoagulation use, which is appropriate.  I have sent a prescription for Voltaren gel, which hopefully will provide some pain relief for what is likely arthritic pain without significant systemic absorption given her high bleeding risk.   Annett Fabian, MD [facility], PGY-1 Phone: [phone] Date 09/24/2023 Time 12:29 PM

## 2023-09-24 NOTE — Assessment & Plan Note (Signed)
 History of iron deficiency currently on oral iron.  Recommend rechecking iron panel at next follow-up visit.

## 2023-09-24 NOTE — Patient Instructions (Signed)
 Thank you, Ms.[name] for allowing [place] to provide your care today. Today we discussed your diabetes, cirrhosis, and facial rash.  As we discussed, we will schedule a 47-month follow-up visit to recheck your hemoglobin A1c.  Since your fasting blood sugars are still high, I recommend you increase your insulin to 33 units daily.  At your follow-up visit, we can discuss possibly switching to oral medications depending on what your hemoglobin A1c is.  For your facial rash, we have sent hydrocortisone cream to the pharmacy for you to use twice a daily as needed for symptomatic relief.  I will also send in a prescription for ketoconazole cream, which she can start using after week of the hydrocortisone cream.  If your symptoms do not improve with these creams, please give our clinic a call and get her other medications.  Please keep taking your other medications as prescribed keep up the good work in rehab.  I have ordered the following medication/changed the following medications:   Start the following medications: Meds ordered this encounter  Medications   diclofenac Sodium (VOLTAREN) 1 % topical gel 4 g   hydrocortisone cream 1 %    Sig: Apply to affected area 2 times daily for symptomatic relief    Dispense:  30 g    Refill:  1     Follow up: 2 months   We look forward to seeing you next time. Please call our clinic at [phone] if you have any questions or concerns. The best time to call is Monday-Friday from 9am-4pm, but there is someone available 24/7. If after hours or the weekend, call the main hospital number and ask for the Internal Medicine Resident On-Call. If you need medication refills, please notify your pharmacy one week in advance and they will send [place] a request.   Thank you for trusting me with your care. Wishing you the best!   Annett Fabian, MD [facility]

## 2023-09-24 NOTE — Assessment & Plan Note (Signed)
 She has a confluent, flaky red rash over her nose and upper lip region, which she states has been going on for several months.  It is nonpruritic.  It has been tender at times.  She has tried mupirocin without much improvement.  She has also tried Keflex.  Its appearance is suggestive of seborrheic dermatitis, so I will prescribe a hydrocortisone cream for immediate relief and ketoconazole cream, which she should use for the next 4 weeks to treat the underlying infection.  If her symptoms do not improve despite these creams, I recommended she call our clinic for reevaluation.

## 2023-09-25 ENCOUNTER — Ambulatory Visit: Payer: Commercial Managed Care - PPO | Admitting: Occupational Therapy

## 2023-09-25 ENCOUNTER — Ambulatory Visit: Payer: Commercial Managed Care - PPO | Admitting: Physical Therapy

## 2023-09-25 ENCOUNTER — Telehealth: Payer: Self-pay | Admitting: Adult Health

## 2023-09-25 ENCOUNTER — Encounter: Payer: Self-pay | Admitting: Physical Therapy

## 2023-09-25 DIAGNOSIS — M6281 Muscle weakness (generalized): Secondary | ICD-10-CM

## 2023-09-25 DIAGNOSIS — R41840 Attention and concentration deficit: Secondary | ICD-10-CM

## 2023-09-25 DIAGNOSIS — R41842 Visuospatial deficit: Secondary | ICD-10-CM

## 2023-09-25 DIAGNOSIS — R2681 Unsteadiness on feet: Secondary | ICD-10-CM

## 2023-09-25 DIAGNOSIS — R278 Other lack of coordination: Secondary | ICD-10-CM

## 2023-09-25 DIAGNOSIS — R2689 Other abnormalities of gait and mobility: Secondary | ICD-10-CM

## 2023-09-25 DIAGNOSIS — R262 Difficulty in walking, not elsewhere classified: Secondary | ICD-10-CM | POA: Diagnosis not present

## 2023-09-25 DIAGNOSIS — R41844 Frontal lobe and executive function deficit: Secondary | ICD-10-CM

## 2023-09-25 NOTE — Therapy (Signed)
 OUTPATIENT PHYSICAL THERAPY NEURO TREATMENT       Patient Name: Maria Rose MRN: [id number] DOB:1955-09-25, 68 y.o., female Today's Date: 08/15/2023  PCP: N/A REFERRING PROVIDER: Mercie Eon, MD      PT End of Session - 09/25/23 1349     Visit Number 11    Date for PT Re-Evaluation 10/10/23    Authorization Type [employer] [facility]    PT Start Time 1340    PT Stop Time 1420    PT Time Calculation (min) 40 min    Activity Tolerance Patient tolerated treatment well    Behavior During Therapy [facility] for tasks assessed/performed                    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   Past Surgical History:  Procedure Laterality Date   ESOPHAGOGASTRODUODENOSCOPY (EGD) WITH PROPOFOL N/A 07/31/2023   Procedure: ESOPHAGOGASTRODUODENOSCOPY (EGD) WITH PROPOFOL;  Surgeon: Jenel Lucks, MD;  Location: [facility] ENDOSCOPY;  Service: Gastroenterology;  Laterality: N/A;   HEMOSTASIS CONTROL  07/31/2023   Procedure: HEMOSTASIS CONTROL;  Surgeon: Jenel Lucks, MD;  Location: [facility] ENDOSCOPY;  Service: Gastroenterology;;   IR ANGIOGRAM VISCERAL SELECTIVE  08/07/2023   IR ANGIOGRAM VISCERAL SELECTIVE  08/07/2023   IR EMBO VENOUS NOT HEMORR HEMANG  INC GUIDE ROADMAPPING  08/05/2023   IR RENAL SELECTIVE  UNI INC S&I MOD SED  08/07/2023   IR US GUIDE VASC ACCESS RIGHT  08/05/2023   RADIOLOGY WITH ANESTHESIA N/A 08/05/2023   Procedure: IR WITH ANESTHESIA -TIPS;  Surgeon: Radiologist, Medication, MD;  Location: [facility] OR;  Service: Radiology;  Laterality: N/A;   Patient Active Problem List   Diagnosis Date Noted   Hepatic cirrhosis (HCC) 08/14/2023   Other cirrhosis of liver (HCC) 08/11/2023   Iron deficiency anemia 08/11/2023   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   Decompensated cirrhosis (HCC) 07/31/2023   Bleeding gastric varices 07/31/2023    Cerebrovascular accident (CVA) (HCC) 07/31/2023   Type 2 diabetes mellitus (HCC) 07/30/2023    ONSET DATE: 08/12/23  REFERRING DIAG: I63.9 (ICD-10-CM) - Stroke (HCC)   THERAPY DIAG:  Difficulty in walking, not elsewhere classified - Plan: PT plan of care cert/re-cert  Muscle weakness (generalized) - Plan: PT plan of care cert/re-cert  Other abnormalities of gait and mobility - Plan: PT plan of care cert/re-cert  Other lack of coordination - Plan: PT plan of care cert/re-cert  Unsteadiness on feet - Plan: PT plan of care cert/re-cert  Cerebrovascular accident (CVA), unspecified mechanism (HCC) - Plan: PT plan of care cert/re-cert  Rationale for Evaluation and Treatment: Rehabilitation  SUBJECTIVE:  SUBJECTIVE STATEMENT:   Doing OK, I saw the doctor yesterday and my A1C is doing a lot better, happy for that. Might end up getting an injection in my knee. Maria Rose are hard my back, would prefer to avoid them but otherwise would like to work on strength    PERTINENT HISTORY:  Per hospital PT evaluation note: 68 y.o. female [facility] to [facility] hospital on 07/30/2023 after a fall and shaking episode. 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 Additional punctate acute infarcts in right cerebellum, left temporal lobe, left occipital lobe, right occipital lobe, right parietal lobe, right frontal lobe, right thalamus, left frontal lobe. Pt with bloody bowel movement and hematemesis on 12/24. PMH includes hepatic cirrhosis, gastric and splenic varices, sepsis.   PAIN:  Are you having pain? Yes: NPRS scale: 6/10  Pain location: L knee Pain description: Chronic Aggravating factors: Cold weather, too much  walking. Relieving factors: Has knee brace which helps, was taking Allieve, but had to stop since stroke.  PRECAUTIONS: Fall and Other: L visual field cut  RED FLAGS: None   WEIGHT BEARING RESTRICTIONS: No  FALLS: Has patient fallen in last 6 months? Yes. Number of falls 1  LIVING ENVIRONMENT: Lives with: lives with their family Lives in: House/apartment Stairs: Yes: Internal: 18 steps; on left going up and External: 1 steps; none Has following equipment at home: Walker - 2 wheeled  PLOF: Independent  PATIENT GOALS: Patient would like to improve her balance, minimize knee pain, return her normal daily activities.  OBJECTIVE:  Note: Objective measures were completed at Evaluation unless otherwise noted.  DIAGNOSTIC FINDINGS: N/A  COGNITION: Overall cognitive status: Within functional limits for tasks assessed   SENSATION: Patient denies change  COORDINATION: Heel slide on shin, WNL  EDEMA:  Patient denies  MUSCLE TONE: WNL  MUSCLE LENGTH: Hamstrings: Right 75 deg; Left 60 deg Thomas test: WNL B.  POSTURE: increased thoracic kyphosis and weight shift right  LOWER EXTREMITY ROM:   WNL B.  LOWER EXTREMITY MMT:  R 5/5  MMT Right Eval Left Eval  Hip flexion  4-  Hip extension  4-  Hip abduction  4-  Hip adduction    Hip internal rotation    Hip external rotation    Knee flexion  4  Knee extension  4  Ankle dorsiflexion  4  Ankle plantarflexion  4-  Ankle inversion    Ankle eversion    (Blank rows = not tested)  BED MOBILITY:  I  TRANSFERS: I  RAMP:  S  CURB:  CGA  STAIRS: Level of Assistance: SBA Stair Negotiation Technique: Step to Pattern with Single Rail on Left Number of Stairs: 18  Height of Stairs: 6"  Comments: Patient reports she is stable with a rail  GAIT: Gait pattern: step through pattern, decreased arm swing- Right, decreased arm swing- Left, shuffling, and lateral hip instability Distance walked: in clinic  distances Assistive device utilized: None Level of assistance: SBA Comments: Patient reports that she walks with her son or husband when outside of her house for safety.  FUNCTIONAL TESTS:  5 times sit to stand: 18.81 Berg Balance Scale: 39   09/20/23 = 47/56 Functional gait assessment: 16  TREATMENT DATE   09/25/23   Nustep L5x8 minutes all four extremities for w/u prior to exercise, tissue perfusion Standing on blue air pads x45 seconds Semi tandem stance on blue air pads 2x30 seconds, limited by knee pain   Supine marches red TB above thighs 2x12 B Sidelying hip ABD red TB x15 B STS with red TB above knees x10  3 way hip red TB above knees x10 B Hip hikes x12 B Hip hikes + ABD x10 B  Forward step ups 4 inch step x12 B         09/20/23 Bike Level 3 x 6 minutes On airex 5# pulls 2 x10 STS on airex On airex ball toss On airex volleyball4 Berg Balance test score:  47/56 On airex balance beam side stepping and tandem walking Rockerboard 2 ways reaching for numbers on wall, needed ModA for balance On airex cone toe touches Feet on ball K2C, rotation, posterior activation, isometric abs Passive stretch LE's Hooklying green tband clamshells Ball b/n knees squeeze  09/18/23  Nustep L5x8 minutes all four extremities for w/u, tissue perfusion STS red TB around knees x10  Forward step ups 4 inch step x10 B Lateral step ups 4 inch step x10 B    Tandem stance blue foam pad 3x30 seconds  Forward alternating toe taps to targets while standing on blue foam pad x20 Cross midline toe taps to targets while standing on blue foam pad x20  Standing on blue air pads shoulder width apart 3x30 seconds    Sidelying hip ABD red TB 2x12 B Supine marches red TB 2x12 B   09/11/23  Nustep L5-6 all four extremities for tissue perfusion, promotion of  reciprocal movement patterns Forward tandem walks on foam x4 laps in // bars Lateral stepping on foam x3 laps in // bars Forward step and wt shift onto BOSU x10 B Lateral step and wt shift onto BOSU x10 B  Lateral rocks on rockerboard x2 minutes light BUE support/min guard from PT AP rocks on rockerboard x2 minutes light BUE support/min guard from PT     Bridges + green TB around knees 2x12  Sidelying hip ABD green TB x10 B Mod cues for form/alignment  Walking bridges  x10 no band double steps forward    09/06/23 Bike L3 x 2, then 2 x 30 sec fast pedal. SLS with step taps- Progressed from single taps, to 3 taps, to taps with rotation ( corner to corner), then place one foot on step and rotate to the side and back x 5 to each side., no UE support, mild unsteadiness. Ambulation while throwing and catching a small ball, first to herself, then back and forth with therapist. She dropped ball once and actually took a few quick steps to retrieve the ball, no LOB. Step back with rotation- pick up a cone, step back and turn foot out, then reach behind to place the cone on mat. X 6 reps each way., good stability. Alternating cone taps while standing on black floor mat with side step. Required min a for balance x 3, but improved stability with repetition.  09/05/23 NuStep L5 x 6 minutes Standing side to side step x 10 reps, R tband, in parallel bars Standing hip abd, ext, flex x 10 each leg, in parallel bars, R tband at knees Standing march without UE support, required about 50% UE support Standing heel raises at counter, no UE support needed, 2 x 10 reps Tandem stance on airex plank in parallel  bars, static, arm swings, weight shifts. Required about 50% UE support, demonstrated excellent balance reactions. Heel raises/drop with toes elevated, with step back for balance. Initially had difficulty controlling. Improved with repetition.   [facility] Adult PT Treatment:                                                 DATE: 08/31/23 Therapeutic Exercise: Hands across chest to perform STS from chair x10 with eccentric control improving through session Step ups onto 4" stair with uni UE support with slow eccentric x10 ea  Neuromuscular re-ed: the following were performed in  bars to bring awareness to B hemibody, promoting anticipatory and reactionary balance with occasional visual feedback. Feet hip width eyes closed FTEO, FTEC x20" each Semi tandem B x20" each, able to perform tandem with RLE back with heel and toe side by side Feet hip width eyes closed on airex x20" FTEO on airex Marching on airex x20 Step ups onto airex x15 ea Step ups alt x12 Anterior weight shift with head turns Airex beam tandem with BUE support x5 laps Airex beam side stepping with BUE support x3 laps Tandem and reverse tandem walking x3 laps   08/28/23 NuStep L4 x 6 minutes Tandem stance progression-In parallel bars-Static, arm swing, weight shift forward and back, passing 3# weight side to side, runners stride, x 10 each with each leg in front. Repeat with feet on airex. Performed alternating march in parallel stance rather than tandem, more unstable. Resisted Dead lifts holding R tband in each hand, under feet, 2 x 10 reps. Side to side stepping in parallel bars, increasing speed with repetitions Repeat on airex plank x 5 each way, not focused on speed. No UE support. Ambulation, 1 x 300', mildly SOB, but no unsteadiness or pain noted. MH to B knees in sitting x 5 minutes.  08/24/23 - Seated Hip Abduction with Resistance  - 1 x daily - 7 x weekly - 1-2 sets - 10 reps - 3 seconds hold (yellow band) - Sit to Stand  - 1 x daily - 7 x weekly - 1-2 sets - 5-10 reps (holding 2lb ball at chest) - Side Stepping with Counter Support  - 1 x daily - 7 x weekly - 2-3 sets - 10 reps (PT in front of patient without UE support) - Heel Raises with Counter Support  - 1 x daily - 7 x weekly - 1-2 sets - 10 reps - Standing March  with Counter Support  - 1 x daily - 7 x weekly - 1-2 sets - 10 reps - Standing Hip Abduction with Counter Support  - 1 x daily - 7 x weekly - 1 sets - 10 reps (mirror feedback at ) - PPT x12 - Bridge 2x10 - lumbar rotations x10  08/20/23 Nustep level 5 x 6 minutes Leg curls 20# 2x10 LAQ 2x10 2.5# - pulls on left knee Step on and off airex In pbars step turn touch In front of bench feet stationary turn and touch cone to side and behind Then did the same on airex, had a loss of balance and went back onto bench sitting On airex catch and volleyball Turn and toss with feet stationary Walking ball toss  08/15/23 Education    PATIENT EDUCATION: Education details: POC Person educated: Patient Education method: Explanation Education comprehension: verbalized understanding  HOME  EXERCISE PROGRAM: Access Code: [email or web address] URL: [email or web address] Date: 08/24/2023 Prepared by: Gardiner Rhyme  Exercises - Seated Hip Abduction with Resistance  - 1 x daily - 7 x weekly - 1-2 sets - 10 reps - 3 seconds hold - Sit to Stand  - 1 x daily - 7 x weekly - 1-2 sets - 5-10 reps - Side Stepping with Counter Support  - 1 x daily - 7 x weekly - 2-3 sets - 10 reps - Heel Raises with Counter Support  - 1 x daily - 7 x weekly - 1-2 sets - 10 reps - Standing March with Counter Support  - 1 x daily - 7 x weekly - 1-2 sets - 10 reps - Standing Hip Abduction with Counter Support  - 1 x daily - 7 x weekly - 1 sets - 10 reps  GOALS: Goals reviewed with patient? Yes  SHORT TERM GOALS: Target date: 09/04/23  I with initial HEP Baseline: Goal status: 08/28/23-met  LONG TERM GOALS: Target date: 10/10/23  I with final HEP Baseline:  Goal status: ongoing 09/20/23  2.  Complete 5 x STS without UE support in < 15 sec Baseline: 18.8, used UE Goal status: 09/06/23 14.25, no hands, much improved balance. Met  3.  Improve BERG score to at least 45/56 to indicate minimal fall risk Baseline: 39 Goal  status: met 09/20/23 47/56  4.  Improve FGA score to at least 24/28 Baseline: 16 Goal status: INITIAL  5.  Increase L LE strength and trunk strength to at least 4+/5 to provide better support and help decrease knee pain. Baseline:  Goal status: INITIAL  6. Patient will walk at least 500' on level and unlevel surfaces I with no instability noted.  Baseline: Multiple gait abnormalities and limitations noted.  Goal Status: 09/05/23-Improved, but still fatigues and loses her quality of movement. ongoing  ASSESSMENT:  CLINICAL IMPRESSION:   Pt arrives doing well today, requested we work on strength today. Progressed strength/resistance interventions as tolerated given ongoing knee pain today, did well and remains motivated. Happy to hear that vitals and A1C are improving, education given on these numbers and risk of recurrent CVA. RPE 8/10 with session today.    OBJECTIVE IMPAIRMENTS: decreased activity tolerance, decreased balance, decreased coordination, decreased endurance, decreased mobility, difficulty walking, decreased strength, and postural dysfunction.   ACTIVITY LIMITATIONS: standing, squatting, stairs, and locomotion level  PARTICIPATION LIMITATIONS: meal prep, cleaning, laundry, driving, shopping, and community activity  PERSONAL FACTORS: Past/current experiences are also affecting patient's functional outcome.   REHAB POTENTIAL: Good  CLINICAL DECISION MAKING: Stable/uncomplicated  EVALUATION COMPLEXITY: Moderate  PLAN:  PT FREQUENCY: 2x/week  PT DURATION: 10 weeks  PLANNED INTERVENTIONS: 97110-Therapeutic exercises, 97530- Therapeutic activity, [id number]- Neuromuscular re-education, 97535- Self Care, [id number]- Manual therapy, [phone]- Gait training, [phone]- Electrical stimulation (unattended), Patient/Family education, Balance training, Stair training, and Joint mobilization  PLAN FOR NEXT SESSION: Review HEP/update PRN, progress strength and balance, NMR and balance  challenges over unsteady surfaces.    Nedra Hai, PT, DPT 09/25/23 2:23 PM
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
## 2023-09-25 NOTE — Telephone Encounter (Signed)
 VM box full, sent mychart msg informing pt of office closure.

## 2023-09-26 ENCOUNTER — Inpatient Hospital Stay: Payer: Medicare Other | Admitting: Adult Health

## 2023-09-27 ENCOUNTER — Ambulatory Visit: Payer: Commercial Managed Care - PPO | Admitting: Physical Therapy

## 2023-09-27 ENCOUNTER — Ambulatory Visit: Payer: Commercial Managed Care - PPO | Admitting: Occupational Therapy

## 2023-10-01 ENCOUNTER — Ambulatory Visit: Payer: Commercial Managed Care - PPO | Admitting: Occupational Therapy

## 2023-10-01 ENCOUNTER — Ambulatory Visit: Payer: Commercial Managed Care - PPO | Admitting: Physical Therapy

## 2023-10-01 ENCOUNTER — Encounter: Payer: Self-pay | Admitting: Physical Therapy

## 2023-10-01 ENCOUNTER — Ambulatory Visit: Payer: Commercial Managed Care - PPO | Attending: Physician Assistant

## 2023-10-01 DIAGNOSIS — R2681 Unsteadiness on feet: Secondary | ICD-10-CM

## 2023-10-01 DIAGNOSIS — R41844 Frontal lobe and executive function deficit: Secondary | ICD-10-CM

## 2023-10-01 DIAGNOSIS — R2689 Other abnormalities of gait and mobility: Secondary | ICD-10-CM

## 2023-10-01 DIAGNOSIS — R41840 Attention and concentration deficit: Secondary | ICD-10-CM

## 2023-10-01 DIAGNOSIS — R278 Other lack of coordination: Secondary | ICD-10-CM

## 2023-10-01 DIAGNOSIS — M6281 Muscle weakness (generalized): Secondary | ICD-10-CM

## 2023-10-01 DIAGNOSIS — R262 Difficulty in walking, not elsewhere classified: Secondary | ICD-10-CM | POA: Diagnosis not present

## 2023-10-01 DIAGNOSIS — I639 Cerebral infarction, unspecified: Secondary | ICD-10-CM

## 2023-10-01 DIAGNOSIS — R41842 Visuospatial deficit: Secondary | ICD-10-CM
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
## 2023-10-01 NOTE — Therapy (Signed)
 OUTPATIENT PHYSICAL THERAPY NEURO TREATMENT       Patient Name: SARAIH LORTON MRN: [id number] DOB:1956-01-07, 68 y.o., female Today's Date: 08/15/2023  PCP: N/A REFERRING PROVIDER: Mercie Eon, MD      PT End of Session - 10/01/23 1033     Visit Number 12    Date for PT Re-Evaluation 10/10/23    Authorization Type [employer] [facility]    PT Start Time 1016    PT Stop Time 1054    PT Time Calculation (min) 38 min    Activity Tolerance Patient tolerated treatment well    Behavior During Therapy [facility] for tasks assessed/performed                     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   Past Surgical History:  Procedure Laterality Date   ESOPHAGOGASTRODUODENOSCOPY (EGD) WITH PROPOFOL N/A 07/31/2023   Procedure: ESOPHAGOGASTRODUODENOSCOPY (EGD) WITH PROPOFOL;  Surgeon: Jenel Lucks, MD;  Location: [facility] ENDOSCOPY;  Service: Gastroenterology;  Laterality: N/A;   HEMOSTASIS CONTROL  07/31/2023   Procedure: HEMOSTASIS CONTROL;  Surgeon: Jenel Lucks, MD;  Location: [facility] ENDOSCOPY;  Service: Gastroenterology;;   IR ANGIOGRAM VISCERAL SELECTIVE  08/07/2023   IR ANGIOGRAM VISCERAL SELECTIVE  08/07/2023   IR EMBO VENOUS NOT HEMORR HEMANG  INC GUIDE ROADMAPPING  08/05/2023   IR RENAL SELECTIVE  UNI INC S&I MOD SED  08/07/2023   IR US GUIDE VASC ACCESS RIGHT  08/05/2023   RADIOLOGY WITH ANESTHESIA N/A 08/05/2023   Procedure: IR WITH ANESTHESIA -TIPS;  Surgeon: Radiologist, Medication, MD;  Location: [facility];  Service: Radiology;  Laterality: N/A;   Patient Active Problem List   Diagnosis Date Noted   Hepatic cirrhosis (HCC) 08/14/2023   Other cirrhosis of liver (HCC) 08/11/2023   Iron deficiency anemia 08/11/2023   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   Decompensated cirrhosis (HCC) 07/31/2023   Bleeding gastric varices 07/31/2023    Cerebrovascular accident (CVA) (HCC) 07/31/2023   Type 2 diabetes mellitus (HCC) 07/30/2023    ONSET DATE: 08/12/23  REFERRING DIAG: I63.9 (ICD-10-CM) - Stroke (HCC)   THERAPY DIAG:  Difficulty in walking, not elsewhere classified - Plan: PT plan of care cert/re-cert  Muscle weakness (generalized) - Plan: PT plan of care cert/re-cert  Other abnormalities of gait and mobility - Plan: PT plan of care cert/re-cert  Other lack of coordination - Plan: PT plan of care cert/re-cert  Unsteadiness on feet - Plan: PT plan of care cert/re-cert  Cerebrovascular accident (CVA), unspecified mechanism (HCC) - Plan: PT plan of care cert/re-cert  Rationale for Evaluation and Treatment: Rehabilitation  SUBJECTIVE:  SUBJECTIVE STATEMENT:  Got a prescription for voltaren for my knee, I felt OK after last PT session. Would like to make next time my DC day.     PERTINENT HISTORY:  Per hospital PT evaluation note: 68 y.o. female presents to [facility] on 07/30/2023 after a fall and shaking episode. 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 Additional punctate acute infarcts in right cerebellum, left temporal lobe, left occipital lobe, right occipital lobe, right parietal lobe, right frontal lobe, right thalamus, left frontal lobe. Pt with bloody bowel movement and hematemesis on 12/24. PMH includes hepatic cirrhosis, gastric and splenic varices, sepsis.   PAIN:  Are you having pain? Yes: NPRS scale: 5/10  Pain location: L knee Pain description: Chronic Aggravating factors: Cold weather, too much walking. Relieving factors: Has knee brace which helps, was taking Allieve, but had to stop since  stroke.  PRECAUTIONS: Fall and Other: L visual field cut  RED FLAGS: None   WEIGHT BEARING RESTRICTIONS: No  FALLS: Has patient fallen in last 6 months? Yes. Number of falls 1  LIVING ENVIRONMENT: Lives with: lives with their family Lives in: House/apartment Stairs: Yes: Internal: 18 steps; on left going up and External: 1 steps; none Has following equipment at home: Walker - 2 wheeled  PLOF: Independent  PATIENT GOALS: Patient would like to improve her balance, minimize knee pain, return her normal daily activities.  OBJECTIVE:  Note: Objective measures were completed at Evaluation unless otherwise noted.  DIAGNOSTIC FINDINGS: N/A  COGNITION: Overall cognitive status: Within functional limits for tasks assessed   SENSATION: Patient denies change  COORDINATION: Heel slide on shin, WNL  EDEMA:  Patient denies  MUSCLE TONE: WNL  MUSCLE LENGTH: Hamstrings: Right 75 deg; Left 60 deg Thomas test: WNL B.  POSTURE: increased thoracic kyphosis and weight shift right  LOWER EXTREMITY ROM:   WNL B.  LOWER EXTREMITY MMT:  R 5/5  MMT Right Eval Left Eval  Hip flexion  4-  Hip extension  4-  Hip abduction  4-  Hip adduction    Hip internal rotation    Hip external rotation    Knee flexion  4  Knee extension  4  Ankle dorsiflexion  4  Ankle plantarflexion  4-  Ankle inversion    Ankle eversion    (Blank rows = not tested)  BED MOBILITY:  I  TRANSFERS: I  RAMP:  S  CURB:  CGA  STAIRS: Level of Assistance: SBA Stair Negotiation Technique: Step to Pattern with Single Rail on Left Number of Stairs: 18  Height of Stairs: 6"  Comments: Patient reports she is stable with a rail  GAIT: Gait pattern: step through pattern, decreased arm swing- Right, decreased arm swing- Left, shuffling, and lateral hip instability Distance walked: in clinic distances Assistive device utilized: None Level of assistance: SBA Comments: Patient reports that she walks  with her son or husband when outside of her house for safety.  FUNCTIONAL TESTS:  5 times sit to stand: 18.81 Berg Balance Scale: 39   09/20/23 = 47/56 Functional gait assessment: 16  TREATMENT DATE   10/01/23  Nustep L5-6 all four extremities for 8 minutes for w/u, tissue perfusion, reciprocal movement    Standing 3 way hip 2x12 B red TB around knees  STS red TB around knees 2x12  Hip hikes x15 B Hip hikes + ABD x15 B   Standing on air pads 3x30 seconds One foot on blue air pad/one foot on BOSU 3x30 seconds B  Alternating toe taps to top of BOSU while standing on blue foam pad x20      09/25/23   Nustep L5x8 minutes all four extremities for w/u prior to exercise, tissue perfusion Standing on blue air pads x45 seconds Semi tandem stance on blue air pads 2x30 seconds, limited by knee pain   Supine marches red TB above thighs 2x12 B Sidelying hip ABD red TB x15 B STS with red TB above knees x10  3 way hip red TB above knees x10 B Hip hikes x12 B Hip hikes + ABD x10 B  Forward step ups 4 inch step x12 B         09/20/23 Bike Level 3 x 6 minutes On airex 5# pulls 2 x10 STS on airex On airex ball toss On airex volleyball4 Berg Balance test score:  47/56 On airex balance beam side stepping and tandem walking Rockerboard 2 ways reaching for numbers on wall, needed ModA for balance On airex cone toe touches Feet on ball K2C, rotation, posterior activation, isometric abs Passive stretch LE's Hooklying green tband clamshells Ball b/n knees squeeze  09/18/23  Nustep L5x8 minutes all four extremities for w/u, tissue perfusion STS red TB around knees x10  Forward step ups 4 inch step x10 B Lateral step ups 4 inch step x10 B    Tandem stance blue foam pad 3x30 seconds  Forward alternating toe taps to targets while standing on blue foam  pad x20 Cross midline toe taps to targets while standing on blue foam pad x20  Standing on blue air pads shoulder width apart 3x30 seconds    Sidelying hip ABD red TB 2x12 B Supine marches red TB 2x12 B      PATIENT EDUCATION: Education details: POC Person educated: Patient Education method: Explanation Education comprehension: verbalized understanding  HOME EXERCISE PROGRAM: Access Code: [email or web address] URL: [email or web address] Date: 08/24/2023 Prepared by: Gardiner Rhyme  Exercises - Seated Hip Abduction with Resistance  - 1 x daily - 7 x weekly - 1-2 sets - 10 reps - 3 seconds hold - Sit to Stand  - 1 x daily - 7 x weekly - 1-2 sets - 5-10 reps - Side Stepping with Counter Support  - 1 x daily - 7 x weekly - 2-3 sets - 10 reps - Heel Raises with Counter Support  - 1 x daily - 7 x weekly - 1-2 sets - 10 reps - Standing March with Counter Support  - 1 x daily - 7 x weekly - 1-2 sets - 10 reps - Standing Hip Abduction with Counter Support  - 1 x daily - 7 x weekly - 1 sets - 10 reps  GOALS: Goals reviewed with patient? Yes  SHORT TERM GOALS: Target date: 09/04/23  I with initial HEP Baseline: Goal status: 08/28/23-met  LONG TERM GOALS: Target date: 10/10/23  I with final HEP Baseline:  Goal status: ongoing 09/20/23  2.  Complete 5 x STS without UE support in < 15 sec Baseline: 18.8, used UE Goal status: 09/06/23 14.25, no hands, much improved balance. Met  3.  Improve BERG score to at least 45/56 to indicate minimal fall risk Baseline: 39 Goal status: met 09/20/23 47/56  4.  Improve FGA score to at least 24/28 Baseline: 16 Goal status: INITIAL  5.  Increase L LE strength and trunk strength to at least 4+/5 to provide better support and help decrease knee pain. Baseline:  Goal status: INITIAL  6. Patient will walk at least 500' on level and unlevel surfaces I with no instability noted.  Baseline: Multiple gait abnormalities and limitations noted.  Goal  Status: 09/05/23-Improved, but still fatigues and loses her quality of movement. ongoing  ASSESSMENT:  CLINICAL IMPRESSION:   Pt arrives doing well today, felt good after last session. Continued working on strength and balance per her request with some balance added in at the end. Making good progress and remains motivated. She would like to make her next visit DC day, will plan to update HEP and answer all questions at that time.   OBJECTIVE IMPAIRMENTS: decreased activity tolerance, decreased balance, decreased coordination, decreased endurance, decreased mobility, difficulty walking, decreased strength, and postural dysfunction.   ACTIVITY LIMITATIONS: standing, squatting, stairs, and locomotion level  PARTICIPATION LIMITATIONS: meal prep, cleaning, laundry, driving, shopping, and community activity  PERSONAL FACTORS: Past/current experiences are also affecting patient's functional outcome.   REHAB POTENTIAL: Good  CLINICAL DECISION MAKING: Stable/uncomplicated  EVALUATION COMPLEXITY: Moderate  PLAN:  PT FREQUENCY: 2x/week  PT DURATION: 10 weeks  PLANNED INTERVENTIONS: 97110-Therapeutic exercises, 97530- Therapeutic activity, [id number]- Neuromuscular re-education, 97535- Self Care, 16109- Manual therapy, [phone]- Gait training, [phone]- Electrical stimulation (unattended), Patient/Family education, Balance training, Stair training, and Joint mobilization  PLAN FOR NEXT SESSION: Review HEP/update PRN, progress strength and balance, NMR and balance challenges over unsteady surfaces.  Avoid bridges- aggravates back pain. Next session will be her DC per her request    Nedra Hai, PT, DPT 10/01/23 10:55 AM

## 2023-10-03 ENCOUNTER — Ambulatory Visit: Payer: Commercial Managed Care - PPO | Admitting: Occupational Therapy

## 2023-10-03 ENCOUNTER — Ambulatory Visit: Payer: Commercial Managed Care - PPO | Admitting: Physical Therapy

## 2023-10-03 ENCOUNTER — Encounter: Payer: Self-pay | Admitting: Physical Therapy

## 2023-10-03 DIAGNOSIS — R41842 Visuospatial deficit: Secondary | ICD-10-CM

## 2023-10-03 DIAGNOSIS — R262 Difficulty in walking, not elsewhere classified: Secondary | ICD-10-CM | POA: Diagnosis not present

## 2023-10-03 DIAGNOSIS — M6281 Muscle weakness (generalized): Secondary | ICD-10-CM

## 2023-10-03 DIAGNOSIS — R278 Other lack of coordination: Secondary | ICD-10-CM

## 2023-10-03 DIAGNOSIS — R2681 Unsteadiness on feet: Secondary | ICD-10-CM

## 2023-10-03 DIAGNOSIS — R41840 Attention and concentration deficit: Secondary | ICD-10-CM

## 2023-10-03 DIAGNOSIS — R41844 Frontal lobe and executive function deficit: Secondary | ICD-10-CM

## 2023-10-03 NOTE — Therapy (Signed)
 OUTPATIENT PHYSICAL THERAPY NEURO TREATMENT/DISCHARGE       Patient Name: Maria Rose MRN: [id number] DOB:01/19/1956, 68 y.o., female Today's Date: 08/15/2023  PCP: N/A REFERRING PROVIDER: Mercie Eon, MD   PHYSICAL THERAPY DISCHARGE SUMMARY  Visits from Start of Care: 13  Current functional level related to goals / functional outcomes: See below    Remaining deficits: See below    Education / Equipment: See below    Patient agrees to discharge. Patient goals were met. Patient is being discharged due to being pleased with the current functional level.       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   Past Surgical History:  Procedure Laterality Date   ESOPHAGOGASTRODUODENOSCOPY (EGD) WITH PROPOFOL N/A 07/31/2023   Procedure: ESOPHAGOGASTRODUODENOSCOPY (EGD) WITH PROPOFOL;  Surgeon: Jenel Lucks, MD;  Location: [facility] ENDOSCOPY;  Service: Gastroenterology;  Laterality: N/A;   HEMOSTASIS CONTROL  07/31/2023   Procedure: HEMOSTASIS CONTROL;  Surgeon: Jenel Lucks, MD;  Location: [facility] ENDOSCOPY;  Service: Gastroenterology;;   IR ANGIOGRAM VISCERAL SELECTIVE  08/07/2023   IR ANGIOGRAM VISCERAL SELECTIVE  08/07/2023   IR EMBO VENOUS NOT HEMORR HEMANG  INC GUIDE ROADMAPPING  08/05/2023   IR RENAL SELECTIVE  UNI INC S&I MOD SED  08/07/2023   IR US GUIDE VASC ACCESS RIGHT  08/05/2023   RADIOLOGY WITH ANESTHESIA N/A 08/05/2023   Procedure: IR WITH ANESTHESIA -TIPS;  Surgeon: Radiologist, Medication, MD;  Location: [facility] OR;   Service: Radiology;  Laterality: N/A;   Patient Active Problem List   Diagnosis Date Noted   Hepatic cirrhosis (HCC) 08/14/2023   Other cirrhosis of liver (HCC) 08/11/2023   Iron deficiency anemia 08/11/2023   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   Decompensated cirrhosis (HCC) 07/31/2023   Bleeding gastric varices 07/31/2023   Cerebrovascular accident (CVA) (HCC) 07/31/2023   Type 2 diabetes mellitus (HCC) 07/30/2023    ONSET DATE: 08/12/23  REFERRING DIAG: I63.9 (ICD-10-CM) - Stroke (HCC)   THERAPY DIAG:  Difficulty in walking, not elsewhere classified - Plan: PT plan of care cert/re-cert  Muscle weakness (generalized) - Plan: PT plan of care cert/re-cert  Other abnormalities of gait and mobility - Plan: PT plan of care cert/re-cert  Other lack of coordination - Plan: PT plan of care cert/re-cert  Unsteadiness on feet - Plan: PT plan of care cert/re-cert  Cerebrovascular accident (CVA), unspecified mechanism (HCC) - Plan: PT plan of care cert/re-cert  Rationale for Evaluation and Treatment: Rehabilitation  SUBJECTIVE:  SUBJECTIVE STATEMENT:  Going to urgent care after PT/OT today due to bleeding from ear maybe due to scratching the inside with q-tip. Otherwise OK. Would still like to make today last day.     PERTINENT HISTORY:  Per hospital PT evaluation note: 68 y.o. female presents to [facility] hospital on 07/30/2023 after a fall and shaking episode. 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 Additional punctate acute infarcts in right cerebellum, left temporal lobe, left occipital lobe, right occipital lobe, right parietal lobe, right frontal lobe, right thalamus, left frontal lobe. Pt  with bloody bowel movement and hematemesis on 12/24. PMH includes hepatic cirrhosis, gastric and splenic varices, sepsis.   PAIN:  Are you having pain? Yes: NPRS scale: 5/10  Pain location: L knee Pain description: Chronic Aggravating factors: Cold weather, too much walking. Relieving factors: Has knee brace which helps, was taking Allieve, but had to stop since stroke.  PRECAUTIONS: Fall and Other: L visual field cut  RED FLAGS: None   WEIGHT BEARING RESTRICTIONS: No  FALLS: Has patient fallen in last 6 months? Yes. Number of falls 1  LIVING ENVIRONMENT: Lives with: lives with their family Lives in: House/apartment Stairs: Yes: Internal: 18 steps; on left going up and External: 1 steps; none Has following equipment at home: Walker - 2 wheeled  PLOF: Independent  PATIENT GOALS: Patient would like to improve her balance, minimize knee pain, return her normal daily activities.  OBJECTIVE:  Note: Objective measures were completed at Evaluation unless otherwise noted.  DIAGNOSTIC FINDINGS: N/A  COGNITION: Overall cognitive status: Within functional limits for tasks assessed   SENSATION: Patient denies change  COORDINATION: Heel slide on shin, WNL  EDEMA:  Patient denies  MUSCLE TONE: WNL  MUSCLE LENGTH: Hamstrings: Right 75 deg; Left 60 deg Thomas test: WNL B.  POSTURE: increased thoracic kyphosis and weight shift right  LOWER EXTREMITY ROM:   WNL B.  LOWER EXTREMITY MMT:  R 5/5  MMT Left Eval Left 10/03/23  Hip flexion 4- 4+  Hip extension 4- 4+  Hip abduction 4- 4+  Hip adduction    Hip internal rotation    Hip external rotation    Knee flexion 4 4-  Knee extension 4 4+  Ankle dorsiflexion 4 4+  Ankle plantarflexion 4-   Ankle inversion    Ankle eversion    (Blank rows = not tested)  BED MOBILITY:  I  TRANSFERS: I  RAMP:  S  CURB:  CGA  STAIRS: Level of Assistance: SBA Stair Negotiation Technique: Step to Pattern with Single Rail on  Left Number of Stairs: 18  Height of Stairs: 6"  Comments: Patient reports she is stable with a rail  GAIT: Gait pattern: step through pattern, decreased arm swing- Right, decreased arm swing- Left, shuffling, and lateral hip instability Distance walked: in clinic distances Assistive device utilized: None Level of assistance: SBA Comments: Patient reports that she walks with her son or husband when outside of her house for safety.  FUNCTIONAL TESTS:  5 times sit to stand: 18.81; 10/03/23- 13.7 seconds hands on thighs  Berg Balance Scale: 39   09/20/23 = 47/56; 10/03/23 50/56 Functional gait assessment: 16; 10/03/23 25  TREATMENT DATE   10/03/23  Nustep L5x8 minutes all four extremities, w/u and neural priming  HS curls green TB x12  L LE Forward step ups 6 inch step x10 B  Tandem stance blue foam pad 3x30 seconds  Tandem gait in // bars x2 laps solid surface   Objective measures as above, goal review and education progress and HEP updates for DC             10/01/23  Nustep L5-6 all four extremities for 8 minutes for w/u, tissue perfusion, reciprocal movement    Standing 3 way hip 2x12 B red TB around knees  STS red TB around knees 2x12  Hip hikes x15 B Hip hikes + ABD x15 B   Standing on air pads 3x30 seconds One foot on blue air pad/one foot on BOSU 3x30 seconds B  Alternating toe taps to top of BOSU while standing on blue foam pad x20      09/25/23   Nustep L5x8 minutes all four extremities for w/u prior to exercise, tissue perfusion Standing on blue air pads x45 seconds Semi tandem stance on blue air pads 2x30 seconds, limited by knee pain   Supine marches red TB above thighs 2x12 B Sidelying hip ABD red TB x15 B STS with red TB above knees x10  3 way hip red TB above knees x10 B Hip hikes x12 B Hip hikes + ABD x10 B   Forward step ups 4 inch step x12 B         09/20/23 Bike Level 3 x 6 minutes On airex 5# pulls 2 x10 STS on airex On airex ball toss On airex volleyball4 Berg Balance test score:  47/56 On airex balance beam side stepping and tandem walking Rockerboard 2 ways reaching for numbers on wall, needed ModA for balance On airex cone toe touches Feet on ball K2C, rotation, posterior activation, isometric abs Passive stretch LE's Hooklying green tband clamshells Ball b/n knees squeeze  09/18/23  Nustep L5x8 minutes all four extremities for w/u, tissue perfusion STS red TB around knees x10  Forward step ups 4 inch step x10 B Lateral step ups 4 inch step x10 B    Tandem stance blue foam pad 3x30 seconds  Forward alternating toe taps to targets while standing on blue foam pad x20 Cross midline toe taps to targets while standing on blue foam pad x20  Standing on blue air pads shoulder width apart 3x30 seconds    Sidelying hip ABD red TB 2x12 B Supine marches red TB 2x12 B      PATIENT EDUCATION: Education details: POC Person educated: Patient Education method: Explanation Education comprehension: verbalized understanding  HOME EXERCISE PROGRAM:  Access Code: [email or web address] URL: [email or web address] Date: 10/03/2023 Prepared by: Nedra Hai  Exercises - Seated Hip Abduction with Resistance  - 1 x daily - 7 x weekly - 1-2 sets - 10 reps - 3 seconds hold - Sit to Stand  - 1 x daily - 7 x weekly - 1-2 sets - 5-10 reps - Side Stepping with Counter Support  - 1 x daily - 7 x weekly - 2-3 sets - 10 reps - Heel Raises with Counter Support  - 1 x daily - 7 x weekly - 1-2 sets - 10 reps - Standing March with Counter Support  - 1 x daily - 7 x weekly - 1-2 sets - 10 reps - Standing Hip Abduction with Counter Support  - 1 x daily - 7 x weekly -  1 sets - 10 reps - Standing 3-Way Leg Reach with Resistance at Ankles and Counter Support  - 1 x daily - 7 x weekly - 3  sets - 10 reps - Seated Knee Flexion with Anchored Resistance  - 1 x daily - 7 x weekly - 1 sets - 10 reps - 2 seconds  hold - Forward Step Up  - 1 x daily - 4 x weekly - 1 sets - 10 reps - Tandem Stance on Foam Pad with Eyes Open  - 1 x daily - 7 x weekly - 1 sets - 3 reps - Walking Tandem Stance  - 1 x daily - 7 x weekly - 1 sets - 5 reps  GOALS: Goals reviewed with patient? Yes  SHORT TERM GOALS: Target date: 09/04/23  I with initial HEP Baseline: Goal status: 08/28/23-met  LONG TERM GOALS: Target date: 10/10/23  I with final HEP Baseline:  Goal status: MET 10/03/23  2.  Complete 5 x STS without UE support in < 15 sec Baseline: 18.8, used UE Goal status: 09/06/23 14.25, no hands, much improved balance. Met  3.  Improve BERG score to at least 45/56 to indicate minimal fall risk Baseline: 39 Goal status: MET 50/56 10/03/23  4.  Improve FGA score to at least 24/28 Baseline: 16 Goal status: MET 10/03/23  5.  Increase L LE strength and trunk strength to at least 4+/5 to provide better support and help decrease knee pain. Baseline:  Goal status: MET 10/03/23 with exception of HS   6. Patient will walk at least 500' on level and unlevel surfaces I with no instability noted.  Baseline: Multiple gait abnormalities and limitations noted.  Goal Status: 10/03/23- PARTIALLY MET does still fatigue   ASSESSMENT:  CLINICAL IMPRESSION:  Focused on functional re-assessment of objective measures and goals due to request for DC today. Really made excellent progress with PT and goals are all met at this point. Updated HEP as appropriate today. Thank you for the referral!   OBJECTIVE IMPAIRMENTS: decreased activity tolerance, decreased balance, decreased coordination, decreased endurance, decreased mobility, difficulty walking, decreased strength, and postural dysfunction.   ACTIVITY LIMITATIONS: standing, squatting, stairs, and locomotion level  PARTICIPATION LIMITATIONS: meal prep,  cleaning, laundry, driving, shopping, and community activity  PERSONAL FACTORS: Past/current experiences are also affecting patient's functional outcome.   REHAB POTENTIAL: Good  CLINICAL DECISION MAKING: Stable/uncomplicated  EVALUATION COMPLEXITY: Moderate  PLAN:  PT FREQUENCY: 2x/week  PT DURATION: 10 weeks  PLANNED INTERVENTIONS: 97110-Therapeutic exercises, 97530- Therapeutic activity, 97112- Neuromuscular re-education, 97535- Self Care, [id number]- Manual therapy, [phone]- Gait training, [phone]- Electrical stimulation (unattended), Patient/Family education, Balance training, Stair training, and Joint mobilization  PLAN FOR NEXT SESSION: DC today thank you for the referral!   Nedra Hai, PT, DPT 10/03/23 11:08 AM
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
## 2023-10-04 ENCOUNTER — Ambulatory Visit
Admission: RE | Admit: 2023-10-04 | Discharge: 2023-10-04 | Disposition: A | Discharge status: Home / Self Care | Payer: Commercial Managed Care - PPO | Source: Ambulatory Visit | Attending: Family Medicine | Admitting: Family Medicine

## 2023-10-04 ENCOUNTER — Other Ambulatory Visit: Payer: Self-pay

## 2023-10-04 VITALS — BP 160/75 | HR 75 | Temp 98.5°F | Resp 16

## 2023-10-04 DIAGNOSIS — S00411A Abrasion of right ear, initial encounter: Secondary | ICD-10-CM

## 2023-10-04 MED ORDER — OFLOXACIN 0.3 % OT SOLN: 10.0000 [drp] | Freq: Every day | OTIC | 0 refills | Status: AC

## 2023-10-04 NOTE — Discharge Instructions (Addendum)
 Start antibiotic eardrops to the right ear daily for 7 days.  Keep water out of the ear until you are done with treatment.  Do not use any Q-tips or earbuds to that ear while symptoms persist.  Please follow-up with your PCP in 2 to 3 days for recheck.  Please go to the ER for any worsening symptoms.  Hope you feel better soon!

## 2023-10-04 NOTE — ED Provider Notes (Signed)
 [facility]    CSN: [id number] Arrival date & time: 10/04/23  1914      History   Chief Complaint Chief Complaint  Patient presents with   Ear Drainage    HPI Maria Rose is a 68 y.o. female presents for ear drainage.  Patient reports 3 days of some bloody drainage from her right ear.  States began after using a Q-tip but she does not remember any specific injury.  Denies any ear pain or hearing changes.  No cough or cold symptoms.  She is on Eliquis for history of CVA and this is a newer medication for her.  No OTC medications have been used for her symptoms.  No other concerns at this time.   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   Decompensated cirrhosis (HCC) 07/31/2023   Bleeding gastric varices 07/31/2023   Cerebrovascular accident (CVA) (HCC) 07/31/2023   Type 2 diabetes mellitus (HCC) 07/30/2023    Past Surgical History:  Procedure Laterality Date   ESOPHAGOGASTRODUODENOSCOPY (EGD) WITH PROPOFOL N/A 07/31/2023   Procedure: ESOPHAGOGASTRODUODENOSCOPY (EGD) WITH PROPOFOL;  Surgeon: Jenel Lucks, MD;  Location: [facility] ENDOSCOPY;  Service: Gastroenterology;  Laterality: N/A;   HEMOSTASIS CONTROL  07/31/2023   Procedure: HEMOSTASIS CONTROL;  Surgeon: Jenel Lucks, MD;  Location: [facility] ENDOSCOPY;  Service: Gastroenterology;;   IR ANGIOGRAM VISCERAL SELECTIVE  08/07/2023   IR ANGIOGRAM VISCERAL SELECTIVE  08/07/2023   IR EMBO VENOUS NOT HEMORR HEMANG  INC GUIDE ROADMAPPING  08/05/2023   IR RENAL SELECTIVE  UNI  INC S&I MOD SED  08/07/2023   IR US GUIDE VASC ACCESS RIGHT  08/05/2023   RADIOLOGY WITH ANESTHESIA N/A 08/05/2023   Procedure: IR WITH ANESTHESIA -TIPS;  Surgeon: Radiologist, Medication, MD;  Location: [facility] OR;  Service: Radiology;  Laterality: N/A;    OB History   No obstetric history on file.      Home Medications    Prior to Admission medications   Medication Sig Start Date End Date Taking? Authorizing Provider  ofloxacin (FLOXIN) 0.3 % OTIC solution Place 10 drops into the right ear daily for 7 days. 10/04/23 10/11/23 Yes Radford Pax, NP  apixaban (ELIQUIS) 5 MG TABS tablet Take 1 tablet (5 mg total) by mouth 2 (two) times daily. 08/12/23 09/11/23  Annett Fabian, MD  apixaban (ELIQUIS) 5 MG TABS tablet Take 1 tablet (5 mg total) by mouth 2 (two) times daily. 09/12/23 11/11/23  Annett Fabian, MD  Blood Glucose Monitoring Suppl DEVI 1 each by Does not apply route 3 (three) times daily. May dispense any manufacturer covered by patient's insurance. 08/12/23   Annett Fabian, MD  carvedilol (COREG) 3.125 MG tablet TAKE 1 TABLET BY MOUTH TWICE A DAY WITH A MEAL 09/14/23   Annett Fabian, MD  clotrimazole (LOTRIMIN) 1 % cream Apply to affected area 2 times daily 05/16/23   Renne Crigler, PA-C  erythromycin ophthalmic ointment Place 1 Application into the left eye 4 (four) times daily. 06/03/23   [provider]  ferrous sulfate 325 (65 FE) MG tablet Take 1 tablet (325 mg total) by mouth daily. 08/23/23 08/22/24  Philomena Doheny, MD  Glucagon, rDNA, (GLUCAGON EMERGENCY) 1 MG KIT Inject 1 mg into the skin as needed for up to 2 doses (Severe low blood sugar). 08/12/23   Annett Fabian, MD  Glucose Blood (BLOOD GLUCOSE TEST STRIPS) STRP 1 each by Does not apply route 3 (three) times daily. Use as directed to check blood sugar. May dispense any manufacturer covered by patient's insurance and fits patient's device. 08/12/23 11/10/23  Annett Fabian, MD  hydrocortisone cream 1 % Apply to  affected area 2 times daily for symptomatic relief 09/24/23 09/23/24  Annett Fabian, MD  Insulin Glargine [facility]) 100 UNIT/ML Inject 30 Units into the skin daily. May substitute as needed per insurance. 08/12/23 11/20/23  Annett Fabian, MD  Insulin Pen Needle (PEN NEEDLES) 31G X 5 MM MISC 1 each by Does not apply route 3 (three) times daily. May dispense any manufacturer covered by patient's insurance. 08/12/23   Annett Fabian, MD  Lancet Device MISC 1 each by Does not apply route 3 (three) times daily. May dispense any manufacturer covered by patient's insurance. 08/12/23   Annett Fabian, MD  Lancets MISC 1 each by Does not apply route 3 (three) times daily. Use as directed to check blood sugar. May dispense any manufacturer covered by patient's insurance and fits patient's device. 08/12/23   Annett Fabian, MD  mupirocin ointment (BACTROBAN) 2 % Apply 1 Application topically 2 (two) times daily. 06/26/23   [provider]  pantoprazole (PROTONIX) 40 MG tablet PLEASE SEE ATTACHED FOR DETAILED DIRECTIONS 09/03/23   Annett Fabian, MD    Family History Family History  Problem Relation Age of Onset   Hypertension Father     Social History Social History   Tobacco Use   Smoking status: Every Day   Smokeless tobacco: Never  Substance Use Topics   Alcohol use: Yes   Drug use: Never     Allergies   Patient has no known allergies.   Review of Systems Review of Systems  HENT:  Positive for ear discharge.      Physical Exam Triage Vital Signs ED Triage Vitals [10/04/23 0854]  Encounter Vitals Group     BP (!) 160/75     Systolic BP Percentile      Diastolic BP Percentile      Pulse Rate 75     Resp 16     Temp 98.5 F (36.9 C)     Temp Source Oral     SpO2 100 %     Weight      Height      Head Circumference      Peak Flow      Pain Score 0     Pain Loc      Pain Education      Exclude from Growth Chart    No data found.  Updated Vital Signs BP  (!) 160/75 (BP Location: Right Arm)   Pulse 75   Temp 98.5 F (36.9 C) (Oral)   Resp 16   SpO2 100%   Visual Acuity Right Eye Distance:   Left Eye Distance:   Bilateral Distance:    Right Eye Near:   Left Eye Near:    Bilateral Near:     Physical Exam Vitals and nursing note reviewed.  Constitutional:      General: She is not in acute distress.  Appearance: Normal appearance. She is not ill-appearing.  HENT:     Head: Normocephalic and atraumatic.     Right Ear: Drainage present. No mastoid tenderness. No hemotympanum. Tympanic membrane is not injected, perforated or erythematous.     Ears:     Comments: There is some dried bloody drainage as well as some fresh bright red bloody drainage to the right canal.  Small abrasion noted.  TM is intact Eyes:     Pupils: Pupils are equal, round, and reactive to light.  Cardiovascular:     Rate and Rhythm: Normal rate.  Pulmonary:     Effort: Pulmonary effort is normal.  Skin:    General: Skin is warm and dry.  Neurological:     General: No focal deficit present.     Mental Status: She is alert and oriented to person, place, and time.  Psychiatric:        Mood and Affect: Mood normal.        Behavior: Behavior normal.      UC Treatments / Results  Labs (all labs ordered are listed, but only abnormal results are displayed) Labs Reviewed - No data to display  EKG   Radiology No results found.  Procedures Procedures (including critical care time)  Medications Ordered in UC Medications - No data to display  Initial Impression / Assessment and Plan / UC Course  I have reviewed the triage vital signs and the nursing notes.  Pertinent labs & imaging results that were available during my care of the patient were reviewed by me and considered in my medical decision making (see chart for details).     Reviewed exam and symptoms with patient.  No red flags.  Will start ofloxacin antibiotic eardrops to prevent infection  due to the abrasion of the ear canal.  Advised her to not use any Q-tips or earbuds while symptoms persist.  She is also instructed to keep water out of the ear while undergoing treatment.  PCP follow-up if symptoms do not improve.  ER precautions reviewed and patient verbalized understanding. Final Clinical Impressions(s) / UC Diagnoses   Final diagnoses:  Abrasion of right ear canal, initial encounter     Discharge Instructions      Start antibiotic eardrops to the right ear daily for 7 days.  Keep water out of the ear until you are done with treatment.  Do not use any Q-tips or earbuds to that ear while symptoms persist.  Please follow-up with your PCP in 2 to 3 days for recheck.  Please go to the ER for any worsening symptoms.  Hope you feel better soon!    ED Prescriptions     Medication Sig Dispense Auth. Provider   ofloxacin (FLOXIN) 0.3 % OTIC solution Place 10 drops into the right ear daily for 7 days. 5 mL Radford Pax, NP      PDMP not reviewed this encounter.   Radford Pax, NP 10/04/23 [phone]

## 2023-10-04 NOTE — ED Triage Notes (Signed)
 Pt states she is having right ear bloody drainage for the past 3 days.

## 2023-10-11 ENCOUNTER — Other Ambulatory Visit: Payer: Self-pay | Admitting: Student

## 2023-10-11 ENCOUNTER — Telehealth: Payer: Self-pay | Admitting: Student

## 2023-10-11 MED ORDER — BASAGLAR KWIKPEN 100 UNIT/ML ~~LOC~~ SOPN: 33.0000 [IU] | PEN_INJECTOR | Freq: Every day | SUBCUTANEOUS | 1 refills | Status: DC

## 2023-10-11 MED ORDER — KETOCONAZOLE 2 % EX CREA: 1.0000 | TOPICAL_CREAM | Freq: Every day | CUTANEOUS | 0 refills | Status: DC

## 2023-10-11 NOTE — Telephone Encounter (Signed)
 Ketoconazole prescribed on 09/24/23 appears to be incomplete order, no pharmacy/routing information in chart to follow up with.  Copied from CRM [id number]. Topic: Clinical - Prescription Issue >> Oct 11, 2023  8:54 AM Irine Seal wrote: Reason for CRM: Patient was seen in office on 09/24/23 with Maria Rose. Prescription for ketoconazole (NIZORAL) 2% cream was sent as per chart but was not [facility] at [facility]  Insulin Glargine (BASAGLAR KWIKPEN) 100 UNIT/ML the patient has 3 pens remaining. Pharmacy informed the patient that the prescription was canceled. However, per the encounter notes, the plan was to increase the dosage to 33 units daily and discuss the possibility of canceling the insulin and transitioning to oral medication at the 63-month follow-up. It is unclear why the pharmacy is reporting the prescription as canceled, as it should not have been canceled yet.   Pharmacy  [id number] - [place], [place] - [address] AT [facility] [address], [place] [address]

## 2023-10-11 NOTE — Telephone Encounter (Signed)
 Will forward to Dr. Versie Starks to complete prescriptions

## 2023-10-13 ENCOUNTER — Other Ambulatory Visit: Payer: Self-pay | Admitting: Student

## 2023-10-17 NOTE — Progress Notes (Unsigned)
 PATIENT: Maria Rose DOB: [birth year]  REASON FOR VISIT: follow up HISTORY FROM: patient PRIMARY NEUROLOGIST: Dr. Pearlean Brownie  Chief Complaint  Patient presents with   Follow-up    Patient in room #20 and alone. Patient states she is well and stable, she is done with physical therapy and her vision got better. Patient states would like to discuss driving privilege and when she can drive.     HISTORY OF PRESENT ILLNESS: Today 10/17/23  Maria Rose is a 68 y.o. 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  Returns today for follow-up.  She reports that since her stroke she feels that she is back to her normal.  She states that she was originally having blurry vision in the left eye but that has improved.  She will be making an appointment with her eye doctor to follow-up.  She states that her hemoglobin A1c was recently checked and it had decreased to 6.9.  Currently not on any medication for cholesterol as her LDL has been in normal range.  Blood pressure today is in normal range.  She did wear a cardiac monitor that did not show A-fib.  She did have echocardiogram with bubble study that did not show endocarditis.  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 overall she feels that she is doing well.  She returns today for an evaluation.    CTA head and neck: IMPRESSION: 1. No large vessel occlusion. 2. Intracranial atherosclerosis with mild bilateral ICA stenoses. 3. Cervical carotid atherosclerosis without significant stenosis. 4.  Aortic Atherosclerosis (ICD10-I70.0).  MRI brain: IMPRESSION: 1. Acute to early subacute infarct in the right posterior temporal and lateral occipital and parietal cortex, with associated edema and petechial hemorrhage. 2. Additional punctate acute infarcts in the right cerebellum, left temporal lobe, left occipital lobe, right occipital lobe, right parietal lobe, right frontal lobe, right thalamus, and  left frontal lobe. These foci are both in the white matter and cortex. The distribution is suggestive of a central embolic phenomenon.  HISTORY Maria Rose is a 68 y.o. female with history of nasal cellulitis on Abx admitted for SOB, syncope, hyperglycemia, speech difficulty, AMS with elevated lactic acid. No tPA given due to outside window.     Stroke:  bilateral embolic shower, embolic pattern, etiology unclear, needs to rule out endocarditis CT right parietooccipital infarct CTA head and neck mild bilateral ICA stenoses.  MRI  Acute to early subacute infarct in the right posterior temporal and lateral occipital and parietal cortex, with associated edema and petechial hemorrhage. Additional punctate acute infarcts in the right cerebellum, left temporal lobe, left occipital lobe, right occipital lobe, right parietal lobe, right frontal lobe, right thalamus, and left frontal lobe. These foci are both in the white matter and cortex. 2D Echo  EF 60-65% recommend TEE to rule out endocarditis and to evaluate cardioembolic source LDL 63 HgbA1c 29.5 SCDs for VTE prophylaxis No antithrombotic prior to admission, now on No antithrombotic with severe anemia and thrombocytopenia, and needs to rule out endocarditis Ongoing aggressive stroke risk factor management Therapy recommendations:  CIR Disposition:  pending    REVIEW OF SYSTEMS: Out of a complete 14 system review of symptoms, the patient complains only of the following symptoms, and all other reviewed systems are negative.  ALLERGIES: No Known Allergies  HOME MEDICATIONS: Outpatient Medications Prior to Visit  Medication Sig Dispense Refill   apixaban (ELIQUIS) 5 MG TABS tablet Take 1 tablet (  5 mg total) by mouth 2 (two) times daily. 60 tablet 0   apixaban (ELIQUIS) 5 MG TABS tablet Take 1 tablet (5 mg total) by mouth 2 (two) times daily. 120 tablet 0   Blood Glucose Monitoring Suppl DEVI 1 each by Does not apply route 3 (three)  times daily. May dispense any manufacturer covered by patient's insurance. 1 each 2   carvedilol (COREG) 3.125 MG tablet TAKE 1 TABLET BY MOUTH TWICE A DAY WITH A MEAL 60 tablet 1   clotrimazole (LOTRIMIN) 1 % cream Apply to affected area 2 times daily 60 g 1   erythromycin ophthalmic ointment Place 1 Application into the left eye 4 (four) times daily.     ferrous sulfate 325 (65 FE) MG tablet Take 1 tablet (325 mg total) by mouth daily. 30 tablet 2   Glucagon, rDNA, (GLUCAGON EMERGENCY) 1 MG KIT Inject 1 mg into the skin as needed for up to 2 doses (Severe low blood sugar). 1 kit 1   Glucose Blood (BLOOD GLUCOSE TEST STRIPS) STRP 1 each by Does not apply route 3 (three) times daily. Use as directed to check blood sugar. May dispense any manufacturer covered by patient's insurance and fits patient's device. 90 each 2   hydrocortisone cream 1 % Apply to affected area 2 times daily for symptomatic relief 30 g 1   insulin degludec (TRESIBA) 100 UNIT/ML FlexTouch Pen Inject 33 Units into the skin daily. 3 mL 11   Insulin Pen Needle (PEN NEEDLES) 31G X 5 MM MISC 1 each by Does not apply route 3 (three) times daily. May dispense any manufacturer covered by patient's insurance. 90 each 3   ketoconazole (NIZORAL) 2 % cream Apply 1 Application topically daily. 15 g 0   Lancet Device MISC 1 each by Does not apply route 3 (three) times daily. May dispense any manufacturer covered by patient's insurance. 1 each 3   Lancets MISC 1 each by Does not apply route 3 (three) times daily. Use as directed to check blood sugar. May dispense any manufacturer covered by patient's insurance and fits patient's device. 1 each 2   mupirocin ointment (BACTROBAN) 2 % Apply 1 Application topically 2 (two) times daily.     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    PAST SURGICAL HISTORY: Past Surgical History:  Procedure Laterality Date   ESOPHAGOGASTRODUODENOSCOPY (EGD) WITH PROPOFOL N/A 07/31/2023   Procedure: ESOPHAGOGASTRODUODENOSCOPY (EGD) WITH PROPOFOL;  Surgeon: Jenel Lucks, MD;  [facility] ENDOSCOPY;  Service: Gastroenterology;  Laterality: N/A;   HEMOSTASIS CONTROL  07/31/2023   Procedure: HEMOSTASIS CONTROL;  Surgeon: Jenel Lucks, MD;  Location: [facility] ENDOSCOPY;  Service: Gastroenterology;;   IR ANGIOGRAM VISCERAL SELECTIVE  08/07/2023   IR ANGIOGRAM VISCERAL SELECTIVE  08/07/2023   IR EMBO VENOUS NOT HEMORR HEMANG  INC GUIDE ROADMAPPING  08/05/2023   IR RENAL SELECTIVE  UNI INC S&I MOD SED  08/07/2023   IR US GUIDE VASC ACCESS RIGHT  08/05/2023   RADIOLOGY WITH ANESTHESIA  N/A 08/05/2023   Procedure: IR WITH ANESTHESIA -TIPS;  Surgeon: Radiologist, Medication, MD;  Location: [facility] OR;  Service: Radiology;  Laterality: N/A;    FAMILY HISTORY: Family History  Problem Relation Age of Onset   Hypertension Father     SOCIAL HISTORY: Social History   Socioeconomic History   Marital status: Single    Spouse name: Not on file   Number of children: Not on file   Years of education: Not on file   Highest education level: Not on file  Occupational History   Not on file  Tobacco Use   Smoking status: Every Day   Smokeless tobacco: Never  Substance and Sexual Activity   Alcohol use: Yes   Drug use: Never   Sexual activity: Not Currently  Other Topics Concern   Not on file  Social History Narrative   Not on file   Social Drivers of Health   Financial Resource Strain: Not on file  Food Insecurity: No Food Insecurity (07/30/2023)   Hunger Vital  Sign    Worried About Running Out of Food in the Last Year: Never true    Ran Out of Food in the Last Year: Never true  Transportation Needs: No Transportation Needs (07/30/2023)   PRAPARE - Administrator, Civil Service (Medical): No    Lack of Transportation (Non-Medical): No  Physical Activity: Not on file  Stress: Not on file  Social Connections: Unknown (05/14/2023)   [facility]   Social Network    Social Network: Not on file  Intimate Partner Violence: Not At Risk (07/30/2023)   Humiliation, Afraid, Rape, and Kick questionnaire    Fear of Current or Ex-Partner: No    Emotionally Abused: No    Physically Abused: No    Sexually Abused: No      PHYSICAL EXAM  Vitals:   10/18/23 0847  BP: 136/68  Pulse: 65  Weight: 185 lb (83.9 kg)  Height: 5\' 6"  (1.676 m)   Body mass index is 29.86 kg/m.  Generalized: Well developed, in no acute distress   Neurological examination  Mentation: Alert oriented to time, place, history taking. Follows all commands speech and language fluent Cranial nerve II-XII: Pupils were equal round reactive to light. Extraocular movements were full, visual field were full on confrontational test. Facial sensation and strength were normal. Uvula tongue midline. Head turning and shoulder shrug  were normal and symmetric. Motor: The motor testing reveals 5 over 5 strength of all 4 extremities. Good symmetric motor tone is noted throughout.  Sensory: Sensory testing is intact to soft touch on all 4 extremities. No evidence of extinction is noted.  Coordination: Cerebellar testing reveals good finger-nose-finger and heel-to-shin bilaterally.  Gait and station: Gait is normal.    DIAGNOSTIC DATA (LABS, IMAGING, TESTING) - I reviewed patient records, labs, notes, testing and imaging myself where available.  Lab Results  Component Value Date   WBC 7.3 08/22/2023   HGB 10.4 (L) 08/22/2023   HCT 34.2 08/22/2023   MCV 75 (L)  08/22/2023   PLT 134 (L) 08/22/2023      Component Value Date/Time   NA 136 08/09/2023 1031   K 3.6 08/09/2023 1031   CL 102 08/09/2023 1031   CO2 23 08/09/2023 1031   GLUCOSE 223 (H) 08/09/2023 1031   BUN 10 08/09/2023 1031   CREATININE 0.61 08/09/2023 1031   CALCIUM 9.1 08/09/2023 1031   PROT 6.2 (L) 08/06/2023 0450   ALBUMIN 3.4 (L)  08/06/2023 0450   AST 30 08/06/2023 0450   ALT 33 08/06/2023 0450   ALKPHOS 52 08/06/2023 0450   BILITOT 0.8 08/06/2023 0450   GFRNONAA >60 08/09/2023 1031   Lab Results  Component Value Date   CHOL 104 07/31/2023   HDL 23 (L) 07/31/2023   LDLCALC 63 07/31/2023   TRIG 89 07/31/2023   CHOLHDL 4.5 07/31/2023   Lab Results  Component Value Date   HGBA1C 6.9 (A) 08/22/2023   No results found for: "VITAMINB12" Lab Results  Component Value Date   TSH 3.586 07/31/2023      ASSESSMENT AND PLAN 68 y.o. year old female  has a past medical history of Arthritis, Diabetes mellitus without complication (HCC), GI bleed (07/31/2023), and Splenic varices (07/31/2023). 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    Continue Eliquis (apixaban) daily as directed by IR.  Did advise that if Eliquis is discontinued we would need to consider adding on a different blood thinner such as aspirin or Plavix.   Discussed secondary stroke prevention measures and importance of close PCP follow up for aggressive stroke risk factor management. I have gone over the pathophysiology of stroke, warning signs and symptoms, risk factors and their management in some detail with instructions to go to the closest emergency room for symptoms of concern. HTN: BP goal <130/90.   HLD: LDL goal <70. Recent LDL 63.  DMII: A1c goal<7.0. Recent A1c 6.9.  Encouraged patient to monitor diet and encouraged exercise FU with our office 6 months or sooner if needed      Butch Penny, MSN, NP-C 10/17/2023, 5:53 PM [facility] [address] [place], [place] [address] [phone]

## 2023-10-18 ENCOUNTER — Ambulatory Visit: Payer: Medicare Other | Admitting: Adult Health

## 2023-10-18 ENCOUNTER — Encounter: Payer: Self-pay | Admitting: Adult Health

## 2023-10-18 VITALS — BP 136/68 | HR 65 | Ht 66.0 in | Wt 185.0 lb

## 2023-10-18 DIAGNOSIS — Z794 Long term (current) use of insulin: Secondary | ICD-10-CM

## 2023-10-18 DIAGNOSIS — E1100 Type 2 diabetes mellitus with hyperosmolarity without nonketotic hyperglycemic-hyperosmolar coma (NKHHC): Secondary | ICD-10-CM

## 2023-10-18 DIAGNOSIS — I639 Cerebral infarction, unspecified: Secondary | ICD-10-CM | POA: Diagnosis not present

## 2023-10-18 DIAGNOSIS — K55069 Acute infarction of intestine, part and extent unspecified: Secondary | ICD-10-CM | POA: Diagnosis not present

## 2023-10-18 NOTE — Progress Notes (Signed)
 Micki Riley, MD  Butch Penny, NP Agree stroke etio is crptogenic and if eleiquis stopped can go on aspirin       Previous Messages    ----- Message ----- From: Butch Penny, NP Sent: 10/18/2023   9:35 AM EDT To: Micki Riley, MD  Please look over this chart.  This patient has superior mesenteric vein mural thrombus and for that reason was placed on Eliquis.  She has an appointment later this month with IR.  If she is ever taken off Eliquis would you recommend that she go on aspirin or Plavix?  Also, cardiac workup has been unremarkable.  Is there any additional workup that you recommend in regards to the etiology of her stroke

## 2023-10-18 NOTE — Patient Instructions (Signed)
 Your Plan:  Continue Eliquis   Blood pressure goal <130/90 Cholesterol LDL goal <70 Diabetes goal A1c <7 Monitor diet and try to exercise   Thank you for coming to see [place] at [facility]. I hope we have been able to provide you high quality care today.  You may receive a patient satisfaction survey over the next few weeks. We would appreciate your feedback and comments so that we may continue to improve ourselves and the health of our patients.

## 2023-10-19 ENCOUNTER — Other Ambulatory Visit: Payer: Self-pay | Admitting: Student

## 2023-10-19 MED ORDER — INSULIN PEN NEEDLE 32G X 4 MM MISC: 1.0000 | Freq: Every day | 11 refills | Status: DC

## 2023-10-22 ENCOUNTER — Telehealth: Payer: Self-pay | Admitting: *Deleted

## 2023-10-22 NOTE — Telephone Encounter (Signed)
 Call from patient was recently started  on Tresiba.  Needs the needles for the Flex Pen .  Patient is requesting that the prescription go to the [facility] on [address].  Patient was also asking how long she will be on the [place] as well as why she only go a 15 day supply.

## 2023-10-23 ENCOUNTER — Other Ambulatory Visit: Payer: Self-pay | Admitting: Student

## 2023-10-23 MED ORDER — INSULIN PEN NEEDLE 32G X 4 MM MISC: 1.0000 | Freq: Every day | 11 refills | Status: DC

## 2023-10-23 MED ORDER — INSULIN DEGLUDEC 100 UNIT/ML ~~LOC~~ SOPN: 33.0000 [IU] | PEN_INJECTOR | Freq: Every day | SUBCUTANEOUS | 11 refills | Status: DC

## 2023-11-07 ENCOUNTER — Other Ambulatory Visit: Payer: Self-pay | Admitting: Interventional Radiology

## 2023-11-07 DIAGNOSIS — I864 Gastric varices: Secondary | ICD-10-CM

## 2023-11-11 NOTE — Progress Notes (Signed)
 I agree with the above plan

## 2023-11-13 NOTE — Progress Notes (Signed)
 " Cardiology Office Note:  .   Date:  11/14/2023  ID:  Maria Rose, DOB August 07, 1956, MRN [id number] PCP: Gregary Sharper, MD  [facility] Providers Cardiologist:  Soyla DELENA Merck, MD    History of Present Illness: .   Maria Rose is a 68 y.o. female.  Discussed the use of AI scribe software for clinical note transcription with the patient, who gave verbal consent to proceed.  History of Present Illness The patient, with a history of diabetes, anemia, thrombocytopenia, and low ferritin levels, presented for a follow-up visit after a stroke. The stroke was believed to be embolic and cryptogenic, and the patient has been on eliquis  since the event. The patient also has a history of varices and cirrhosis. The patient is currently on Eliquis , but there is a discussion about possibly changing to Plavix due to the cost of Eliquis  per neurology. The patient's cholesterol levels are well managed, and the patient has lost significant weight recently. The patient is a former smoker and quit about a year ago. 50 years.  The patient also mentioned having a history of varices bleeding, which occurred during a hospital stay.    ROS: negative except per HPI above.  Studies Reviewed: SABRA   EKG Interpretation Date/Time:  Wednesday November 14 2023 13:57:56 EDT Ventricular Rate:  76 PR Interval:  130 QRS Duration:  90 QT Interval:  400 QTC Calculation: 450 R Axis:   -11  Text Interpretation: Normal sinus rhythm with sinus arrhythmia Possible Inferior infarct , age undetermined When compared with ECG of 30-Jul-2023 11:30, Vent. rate has decreased BY  39 BPM Minimal criteria for Anterior infarct are no longer Present Borderline criteria for Inferior infarct are now Present ST no longer depressed in Inferior leads Confirmed by Merck Soyla ([id number]) on 11/14/2023 2:34:36 PM    Results LABS A1c: 6.9% Hb: 10.4 g/dL (98/84/7974) PLT: 865 k89^0/O (08/22/2023) Ferritin: 19 ng/mL Iron  saturation:  5% Lipid panel: LDL 63 mg/dL, HDL 23 mg/dL, Triglycerides 89 mg/dL (87/7975)  RADIOLOGY CT scan: Superior mesenteric vein mural thrombus, aortic atherosclerosis with plaque and calcification  DIAGNOSTIC Cardiac monitor: No atrial fibrillation Transthoracic echocardiogram: EF 60-65%, suboptimal bubble study, no definite shunting, otherwise grossly normal (07/31/2019) Risk Assessment/Calculations:        Physical Exam:   VS:  BP 128/72 (BP Location: Right Arm, Patient Position: Sitting, Cuff Size: Normal)   Pulse 77   Ht 5' 6.5 (1.689 m)   Wt 182 lb (82.6 kg)   SpO2 93%   BMI 28.94 kg/m    Wt Readings from Last 3 Encounters:  11/14/23 182 lb (82.6 kg)  10/18/23 185 lb (83.9 kg)  09/24/23 186 lb 6.4 oz (84.6 kg)     Physical Exam GENERAL: Alert, cooperative, well developed, no acute distress. HEENT: Normocephalic, normal oropharynx, moist mucous membranes. CHEST: Clear to auscultation bilaterally, no wheezes, rhonchi, or crackles. CARDIOVASCULAR: Normal heart rate and rhythm, S1 and S2 normal without murmurs. ABDOMEN: Soft, non-tender, non-distended, without organomegaly, normal bowel sounds. EXTREMITIES: No cyanosis or edema. NEUROLOGICAL: Cranial nerves grossly intact, moves all extremities without gross motor or sensory deficit.   ASSESSMENT AND PLAN: .    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 Neurology recommended aspirin. TEE deferred due to varices. Risk for atrial fibrillation due to cirrhosis and varices. No atrial fibrillation detected. Discussed loop recorder for atrial fibrillation monitoring. She agreed to loop recorder placement. - Refer to electrophysiology for loop  recorder placement. - Order echocardiogram with bubble study in July to revisit and get better bubble study. - Continue eliquis  therapy until advised otherwise by neurology.  Aortic Atherosclerosis Medication management Aortic  arch and descending aorta atherosclerosis with plaque and calcium . Not on cholesterol medication. Discussed atorvastatin  to stabilize plaque and reduce stroke risk. Explained potential myalgia and remedies. She agreed to start atorvastatin . - Prescribe atorvastatin  10 mg daily.  Cirrhosis with Varices Cirrhosis with varices increases atrial fibrillation risk and complicates anticoagulation. TEE deferred due to contraindication. Will await direction from GI if this can be pursued in future.  Diabetes Mellitus Diabetes managed with A1c of 6.9%. Diet and glucose monitoring ongoing. - Continue current diabetes management and monitoring.  Anemia and Thrombocytopenia Iron  deficiency Anemia and thrombocytopenia with low hemoglobin and platelet count. Iron  deficiency suggested by low ferritin and iron  saturation. Discussed monitoring and coordination with primary care. - Monitor blood counts and coordinate with primary care.  Follow-up Discussed importance of stress management and support systems. - Schedule follow-up after echocardiogram in July. - Coordinate care with primary care and specialists.      Soyla Merck, MD, [facility] "

## 2023-11-14 ENCOUNTER — Ambulatory Visit: Payer: Commercial Managed Care - PPO | Attending: Internal Medicine | Admitting: Internal Medicine

## 2023-11-14 VITALS — BP 128/72 | HR 77 | Ht 66.5 in | Wt 182.0 lb

## 2023-11-14 DIAGNOSIS — I700 Atherosclerosis of aorta: Secondary | ICD-10-CM | POA: Diagnosis not present

## 2023-11-14 DIAGNOSIS — K7460 Unspecified cirrhosis of liver: Secondary | ICD-10-CM

## 2023-11-14 DIAGNOSIS — Z79899 Other long term (current) drug therapy: Secondary | ICD-10-CM | POA: Diagnosis not present

## 2023-11-14 DIAGNOSIS — K7290 Hepatic failure, unspecified without coma: Secondary | ICD-10-CM

## 2023-11-14 DIAGNOSIS — I639 Cerebral infarction, unspecified: Secondary | ICD-10-CM | POA: Diagnosis not present

## 2023-11-14 DIAGNOSIS — I864 Gastric varices: Secondary | ICD-10-CM | POA: Diagnosis not present

## 2023-11-14 DIAGNOSIS — E1100 Type 2 diabetes mellitus with hyperosmolarity without nonketotic hyperglycemic-hyperosmolar coma (NKHHC): Secondary | ICD-10-CM

## 2023-11-14 DIAGNOSIS — K55069 Acute infarction of intestine, part and extent unspecified: Secondary | ICD-10-CM

## 2023-11-14 DIAGNOSIS — D509 Iron deficiency anemia, unspecified: Secondary | ICD-10-CM

## 2023-11-14 MED ORDER — ATORVASTATIN CALCIUM 10 MG PO TABS
10.0000 mg | ORAL_TABLET | Freq: Every day | ORAL | 3 refills | Status: DC
Start: 2023-11-14 — End: 2024-03-11

## 2023-11-14 NOTE — Patient Instructions (Addendum)
 Medication Instructions:  Start: Atorvastatin (Lipitor) 10 mg (one tablet) once daily  Lab Work: None  Testing/Procedures: Your physician has requested that you have an Echocardiogram Complete Bubble Study in July 2025. Echocardiography is a painless test that uses sound waves to create images of your heart. It provides your doctor with information about the size and shape of your heart and how well your heart's chambers and valves are working. This procedure takes approximately one hour. There are no restrictions for this procedure. Please do NOT wear cologne, perfume, aftershave, or lotions (deodorant is allowed). Please arrive 15 minutes prior to your appointment time.  Please note: We ask at that you not bring children with you during ultrasound (echo/ vascular) testing. Due to room size and safety concerns, children are not allowed in the ultrasound rooms during exams. Our front office staff cannot provide observation of children in our lobby area while testing is being conducted. An adult accompanying a patient to their appointment will only be allowed in the ultrasound room at the discretion of the ultrasound technician under special circumstances. We apologize for any inconvenience.   Follow-Up: At [facility], you and your health needs are our priority.  As part of our continuing mission to provide you with exceptional heart care, our providers are all part of one team.  This team includes your primary Cardiologist (physician) and Advanced Practice Providers or APPs (Physician Assistants and Nurse Practitioners) who all work together to provide you with the care you need, when you need it.  Your next appointment:   3 month(s)-4 months (after Echo in July 2025)  Provider:   Parke Poisson, MD or available APP     Other Instructions Please call us or send a MyChart message with any Cardiology related questions/concerns.  [phone].  Thank you!        Valet parking  services will be available as well.

## 2023-11-20 ENCOUNTER — Other Ambulatory Visit: Payer: Self-pay | Admitting: Student

## 2023-11-20 ENCOUNTER — Ambulatory Visit (HOSPITAL_COMMUNITY)
Admission: RE | Admit: 2023-11-20 | Discharge: 2023-11-20 | Disposition: A | Discharge status: Home / Self Care | Source: Ambulatory Visit | Attending: Radiology | Admitting: Radiology

## 2023-11-20 DIAGNOSIS — I864 Gastric varices: Secondary | ICD-10-CM | POA: Diagnosis present

## 2023-11-20 DIAGNOSIS — E1100 Type 2 diabetes mellitus with hyperosmolarity without nonketotic hyperglycemic-hyperosmolar coma (NKHHC): Secondary | ICD-10-CM | POA: Diagnosis present

## 2023-11-20 LAB — POCT I-STAT CREATININE: Creatinine, Ser: 0.6 mg/dL (ref 0.44–1.00)

## 2023-11-20 MED ORDER — IOHEXOL 350 MG/ML SOLN
100.0000 mL | Freq: Once | INTRAVENOUS | Status: AC | PRN
  Administered 2023-11-20: 100 mL via INTRAVENOUS

## 2023-11-20 MED ORDER — SODIUM CHLORIDE (PF) 0.9 % IJ SOLN
INTRAMUSCULAR | Status: AC
  Filled 2023-11-20: qty 50

## 2023-11-20 NOTE — Telephone Encounter (Signed)
 Medication sent to pharmacy

## 2023-11-22 LAB — HM DIABETES EYE EXAM

## 2023-11-26 ENCOUNTER — Ambulatory Visit (INDEPENDENT_AMBULATORY_CARE_PROVIDER_SITE_OTHER): Payer: Commercial Managed Care - PPO | Admitting: Internal Medicine

## 2023-11-26 VITALS — BP 148/82 | HR 69 | Temp 98.4°F | Ht 66.5 in | Wt 181.6 lb

## 2023-11-26 DIAGNOSIS — R030 Elevated blood-pressure reading, without diagnosis of hypertension: Secondary | ICD-10-CM | POA: Diagnosis not present

## 2023-11-26 DIAGNOSIS — D509 Iron deficiency anemia, unspecified: Secondary | ICD-10-CM

## 2023-11-26 DIAGNOSIS — Z794 Long term (current) use of insulin: Secondary | ICD-10-CM | POA: Diagnosis not present

## 2023-11-26 DIAGNOSIS — I8290 Acute embolism and thrombosis of unspecified vein: Secondary | ICD-10-CM

## 2023-11-26 DIAGNOSIS — L219 Seborrheic dermatitis, unspecified: Secondary | ICD-10-CM

## 2023-11-26 DIAGNOSIS — Z7984 Long term (current) use of oral hypoglycemic drugs: Secondary | ICD-10-CM

## 2023-11-26 DIAGNOSIS — E1169 Type 2 diabetes mellitus with other specified complication: Secondary | ICD-10-CM

## 2023-11-26 DIAGNOSIS — Z13820 Encounter for screening for osteoporosis: Secondary | ICD-10-CM

## 2023-11-26 DIAGNOSIS — Z1231 Encounter for screening mammogram for malignant neoplasm of breast: Secondary | ICD-10-CM

## 2023-11-26 LAB — POCT GLYCOSYLATED HEMOGLOBIN (HGB A1C): Hemoglobin A1C: 6.8 % — AB (ref 4.0–5.6)

## 2023-11-26 LAB — GLUCOSE, CAPILLARY: Glucose-Capillary: 127 mg/dL — ABNORMAL HIGH (ref 70–99)

## 2023-11-26 MED ORDER — INSULIN DEGLUDEC 100 UNIT/ML ~~LOC~~ SOPN: 28.0000 [IU] | PEN_INJECTOR | Freq: Every day | SUBCUTANEOUS | 11 refills | Status: DC

## 2023-11-26 MED ORDER — DAPAGLIFLOZIN PROPANEDIOL 5 MG PO TABS: 5.0000 mg | ORAL_TABLET | Freq: Every day | ORAL | 3 refills | Status: DC

## 2023-11-26 NOTE — Patient Instructions (Signed)
 Thank you, Ms.Vincent Greek for allowing us  to provide your care today.   Diabetes Your A1c today is well controlled. I do not feel comfortable with completely taking you off insulin  yet. I am decreasing this from 33 units to 28 units. Please start farxiga  5 mg daily. We will work towards coming off of insulin  by starting pill medications. This medication can be expensive. Please call clinic if cost is high. Follow-up in 1 month.  Iron lab work I am checking your blood count and iron studies today. Please be sure to follow-up with GI in June as they will likely want to repeat and endoscopy to look at the blood vessels in your throat.  Your blood pressure was a little elevated today. Please come back in 4 weeks to follow-up on that.  [facility]--Grief Counseling  Adults Adults and families of hospice patients and community members are welcome to join us  for individual or group grief support. All services are offered free of charge and are not limited to those with a family member or friend in hospice care.  Grief Counseling Groups and Events Support groups, led by the [facility] and [facility] [employer] [facility] bereavement team, provide a safe, supportive environment where concerns, fears, and questions can be discussed. Our grief support events are available to families of hospice patients, as well as anyone in the community who has suffered a loss.   Call the Grief Counseling Center in [place] at [phone] or in [place] at [phone] during regular business hours and ask to speak with our bereavement team.     I have ordered the following labs for you:  Lab Orders         Iron, TIBC and Ferritin Panel         CBC no Diff         Glucose, capillary         POC Hbg A1C       I have ordered the following medication/changed the following medications:   Stop the following medications: Medications Discontinued During This Encounter  Medication Reason    insulin  degludec (TRESIBA ) 100 UNIT/ML FlexTouch Pen      Start the following medications: Meds ordered this encounter  Medications   dapagliflozin  propanediol (FARXIGA ) 5 MG TABS tablet    Sig: Take 1 tablet (5 mg total) by mouth daily before breakfast.    Dispense:  30 tablet    Refill:  3   insulin  degludec (TRESIBA ) 100 UNIT/ML FlexTouch Pen    Sig: Inject 28 Units into the skin daily.    Dispense:  3 mL    Refill:  11     Follow up:  4 weeks    We look forward to seeing you next time. Please call our clinic at [phone] if you have any questions or concerns. The best time to call is Monday-Friday from 9am-4pm, but there is someone available 24/7. If after hours or the weekend, call the main hospital number and ask for the Internal Medicine Resident On-Call. If you need medication refills, please notify your pharmacy one week in advance and they will send us  a request.   Thank you for trusting me with your care. Wishing you the best!   Karalee Oscar, DO [facility]

## 2023-11-26 NOTE — Progress Notes (Unsigned)
 Subjective:  CC: diabetes follow-up  HPI:  [name] is a 68 y.o. female with a past medical history of cirrhosis, type 2 diabetes, iron deficiency anemai, history of CVA who presents today for follow-up.  She was diagnosed with cirrhosis, diabetes, and IDA during a hospital admission in 12/24 for an embolic stroke. 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 Neurology is considering changing her anticoagulation depending on if IR is able to offer an intervention for this thrombus. She has follow-up with IR 4/22.     She is frustrated with having to inject insulin  as she does not like needles so her son is the only one who has learned to inject insulin . This restricts her travel as she would really like to go visit her sister.  Please see problem based assessment and plan for additional details.  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    MEDICATIONS:  Coreg  3.125 mg BID Iron tablets Atorvastatin  10 mg Tresiba  33 units every day Eliquis  5 mg bid  Family History  Problem Relation Age of Onset   Hypertension Father     Past Surgical History:  Procedure Laterality Date   ESOPHAGOGASTRODUODENOSCOPY (EGD) WITH PROPOFOL  N/A 07/31/2023   Procedure: ESOPHAGOGASTRODUODENOSCOPY (EGD) WITH PROPOFOL ;  Surgeon: Elois Hair, MD;  Location: [facility] ENDOSCOPY;  Service: Gastroenterology;  Laterality: N/A;   HEMOSTASIS CONTROL  07/31/2023   Procedure: HEMOSTASIS CONTROL;  Surgeon: Elois Hair, MD;  Location: [facility] ENDOSCOPY;  Service: Gastroenterology;;   IR ANGIOGRAM VISCERAL SELECTIVE  08/07/2023   IR ANGIOGRAM VISCERAL SELECTIVE  08/07/2023   IR EMBO VENOUS NOT HEMORR HEMANG  INC GUIDE ROADMAPPING  08/05/2023   IR RADIOLOGIST EVAL & MGMT  11/27/2023   IR RENAL SELECTIVE  UNI INC S&I MOD SED   08/07/2023   IR US  GUIDE VASC ACCESS RIGHT  08/05/2023   RADIOLOGY WITH ANESTHESIA N/A 08/05/2023   Procedure: IR WITH ANESTHESIA -TIPS;  Surgeon: Radiologist, Medication, MD;  Location: [facility] OR;  Service: Radiology;  Laterality: N/A;     Social History   Socioeconomic History   Marital status: Married    Spouse name: Not on file   Number of children: Not on file   Years of education: Not on file   Highest education level: Not on file  Occupational History   Not on file  Tobacco Use   Smoking status: Every Day   Smokeless tobacco: Never  Substance and Sexual Activity   Alcohol use: Yes   Drug use: Never   Sexual activity: Not Currently  Other Topics Concern   Not on file  Social History Narrative   Not on file   Social Drivers of Health   Financial Resource Strain: Not on file  Food Insecurity: No Food Insecurity (07/30/2023)   Hunger Vital Sign    Worried About Running Out of Food in the Last Year: Never true    Ran Out of Food in the Last Year: Never true  Transportation Needs: No Transportation Needs (07/30/2023)   PRAPARE - Administrator, Civil Service (Medical): No    Lack of Transportation (Non-Medical): No  Physical Activity: Not on file  Stress: Not on file  Social Connections: Unknown (05/14/2023)   Received from [facility]   Social Network  Social Network: Not on file  Intimate Partner Violence: Not At Risk (07/30/2023)   Humiliation, Afraid, Rape, and Kick questionnaire    Fear of Current or Ex-Partner: No    Emotionally Abused: No    Physically Abused: No    Sexually Abused: No    Review of Systems: ROS negative except for what is noted on the assessment and plan.  Objective:   Vitals:   11/26/23 1013 11/26/23 1106  BP: (!) 167/85 (!) 148/82  Pulse: 74 69  Temp: 98.4 F (36.9 C)   TempSrc: Oral   SpO2: 96%   Weight: 181 lb 9.6 oz (82.4 kg)   Height: 5' 6.5" (1.689 m)     Physical Exam: Constitutional: well-appearing, in  no acute distress HENT: erythematous patches around nasolabial fold Cardiovascular: regular rate and rhythm, no m/r/g Pulmonary/Chest: normal work of breathing on room air, lungs clear to auscultation bilaterally Abdominal: soft, non-tender, non-distended MSK: No asterixis Psych: intermittently tearful  Assessment & Plan:  Elevated blood pressure reading Blood elevated at 167/85 with repeat at 148/82. She took coreg  prior to coming to appointment. At most recent office visit in April and March, BP was well controlled. Will continue to monitor at follow-up in 1 month. P: Continue BB, interesting that she is on a non selective bb with history of varices. She has follow-up with GI in June  Iron deficiency anemia Taking iron supplement and tolerating well. She does forget doses on occasion. 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 She underwne t emblization of gastric varices with IR. -CBC, iron studies -follow-up with GI 6/25  Type 2 diabetes mellitus (HCC) Lab Results  Component Value Date   HGBA1C 6.8 (A) 11/26/2023  Diabetes remains well controlled. A1c was 11 when diagnosed in 12/24. Last A1c in January 6.9. Rapid decrease in A1c from December to January could be in part due to GI bleed during which her HGB went from 11 to 5. She is frustrated with having to inject insulin , her son helps with this. She didn't bring glucometer in today but notes that AM fasting sugars are <120. She checks blood sugar 3x daily.  P: Decrease tresiba  from 33 to 28 units Start farxiga  5 mg, increase to 10 mg at follow-up in 4 weeks Will plan to continue titrating down insulin .  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 She has been on a DOAC since then. P: Follow-up with IR 4/22.  Seborrheic dermatitis She has  been using hydrocortisone  cream and ketoconazole  cream without relief in rash on face. She is using the steroid cream multiple times daily. No signs of skin atrophy on exam P: Referral to dermatology placed. I think she could benefit from tacrolimus cream.    Patient discussed with Dr. Arnette Bias Dent Plantz, D.O. [facility]  PGY-3 Pager: [phone]  Phone: [phone] Date 11/27/2023  Time 9:57 AM

## 2023-11-27 ENCOUNTER — Ambulatory Visit
Admission: RE | Admit: 2023-11-27 | Discharge: 2023-11-27 | Disposition: A | Discharge status: Home / Self Care | Source: Ambulatory Visit | Attending: Radiology | Admitting: Radiology

## 2023-11-27 DIAGNOSIS — I864 Gastric varices: Secondary | ICD-10-CM

## 2023-11-27 DIAGNOSIS — I10 Essential (primary) hypertension: Secondary | ICD-10-CM | POA: Insufficient documentation

## 2023-11-27 DIAGNOSIS — I8290 Acute embolism and thrombosis of unspecified vein: Secondary | ICD-10-CM | POA: Insufficient documentation

## 2023-11-27 DIAGNOSIS — R030 Elevated blood-pressure reading, without diagnosis of hypertension: Secondary | ICD-10-CM | POA: Insufficient documentation

## 2023-11-27 HISTORY — PX: IR RADIOLOGIST EVAL & MGMT: IMG5224

## 2023-11-27 LAB — CBC
Hematocrit: 48.3 % — ABNORMAL HIGH (ref 34.0–46.6)
Hemoglobin: 16 g/dL — ABNORMAL HIGH (ref 11.1–15.9)
MCH: 26.1 pg — ABNORMAL LOW (ref 26.6–33.0)
MCHC: 33.1 g/dL (ref 31.5–35.7)
MCV: 79 fL (ref 79–97)
Platelets: 103 10*3/uL — ABNORMAL LOW (ref 150–450)
RBC: 6.12 x10E6/uL — ABNORMAL HIGH (ref 3.77–5.28)
RDW: 20 % — ABNORMAL HIGH (ref 11.7–15.4)
WBC: 6.8 10*3/uL (ref 3.4–10.8)

## 2023-11-27 LAB — IRON,TIBC AND FERRITIN PANEL
Ferritin: 73 ng/mL (ref 15–150)
Iron Saturation: 22 % (ref 15–55)
Iron: 80 ug/dL (ref 27–139)
Total Iron Binding Capacity: 371 ug/dL (ref 250–450)
UIBC: 291 ug/dL (ref 118–369)

## 2023-11-27 NOTE — Progress Notes (Signed)
 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 female with a history of NASH cirrhosis who presented to the ED on 07/30/23 after a syncopal episode.  CT chest abdomen pelvis with contrast showed cirrhosis and splenic and gastric varices.  GI was consulted when patient began to have melena and then hematemesis with drop in hemoglobin from 10.3-5.3.  Endoscopy showed bleeding gastric varices.   I performed a balloon-occluded transvenous obliteration of her gastric varices on 08/05/2023.  She subsequently did well and was ultimately discharged.  We spoke over the My Chart Video conference today for her follow-up.   She is doing extremely well and feels fully recovered.  No hematemesis, melena or bloody stools.  No ascites.  She is controlling her diabetes better.   CT 11/20/23  1. Successful BRTO procedure with complete resolution of intraluminal gastric varices. 2. Small esophageal varices are present. 3. No new or ectopic varices.   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    Past Surgical History:  Procedure Laterality Date   ESOPHAGOGASTRODUODENOSCOPY (EGD) WITH PROPOFOL  N/A 07/31/2023   Procedure: ESOPHAGOGASTRODUODENOSCOPY (EGD) WITH PROPOFOL ;  Surgeon: Elois Hair, MD;  Location: [facility] ENDOSCOPY;  Service: Gastroenterology;  Laterality: N/A;   HEMOSTASIS CONTROL  07/31/2023   Procedure: HEMOSTASIS CONTROL;  Surgeon: Elois Hair, MD;  Location: [facility] ENDOSCOPY;  Service: Gastroenterology;;   IR ANGIOGRAM VISCERAL SELECTIVE  08/07/2023   IR ANGIOGRAM VISCERAL SELECTIVE  08/07/2023   IR EMBO VENOUS NOT HEMORR HEMANG  INC GUIDE  ROADMAPPING  08/05/2023   IR RADIOLOGIST EVAL & MGMT  11/27/2023   IR RENAL SELECTIVE  UNI INC S&I MOD SED  08/07/2023   IR US  GUIDE VASC ACCESS RIGHT  08/05/2023   RADIOLOGY WITH ANESTHESIA N/A 08/05/2023   Procedure: IR WITH ANESTHESIA -TIPS;  Surgeon: Radiologist, Medication, MD;  Location: [facility] OR;  Service: Radiology;  Laterality: N/A;    Allergies: Patient has no known allergies.  Medications: Prior to Admission medications   Medication Sig Start Date End Date Taking? Authorizing Provider  Ferrous Sulfate  (IRON) 325 (65 Fe) MG TABS Take 1 tablet by mouth once daily 11/20/23   Dorthy Gavia, MD  apixaban  (ELIQUIS ) 5 MG TABS tablet Take 1 tablet (5 mg total) by mouth 2 (two) times daily. 09/12/23 11/14/23  Dorthy Gavia, MD  atorvastatin  (LIPITOR) 10 MG tablet Take 1 tablet (10 mg total) by mouth daily. 11/14/23 02/12/24  Euell Herrlich, MD  Blood Glucose Monitoring Suppl DEVI 1 each by Does not apply route 3 (three) times daily. May dispense any manufacturer covered by patient's insurance. 08/12/23   Dorthy Gavia, MD  carvedilol  (COREG ) 3.125 MG tablet TAKE 1 TABLET BY MOUTH TWICE A DAY WITH A MEAL 09/14/23   Dorthy Gavia, MD  clotrimazole  (LOTRIMIN ) 1 % cream Apply to affected area 2 times daily 05/16/23   Geiple, Joshua, PA-C  dapagliflozin  propanediol (FARXIGA ) 5 MG TABS tablet Take 1 tablet (5 mg total) by mouth daily before breakfast. 11/26/23   Masters, Alston Jerry, DO  erythromycin ophthalmic ointment Place 1 Application into the left eye 4 (four) times daily. 06/03/23   [provider]  Glucagon , rDNA, (  GLUCAGON  EMERGENCY) 1 MG KIT Inject 1 mg into the skin as needed for up to 2 doses (Severe low blood sugar). 08/12/23   Dorthy Gavia, MD  hydrocortisone  cream 1 % Apply to affected area 2 times daily for symptomatic relief 09/24/23 09/23/24  Dorthy Gavia, MD  insulin  degludec (TRESIBA ) 100 UNIT/ML FlexTouch Pen Inject 28 Units into the skin daily. 11/26/23   Masters, Katie, DO   Insulin  Pen Needle 32G X 4 MM MISC 1 Needle by Does not apply route daily. 10/23/23   Dorthy Gavia, MD  ketoconazole  (NIZORAL ) 2 % cream Apply 1 Application topically daily. 10/11/23   Dorthy Gavia, MD  Lancet Device MISC 1 each by Does not apply route 3 (three) times daily. May dispense any manufacturer covered by patient's insurance. 08/12/23   Dorthy Gavia, MD  Lancets MISC 1 each by Does not apply route 3 (three) times daily. Use as directed to check blood sugar. May dispense any manufacturer covered by patient's insurance and fits patient's device. Patient not taking: Reported on 11/14/2023 08/12/23   Dorthy Gavia, MD  mupirocin  ointment (BACTROBAN ) 2 % Apply 1 Application topically 2 (two) times daily. 06/26/23   [provider]     Family History  Problem Relation Age of Onset   Hypertension Father     Social History   Socioeconomic History   Marital status: Married    Spouse name: Not on file   Number of children: Not on file   Years of education: Not on file   Highest education level: Not on file  Occupational History   Not on file  Tobacco Use   Smoking status: Every Day   Smokeless tobacco: Never  Substance and Sexual Activity   Alcohol use: Yes   Drug use: Never   Sexual activity: Not Currently  Other Topics Concern   Not on file  Social History Narrative   Not on file   Social Drivers of Health   Financial Resource Strain: Not on file  Food Insecurity: No Food Insecurity (07/30/2023)   Hunger Vital Sign    Worried About Running Out of Food in the Last Year: Never true    Ran Out of Food in the Last Year: Never true  Transportation Needs: No Transportation Needs (07/30/2023)   PRAPARE - Administrator, Civil Service (Medical): No    Lack of Transportation (Non-Medical): No  Physical Activity: Not on file  Stress: Not on file  Social Connections: Unknown (05/14/2023)   Received from [facility]   Social Network    Social Network:  Not on file    Review of Systems: A 12 point ROS discussed and pertinent positives are indicated in the HPI above.  All other systems are negative.  Review of Systems  Vital Signs: There were no vitals taken for this visit.  Advance Care Plan: The advanced care plan/surrogate decision maker was discussed at the time of visit and the patient did not wish to discuss or was not able to name a surrogate decision maker or provide an advance care plan.    Physical Exam Constitutional:      General: She is not in acute distress.    Appearance: Normal appearance.  HENT:     Head: Normocephalic and atraumatic.  Eyes:     General: No scleral icterus. Pulmonary:     Effort: Pulmonary effort is normal.  Neurological:     Mental Status: She is alert and oriented to person, place, and time.  Psychiatric:        Mood and Affect: Mood normal.        Behavior: Behavior normal.       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 Follow-up of gastric varices and assess for new ectopic varices. EXAM: CTA ABDOMEN AND PELVIS WITHOUT AND WITH CONTRAST TECHNIQUE: Multidetector CT imaging of the abdomen and pelvis was performed using the standard protocol during bolus administration of intravenous contrast. Multiplanar reconstructed images and MIPs were obtained and reviewed to evaluate the vascular anatomy. RADIATION DOSE REDUCTION: This exam was performed according to the departmental dose-optimization program which includes automated exposure control, adjustment of the mA and/or kV according to patient size and/or use of iterative reconstruction technique. CONTRAST:  OMNIPAQUE  IOHEXOL  350 MG/ML SOLN COMPARISON:  Most recent prior CT scan of the abdomen and pelvis  08/01/2023 and 08/07/2023 FINDINGS: VASCULAR Aorta: Mildly tortuous. Scattered calcified atherosclerotic plaque. No evidence of aneurysm or dissection. Celiac: Patent without evidence of aneurysm, dissection, vasculitis or significant stenosis. SMA: Patent without evidence of aneurysm, dissection, vasculitis or significant stenosis. Renals: Both renal arteries are patent without evidence of aneurysm, dissection, vasculitis, fibromuscular dysplasia or significant stenosis. IMA: Patent without evidence of aneurysm, dissection, vasculitis or significant stenosis. Inflow: Patent without evidence of aneurysm, dissection, vasculitis or significant stenosis. Proximal Outflow: Bilateral common femoral and visualized portions of the superficial and profunda femoral arteries are patent without evidence of aneurysm, dissection, vasculitis or significant stenosis. Veins: Total interval resolution of intraluminal gastric varices from the gastric cardia and fundus. Residual foam sclerosis and is present within the gastro renal shunt as well as the coil pack at the origin of the gastro renal shunt. Splenic and portal veins remain widely patent. The left renal vein is widely patent. Small esophageal varices are visualized. No new ectopic varices. Review of the MIP images confirms the above findings. NON-VASCULAR Lower chest: Lower lobe bronchial wall thickening. Architectural distortion and subpleural reticulation present in the periphery of the lung bases. Findings are consistent with mild pulmonary fibrosis. No acute abnormality. Hepatobiliary: Mildly nodular hepatic contour consistent with known cirrhosis. No arterially enhancing lesion to suggest hepatocellular carcinoma. Moderate gallbladder distention without wall thickening. No biliary ductal dilatation. Pancreas: Unremarkable. No pancreatic ductal dilatation or surrounding inflammatory changes. Spleen: Mild splenomegaly, unchanged. Adrenals/Urinary Tract: Normal appearance  of the adrenal glands. No hydronephrosis, nephrolithiasis or enhancing renal mass. Ureters and bladder are unremarkable. Stomach/Bowel: No focal bowel wall thickening or evidence of obstruction. Normal appendix. Lymphatic: No suspicious lymphadenopathy. Reproductive: Uterus and bilateral adnexa are unremarkable. Other: No abdominal wall hernia or abnormality. No abdominopelvic ascites. Musculoskeletal: No acute fracture or aggressive appearing lytic or blastic osseous lesion. Multilevel degenerative disc disease and bilateral facet arthropathy. IMPRESSION: VASCULAR 1. Successful BRTO procedure with complete resolution of intraluminal gastric varices. 2. Small esophageal varices are present. 3. No new or ectopic varices. 4.  Aortic Atherosclerosis (ICD10-I70.0). NON-VASCULAR 1. No acute abnormality within the abdomen or pelvis. 2. Pulmonary fibrosis and chronic bronchitic changes. 3. Cirrhosis with mild splenomegaly. 4. No enhancing hepatic lesion to suggest the presence of hepatocellular carcinoma. 5. Additional ancillary findings as above. Electronically Signed   By: Fernando Hoyer M.D.   On: 11/27/2023 09:57   IR Radiologist Eval & Mgmt Result Date: 11/27/2023 EXAM: NEW PATIENT OFFICE VISIT CHIEF COMPLAINT: SEE NOTE IN  EPIC HISTORY OF PRESENT ILLNESS: SEE NOTE IN EPIC REVIEW OF SYSTEMS: SEE NOTE IN EPIC PHYSICAL EXAMINATION: SEE NOTE IN EPIC ASSESSMENT AND PLAN: SEE NOTE IN EPIC Electronically Signed   By: Fernando Hoyer M.D.   On: 11/27/2023 09:12    Labs:  CBC: Recent Labs    08/11/23 0306 08/12/23 0802 08/22/23 1437 11/26/23 1114  WBC 7.3 8.1 7.3 6.8  HGB 9.1* 9.6* 10.4* 16.0*  HCT 28.7* 29.8* 34.2 48.3*  PLT 124* 139* 134* 103*    COAGS: Recent Labs    07/31/23 0222 08/05/23 0420  INR 1.4* 1.0    BMP: Recent Labs    08/04/23 0305 08/05/23 0420 08/06/23 0450 08/09/23 1031 11/20/23 0927  NA 139 136 136 136  --   K 3.4* 3.8 3.7 3.6  --   CL 104 102 102 102  --   CO2  25 25 23 23   --   GLUCOSE 101* 185* 251* 223*  --   BUN 7* 8 11 10   --   CALCIUM  8.2* 8.3* 8.6* 9.1  --   CREATININE 0.52 0.63 0.69 0.61 0.60  GFRNONAA >60 >60 >60 >60  --     LIVER FUNCTION TESTS: Recent Labs    07/30/23 1219 07/31/23 0222 08/05/23 0420 08/06/23 0450  BILITOT 0.7 0.6 0.7 0.8  AST 29 43* 28 30  ALT 24 34 34 33  ALKPHOS 59 35* 50 52  PROT 6.0* 3.9* 5.6* 6.2*  ALBUMIN  3.2* 2.2* 3.0* 3.4*    TUMOR MARKERS: No results for input(s): "AFPTM", "CEA", "CA199", "CHROMGRNA" in the last 8760 hours.  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   CT imaging shows success with total resolution of the GVs.    1.) F/U with her GI for upper endoscopy to assess EVs and band any high-risk EVs.  2.) BRTO protocol CT abd/pelvis W and WO contrast and clinic visit in 6 months.     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

## 2023-11-27 NOTE — Assessment & Plan Note (Signed)
 She has been using hydrocortisone  cream and ketoconazole  cream without relief in rash on face. She is using the steroid cream multiple times daily. No signs of skin atrophy on exam P: Referral to dermatology placed. I think she could benefit from tacrolimus cream.

## 2023-11-27 NOTE — Assessment & Plan Note (Signed)
 Lab Results  Component Value Date   HGBA1C 6.8 (A) 11/26/2023  Diabetes remains well controlled. A1c was 11 when diagnosed in 12/24. Last A1c in January 6.9. Rapid decrease in A1c from December to January could be in part due to GI bleed during which her HGB went from 11 to 5. She is frustrated with having to inject insulin , her son helps with this. She didn't bring glucometer in today but notes that AM fasting sugars are <120. She checks blood sugar 3x daily.  P: Decrease tresiba  from 33 to 28 units Start farxiga  5 mg, increase to 10 mg at follow-up in 4 weeks Will plan to continue titrating down insulin .  Could consider starting GLP-1 at follow-up and with goal to stop insulin 

## 2023-11-27 NOTE — Assessment & Plan Note (Signed)
 Taking iron supplement and tolerating well. She does forget doses on occasion. 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 She underwne t emblization of gastric varices with IR. -CBC, iron studies -follow-up with GI 6/25

## 2023-11-27 NOTE — Assessment & Plan Note (Signed)
 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 She has been on a DOAC since then. P: Follow-up with IR 4/22.

## 2023-11-27 NOTE — Assessment & Plan Note (Signed)
 Blood elevated at 167/85 with repeat at 148/82. She took coreg  prior to coming to appointment. At most recent office visit in April and March, BP was well controlled. Will continue to monitor at follow-up in 1 month. P: Continue BB, interesting that she is on a non selective bb with history of varices. She has follow-up with GI in June

## 2023-12-05 NOTE — Progress Notes (Signed)
 Internal Medicine Clinic Attending  Case discussed with the resident at the time of the visit.  We reviewed the resident's history and exam and pertinent patient test results.  I agree with the assessment, diagnosis, and plan of care documented in the resident's note.

## 2023-12-06 ENCOUNTER — Other Ambulatory Visit: Payer: Self-pay | Admitting: Student

## 2023-12-06 NOTE — Telephone Encounter (Signed)
 Medication sent to pharmacy

## 2023-12-14 ENCOUNTER — Telehealth: Payer: Self-pay | Admitting: *Deleted

## 2023-12-14 ENCOUNTER — Other Ambulatory Visit: Payer: Self-pay | Admitting: Student

## 2023-12-14 DIAGNOSIS — I8290 Acute embolism and thrombosis of unspecified vein: Secondary | ICD-10-CM

## 2023-12-14 MED ORDER — APIXABAN 5 MG PO TABS: 5.0000 mg | ORAL_TABLET | Freq: Two times a day (BID) | ORAL | 0 refills | Status: DC

## 2023-12-14 NOTE — Telephone Encounter (Signed)
 I called pt who stated she had talked to Dr Jannice Mends about maybe switching from Eliquis  to Plavix at OV on 4/12. When she saw Neurology she had mentioned this to the PA; PA stated her PCP need to decide. Pt stated Eliquis  cost $600 and she will be out med on Sunday. So she needs a rx for Eliquis  or Plavix; whatever the doctor decides; she does not want to be out of medication.

## 2023-12-14 NOTE — Telephone Encounter (Signed)
 Call to patient informed that a prescription for Eliquis  will be sent to her Pharmacy.  Dr. Authur Leghorn would like for her to continue the Eliquis  for now.  Patient would like prescription sent to the [facility] on [address].  Dr. Authur Leghorn was informed of.

## 2023-12-14 NOTE — Telephone Encounter (Signed)
 Copied from CRM [id number]. Topic: Clinical - Medication Question >> Dec 14, 2023 10:33 AM Arlie Benedict B wrote: Reason for CRM: Patient is calling to find out if she should remain on apixaban  (ELIQUIS ) 5 MG TABS tablet, due to her neurologist advising her to switch over to the Plavix. She stated she had two days left of the Eliquis  but wasn't sure if she continue and request a refill or switch over for a new prescription. [phone] (M)

## 2023-12-23 ENCOUNTER — Other Ambulatory Visit: Payer: Self-pay | Admitting: Student

## 2023-12-24 ENCOUNTER — Other Ambulatory Visit (HOSPITAL_COMMUNITY)

## 2023-12-24 ENCOUNTER — Encounter: Payer: Self-pay | Admitting: Student

## 2023-12-24 ENCOUNTER — Ambulatory Visit (INDEPENDENT_AMBULATORY_CARE_PROVIDER_SITE_OTHER): Admitting: Student

## 2023-12-24 VITALS — BP 128/67 | HR 68 | Ht 66.5 in | Wt 178.2 lb

## 2023-12-24 DIAGNOSIS — E119 Type 2 diabetes mellitus without complications: Secondary | ICD-10-CM | POA: Diagnosis not present

## 2023-12-24 DIAGNOSIS — K7290 Hepatic failure, unspecified without coma: Secondary | ICD-10-CM

## 2023-12-24 DIAGNOSIS — H6121 Impacted cerumen, right ear: Secondary | ICD-10-CM | POA: Diagnosis not present

## 2023-12-24 DIAGNOSIS — I639 Cerebral infarction, unspecified: Secondary | ICD-10-CM

## 2023-12-24 DIAGNOSIS — H6120 Impacted cerumen, unspecified ear: Secondary | ICD-10-CM | POA: Insufficient documentation

## 2023-12-24 DIAGNOSIS — E1100 Type 2 diabetes mellitus with hyperosmolarity without nonketotic hyperglycemic-hyperosmolar coma (NKHHC): Secondary | ICD-10-CM

## 2023-12-24 DIAGNOSIS — Z794 Long term (current) use of insulin: Secondary | ICD-10-CM

## 2023-12-24 DIAGNOSIS — K7460 Unspecified cirrhosis of liver: Secondary | ICD-10-CM

## 2023-12-24 DIAGNOSIS — Z0000 Encounter for general adult medical examination without abnormal findings: Secondary | ICD-10-CM | POA: Insufficient documentation

## 2023-12-24 DIAGNOSIS — E1169 Type 2 diabetes mellitus with other specified complication: Secondary | ICD-10-CM

## 2023-12-24 DIAGNOSIS — Z1211 Encounter for screening for malignant neoplasm of colon: Secondary | ICD-10-CM | POA: Insufficient documentation

## 2023-12-24 DIAGNOSIS — Z7984 Long term (current) use of oral hypoglycemic drugs: Secondary | ICD-10-CM

## 2023-12-24 MED ORDER — DEBROX 6.5 % OT SOLN: 5.0000 [drp] | Freq: Two times a day (BID) | OTIC | 2 refills | Status: DC

## 2023-12-24 MED ORDER — INSULIN DEGLUDEC 100 UNIT/ML ~~LOC~~ SOPN: 23.0000 [IU] | PEN_INJECTOR | Freq: Every day | SUBCUTANEOUS | 11 refills | Status: DC

## 2023-12-24 MED ORDER — DAPAGLIFLOZIN PROPANEDIOL 10 MG PO TABS: 10.0000 mg | ORAL_TABLET | Freq: Every day | ORAL | 11 refills | Status: AC

## 2023-12-24 MED ORDER — TRULICITY 0.75 MG/0.5ML ~~LOC~~ SOAJ: 0.7500 mg | SUBCUTANEOUS | 2 refills | Status: DC

## 2023-12-24 MED ORDER — INSULIN PEN NEEDLE 32G X 4 MM MISC: 1.0000 | Freq: Every day | 11 refills | Status: DC

## 2023-12-24 NOTE — Assessment & Plan Note (Signed)
 Patient referred for colon cancer screening.

## 2023-12-24 NOTE — Patient Instructions (Addendum)
 Maria Rose you for allowing me to take part in your care today.  Here are your instructions.  1.  I am going to start you on a new medicine called Trulicity.  Please start taking this weekly.  You can choose which day you want to take it of the week, and be consistent with that each week.  2.  Decrease your Tresiba  to 23 units daily.  3.  Increase your Farxiga  to 10 mg daily.  4.  I have referred you for a Cologuard test.  They will mail you with the test.  5.  Please come back in 1 month and we can discuss your diabetes medications  6.  Please monitor your blood pressure at home.  Bring the log at the next visit.  7.  I have reached out to the IR doctor to see if we can stop your Eliquis .  But for the time being please continue your Eliquis .  Thank you, Dr. Lydia Sams  If you have any other questions please contact the internal medicine clinic at [phone] If it is after hours, please call the [place] [facility] at [phone] and then ask the person who picks up for the resident on call.

## 2023-12-24 NOTE — Telephone Encounter (Signed)
 Medication sent to pharmacy

## 2023-12-24 NOTE — Assessment & Plan Note (Signed)
 Patient has a history of a stroke.  Likely embolic in nature.  There is some question about stopping her Eliquis .  Neurology seems to be okay with it, but wanted to see if IR is okay with it.  Upon my review it looks like patient had a shower embolus.  Would prefer patient to remain on Eliquis .  Will reach out to IR however.  Plan: - Will reach out to IR about Eliquis  - Neurology states that she can come off of Eliquis  and start aspirin - Continue Eliquis  for now - Continue Lipitor 10 mg daily

## 2023-12-24 NOTE — Assessment & Plan Note (Signed)
 Patient has a past medical history of decompensated liver cirrhosis likely secondary to NASH.  She has no concerns at this time.  She is currently on carvedilol  for variceal bleeding prophylaxis.  MELD score 8.  No acute concerns at this time.  Plan: - Continue carvedilol  3.125 mg twice daily

## 2023-12-24 NOTE — Progress Notes (Signed)
 CC: Diabetes follow-up  HPI:  [name] is a 68 y.o. female with a past medical history of CVA, hepatic cirrhosis, type 2 diabetes mellitus who presents for follow-up appointment.  Please see assessment plan for full HPI.  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     Current Outpatient Medications:    carbamide peroxide (DEBROX) 6.5 % OTIC solution, Place 5 drops into the left ear 2 (two) times daily., Disp: 15 mL, Rfl: 2   Dulaglutide (TRULICITY) 0.75 MG/0.5ML SOAJ, Inject 0.75 mg into the skin once a week., Disp: 2 mL, Rfl: 2   Ferrous Sulfate  (IRON) 325 (65 Fe) MG TABS, Take 1 tablet by mouth once daily, Disp: 30 tablet, Rfl: 0   apixaban  (ELIQUIS ) 5 MG TABS tablet, Take 1 tablet (5 mg total) by mouth 2 (two) times daily., Disp: 120 tablet, Rfl: 0   atorvastatin  (LIPITOR) 10 MG tablet, Take 1 tablet (10 mg total) by mouth daily., Disp: 90 tablet, Rfl: 3   Blood Glucose Monitoring Suppl DEVI, 1 each by Does not apply route 3 (three) times daily. May dispense any manufacturer covered by patient's insurance., Disp: 1 each, Rfl: 2   carvedilol  (COREG ) 3.125 MG tablet, TAKE 1 TABLET BY MOUTH TWICE A DAY WITH FOOD, Disp: 60 tablet, Rfl: 1   clotrimazole  (LOTRIMIN ) 1 % cream, Apply to affected area 2 times daily, Disp: 60 g, Rfl: 1   dapagliflozin  propanediol (FARXIGA ) 10 MG TABS tablet, Take 1 tablet (10 mg total) by mouth daily before breakfast., Disp: 30 tablet, Rfl: 11   erythromycin ophthalmic ointment, Place 1 Application into the left eye 4 (four) times daily., Disp: , Rfl:    Glucagon , rDNA, (GLUCAGON  EMERGENCY) 1 MG KIT,  Inject 1 mg into the skin as needed for up to 2 doses (Severe low blood sugar)., Disp: 1 kit, Rfl: 1   hydrocortisone  cream 1 %, Apply to affected area 2 times daily for symptomatic relief, Disp: 30 g, Rfl: 1   insulin  degludec (TRESIBA ) 100 UNIT/ML FlexTouch Pen, Inject 23 Units into the skin daily., Disp: 3 mL, Rfl: 11   Insulin  Pen Needle 32G X 4 MM MISC, 1 Needle by Does not apply route daily., Disp: 100 each, Rfl: 11   ketoconazole  (NIZORAL ) 2 % cream, Apply 1 Application topically daily., Disp: 15 g, Rfl: 0   Lancet Device MISC, 1 each by Does not apply route 3 (three) times daily. May dispense any manufacturer covered by patient's insurance., Disp: 1 each, Rfl: 3   Lancets MISC, 1 each by Does not apply route 3 (three) times daily. Use as directed to check blood sugar. May dispense any manufacturer covered by patient's insurance and fits patient's device. (Patient not taking: Reported on 11/14/2023), Disp: 1 each, Rfl: 2   mupirocin  ointment (BACTROBAN ) 2 %, Apply 1 Application topically 2 (two) times daily., Disp: , Rfl:   Current Facility-Administered Medications:    diclofenac  Sodium (VOLTAREN ) 1 % topical gel 4 g, 4 g, Topical, QID,   Review of Systems:    Negative except for what is stated in HPI  Physical Exam:  Vitals:  12/24/23 1026 12/24/23 1114  BP: (!) 166/81 128/67  Pulse: 75 68  SpO2: 98%   Weight: 178 lb 3.2 oz (80.8 kg)   Height: 5' 6.5" (1.689 m)    General: Patient is sitting comfortably in the room ENT: Right cerumen impaction appreciated Head: Normocephalic, atraumatic  Cardio: Regular rate and rhythm, no murmurs, rubs or gallops Pulmonary: Clear to ausculation bilaterally with no rales, rhonchi, and crackles    Assessment & Plan:   Decompensated cirrhosis (HCC) Patient has a past medical history of decompensated liver cirrhosis likely secondary to NASH.  She has no concerns at this time.  She is currently on carvedilol  for variceal bleeding prophylaxis.   MELD score 8.  No acute concerns at this time.  Plan: - Continue carvedilol  3.125 mg twice daily  Cerebrovascular accident (CVA) [facility]) Patient has a history of a stroke.  Likely embolic in nature.  There is some question about stopping her Eliquis .  Neurology seems to be okay with it, but wanted to see if IR is okay with it.  Upon my review it looks like patient had a shower embolus.  Would prefer patient to remain on Eliquis .  Will reach out to IR however.  Plan: - Will reach out to IR about Eliquis  - Neurology states that she can come off of Eliquis  and start aspirin - Continue Eliquis  for now - Continue Lipitor 10 mg daily  Type 2 diabetes mellitus (HCC) Well-controlled diabetes at this time.  Current regimen includes Tresiba  28 units daily as well as Farxiga  5 mg daily.  Patient has tolerated Farxiga  well.  She has no concerns with this.  Plan: - Start Trulicity 0.75 mg weekly - Increase Farxiga  to 10 mg daily - Decrease Tresiba  to 23 units daily - Plan is to try to wean patient off insulin   Colon cancer screening Patient referred for colon cancer screening  Cerumen impaction On your exam, patient had cerumen impaction noted to right ear.  During clinic visit did disimpact.  Plan: - Disimpacted - Debrox drops written  Patient discussed with Dr. Sonia Durand, DO PGY-2 Internal Medicine Resident

## 2023-12-24 NOTE — Progress Notes (Signed)
 Internal Medicine Clinic Attending  Case discussed with the resident at the time of the visit.  We reviewed the resident's history and exam and pertinent patient test results.  I agree with the assessment, diagnosis, and plan of care documented in the resident's note.

## 2023-12-24 NOTE — Assessment & Plan Note (Signed)
 On your exam, patient had cerumen impaction noted to right ear.  During clinic visit did disimpact.  Plan: - Disimpacted - Debrox drops written

## 2023-12-24 NOTE — Assessment & Plan Note (Signed)
 Well-controlled diabetes at this time.  Current regimen includes Tresiba  28 units daily as well as Farxiga  5 mg daily.  Patient has tolerated Farxiga  well.  She has no concerns with this.  Plan: - Start Trulicity 0.75 mg weekly - Increase Farxiga  to 10 mg daily - Decrease Tresiba  to 23 units daily - Plan is to try to wean patient off insulin 

## 2023-12-29 ENCOUNTER — Other Ambulatory Visit: Payer: Self-pay | Admitting: Student

## 2024-01-03 ENCOUNTER — Encounter: Payer: Self-pay | Admitting: Student

## 2024-01-03 ENCOUNTER — Telehealth: Payer: Self-pay

## 2024-01-03 NOTE — Telephone Encounter (Signed)
 Prior Authorization for patient (Trulicity 0.75MG /0.5ML auto-injectors) came through on cover my meds was submitted with last office notes and labs awaiting approval or denial.  [id number]

## 2024-01-03 NOTE — Telephone Encounter (Signed)
 Trulicity  is the medication.

## 2024-01-07 NOTE — Telephone Encounter (Signed)
 Per patients insurance this medication is covered under your pharmacy benefit without prior approval.

## 2024-01-08 ENCOUNTER — Encounter: Payer: Self-pay | Admitting: Gastroenterology

## 2024-01-08 ENCOUNTER — Other Ambulatory Visit (INDEPENDENT_AMBULATORY_CARE_PROVIDER_SITE_OTHER)

## 2024-01-08 ENCOUNTER — Telehealth: Payer: Self-pay | Admitting: *Deleted

## 2024-01-08 ENCOUNTER — Ambulatory Visit (INDEPENDENT_AMBULATORY_CARE_PROVIDER_SITE_OTHER): Admitting: Gastroenterology

## 2024-01-08 VITALS — BP 130/80 | HR 79 | Ht 66.5 in | Wt 177.0 lb

## 2024-01-08 DIAGNOSIS — K7469 Other cirrhosis of liver: Secondary | ICD-10-CM | POA: Diagnosis not present

## 2024-01-08 DIAGNOSIS — Z8673 Personal history of transient ischemic attack (TIA), and cerebral infarction without residual deficits: Secondary | ICD-10-CM | POA: Diagnosis not present

## 2024-01-08 DIAGNOSIS — I864 Gastric varices: Secondary | ICD-10-CM | POA: Diagnosis not present

## 2024-01-08 DIAGNOSIS — K7581 Nonalcoholic steatohepatitis (NASH): Secondary | ICD-10-CM | POA: Diagnosis not present

## 2024-01-08 DIAGNOSIS — K7460 Unspecified cirrhosis of liver: Secondary | ICD-10-CM

## 2024-01-08 LAB — COMPREHENSIVE METABOLIC PANEL WITH GFR
ALT: 28 U/L (ref 0–35)
AST: 26 U/L (ref 0–37)
Albumin: 4.8 g/dL (ref 3.5–5.2)
Alkaline Phosphatase: 53 U/L (ref 39–117)
BUN: 16 mg/dL (ref 6–23)
CO2: 29 meq/L (ref 19–32)
Calcium: 9.6 mg/dL (ref 8.4–10.5)
Chloride: 100 meq/L (ref 96–112)
Creatinine, Ser: 0.71 mg/dL (ref 0.40–1.20)
GFR: 87.06 mL/min (ref >60.00)
Glucose, Bld: 112 mg/dL — ABNORMAL HIGH (ref 70–99)
Potassium: 3.6 meq/L (ref 3.5–5.1)
Sodium: 138 meq/L (ref 135–145)
Total Bilirubin: 0.9 mg/dL (ref 0.2–1.2)
Total Protein: 7.8 g/dL (ref 6.0–8.3)

## 2024-01-08 LAB — PROTIME-INR
INR: 1.4 ratio — ABNORMAL HIGH (ref 0.8–1.0)
Prothrombin Time: 14.6 s — ABNORMAL HIGH (ref 9.6–13.1)

## 2024-01-08 NOTE — Progress Notes (Unsigned)
 Discussed the use of AI scribe software for clinical note transcription with the patient, who gave verbal consent to proceed.  HPI : Maria Rose is a 68 y.o. 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  Please see my note from January discussing her hospitalization in December 2024. At her last visit, an EGD to reassess varices was deferred due to her recent stroke.  Since her last visit, she underwent a repeat CT angio by IR which demonstrated complete resolution of intraluminal gastric varices and no new varices.  Small esophageal varices were still present.  She was also cleared to transition from Eliquis  to aspirin by IR and neurology.  She is pending a loop recorder study by cardiology to further evaluate for atrial fibrillation as a cause of her stroke.  She has not experienced any symptoms suggestive of recurrent GI bleeding.  Her diabetes was previously poorly controlled with an A1c of 11.9, which has now improved to around 6. She is currently on Trulicity, Farxiga , and insulin , with ongoing efforts to reduce her insulin  use.  She monitors her blood pressure daily and reports feeling well overall.  She denies any lingering symptoms from her stroke.  She was experiencing vision changes and balance issues, but a new glasses prescription and physical therapy have improved her balance, though she still experiences dizziness when standing up too quickly.  No current gastrointestinal symptoms such as abdominal pain, nausea, vomiting, diarrhea, constipation, heartburn, or acid reflux. Her bowel movements are regular. No swelling in her abdomen or legs, confusion, shortness of breath, chest fluttering, lightheadedness, or dizziness.  She continues to completely abstain from alcohol.      CT angio November 20, 2023   IMPRESSION: VASCULAR   1. Successful BRTO procedure with  complete resolution of intraluminal gastric varices. 2. Small esophageal varices are present. 3. No new or ectopic varices. 4.  Aortic Atherosclerosis (ICD10-I70.0).   NON-VASCULAR   1. No acute abnormality within the abdomen or pelvis. 2. Pulmonary fibrosis and chronic bronchitic changes. 3. Cirrhosis with mild splenomegaly. 4. No enhancing hepatic lesion to suggest the presence of hepatocellular carcinoma. 5. Additional ancillary findings as above.   EGD Jul 31, 2023 - Grade I esophageal varices.  - Type 2 gastroesophageal varices (GOV2, esophageal varices which extend along the fundus), without bleeding. Hemostatic spray applied.  - Red blood in the entire stomach.  - Blood in the entire examined duodenum.  - No specimens collected.  Dec 2024 AMA, ASMA, ANA, viral serologies negative  CT chest/abdomen/pelvis Jul 30, 2023 IMPRESSION: 1. No CT evidence of acute traumatic injury to the chest, abdomen, or pelvis. 2. Mild pulmonary fibrosis in a pattern with apical to basal gradient featuring irregular peripheral interstitial opacity and septal thickening with minimal traction bronchiectasis and minimal areas of subpleural bronchiolectasis of the lung bases. 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 3. Small irregular nodule of the posterior left apex measuring 0.6 cm. Non-contrast chest CT at 6-12 months is recommended. If the nodule is stable at time of repeat CT, then future CT at 18-24 months (from today's scan) is considered optional for low-risk patients, but is recommended for high-risk patients. This recommendation follows the consensus statement: Guidelines for Management of Incidental Pulmonary Nodules Detected on CT Images: From the Fleischner Society 2017; Radiology 2017; 284:228-243. 4. Cirrhosis. 5. 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 6.  Sigmoid diverticulosis without evidence of acute diverticulitis.   Aortic  Atherosclerosis (ICD10-I70.0) and Emphysema (ICD10-J43.9).  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     Past Surgical History:  Procedure Laterality Date   ESOPHAGOGASTRODUODENOSCOPY (EGD) WITH PROPOFOL  N/A 07/31/2023   Procedure: ESOPHAGOGASTRODUODENOSCOPY (EGD) WITH PROPOFOL ;  Surgeon: Elois Hair, MD;  Location: [facility] ENDOSCOPY;  Service: Gastroenterology;  Laterality: N/A;   HEMOSTASIS CONTROL  07/31/2023   Procedure: HEMOSTASIS CONTROL;  Surgeon: Elois Hair, MD;  Location: [facility] ENDOSCOPY;  Service: Gastroenterology;;   IR ANGIOGRAM VISCERAL SELECTIVE  08/07/2023   IR ANGIOGRAM VISCERAL SELECTIVE  08/07/2023   IR EMBO VENOUS NOT HEMORR HEMANG  INC GUIDE ROADMAPPING  08/05/2023   IR RADIOLOGIST EVAL & MGMT  11/27/2023   IR RENAL SELECTIVE  UNI INC S&I MOD SED  08/07/2023   IR US  GUIDE VASC ACCESS RIGHT  08/05/2023   RADIOLOGY WITH ANESTHESIA N/A 08/05/2023   Procedure: IR WITH ANESTHESIA -TIPS;  Surgeon: Radiologist, Medication, MD;  Location: [facility] OR;  Service: Radiology;  Laterality: N/A;   Family History  Problem Relation Age of Onset   Hypertension Father    Social History   Tobacco Use   Smoking status: Every Day   Smokeless tobacco: Never  Substance Use Topics   Alcohol use: Yes   Drug use: Never   Current Outpatient Medications  Medication Sig Dispense Refill   apixaban  (ELIQUIS ) 5 MG TABS tablet Take 1 tablet (5 mg total) by mouth 2 (two) times daily. 120 tablet 0   atorvastatin  (LIPITOR) 10 MG tablet Take 1 tablet (10 mg total) by mouth daily. 90 tablet 3   Blood Glucose Monitoring Suppl DEVI 1 each by Does not apply route 3 (three) times daily. May dispense any manufacturer covered by patient's insurance. 1 each 2   carbamide peroxide (DEBROX) 6.5 % OTIC solution Place 5 drops into the left ear 2 (two) times daily. 15 mL 2    carvedilol  (COREG ) 3.125 MG tablet TAKE 1 TABLET BY MOUTH TWICE A DAY WITH FOOD 180 tablet 1   clotrimazole  (LOTRIMIN ) 1 % cream Apply to affected area 2 times daily 60 g 1   dapagliflozin  propanediol (FARXIGA ) 10 MG TABS tablet Take 1 tablet (10 mg total) by mouth daily before breakfast. 30 tablet 11   Dulaglutide (TRULICITY) 0.75 MG/0.5ML SOAJ Inject 0.75 mg into the skin once a week. 2 mL 2   erythromycin ophthalmic ointment Place 1 Application into the left eye 4 (four) times daily.     Ferrous Sulfate  (IRON) 325 (65 Fe) MG TABS Take 1 tablet by mouth once daily 30 tablet 0   Glucagon , rDNA, (GLUCAGON  EMERGENCY) 1 MG KIT Inject 1 mg into the skin as needed for up to 2 doses (Severe low blood sugar). 1 kit 1   hydrocortisone  cream 1 % Apply to affected area 2 times daily for symptomatic relief 30 g 1   insulin  degludec (TRESIBA ) 100 UNIT/ML FlexTouch Pen Inject 23 Units into the skin daily. 3 mL 11   Insulin  Pen Needle 32G X 4 MM MISC 1 Needle by Does not apply route daily. 100 each 11   ketoconazole  (NIZORAL ) 2 % cream Apply 1 Application topically daily. 15 g 0   Lancet Device MISC 1 each by Does not apply route 3 (three) times daily. May dispense any manufacturer covered  by patient's insurance. 1 each 3   Lancets MISC 1 each by Does not apply route 3 (three) times daily. Use as directed to check blood sugar. May dispense any manufacturer covered by patient's insurance and fits patient's device. 1 each 2   mupirocin  ointment (BACTROBAN ) 2 % Apply 1 Application topically 2 (two) times daily.     Current Facility-Administered Medications  Medication Dose Route Frequency Provider Last Rate Last Admin   diclofenac  Sodium (VOLTAREN ) 1 % topical gel 4 g  4 g Topical QID        No Known Allergies   Review of Systems: All systems reviewed and negative except where noted in HPI.    No results found.  Physical Exam: BP 130/80   Pulse 79   Ht 5' 6.5" (1.689 m)   Wt 177 lb (80.3 kg)    BMI 28.14 kg/m  Constitutional: Pleasant,well-developed, Caucasian female in no acute distress. HEENT: Normocephalic and atraumatic. Conjunctivae are normal. No scleral icterus. Neck supple.  Cardiovascular: Normal rate, regular rhythm.  Pulmonary/chest: Effort normal and breath sounds normal. No wheezing, rales or rhonchi. Abdominal: Soft, nondistended, nontender. Bowel sounds active throughout. There are no masses palpable. No hepatomegaly. Extremities: no edema Neurological: Alert and oriented to person place and time. Skin: Skin is warm and dry. No rashes noted. Psychiatric: Normal mood and affect. Behavior is normal.  CBC    Component Value Date/Time   WBC 6.8 11/26/2023 1114   WBC 8.1 08/12/2023 0802   RBC 6.12 (H) 11/26/2023 1114   RBC 3.92 08/12/2023 0802   HGB 16.0 (H) 11/26/2023 1114   HCT 48.3 (H) 11/26/2023 1114   PLT 103 (L) 11/26/2023 1114   MCV 79 11/26/2023 1114   MCH 26.1 (L) 11/26/2023 1114   MCH 24.5 (L) 08/12/2023 0802   MCHC 33.1 11/26/2023 1114   MCHC 32.2 08/12/2023 0802   RDW 20.0 (H) 11/26/2023 1114   LYMPHSABS 3.9 07/31/2023 0222   MONOABS 0.9 07/31/2023 0222   EOSABS 0.0 07/31/2023 0222   BASOSABS 0.0 07/31/2023 0222    CMP     Component Value Date/Time   NA 136 08/09/2023 1031   K 3.6 08/09/2023 1031   CL 102 08/09/2023 1031   CO2 23 08/09/2023 1031   GLUCOSE 223 (H) 08/09/2023 1031   BUN 10 08/09/2023 1031   CREATININE 0.60 11/20/2023 0927   CALCIUM  9.1 08/09/2023 1031   PROT 6.2 (L) 08/06/2023 0450   ALBUMIN  3.4 (L) 08/06/2023 0450   AST 30 08/06/2023 0450   ALT 33 08/06/2023 0450   ALKPHOS 52 08/06/2023 0450   BILITOT 0.8 08/06/2023 0450   GFRNONAA >60 08/09/2023 1031       Latest Ref Rng & Units 11/26/2023   11:14 AM 08/22/2023    2:37 PM 08/12/2023    8:02 AM  CBC EXTENDED  WBC 3.4 - 10.8 x10E3/uL 6.8  7.3  8.1   RBC 3.77 - 5.28 x10E6/uL 6.12  4.57  3.92   Hemoglobin 11.1 - 15.9 g/dL 78.2  95.6  9.6   HCT 21.3 - 46.6 % 48.3   34.2  29.8   Platelets 150 - 450 x10E3/uL 103  134  139       ASSESSMENT AND PLAN:  68 year old female with a history of decompensated MASH Cirrhosis (gastric variceal bleeding) s/p BRTO, currently doing well.  No other decompensations.  Gastric varices post-BRTO procedure No further bleeding. April CT showed significant improvement. Upper endoscopy recommended to ensure esophageal varices  not high risk and do not require ligation. - Schedule upper endoscopy to reassess gastric/esophageal varices. - Continue Carvedilol  BID  MASH cirrhosis, low MELD Continue to manage diabetes, avoid alcohol.  Low sodium diet.  Avoid NSAIDs.   Patient had been scheduled for RUQUS for [facility] screening next month, but patient's recent CT in April serves as [facility] screen.  Will reschedule US  for October. Repeat MELD labs. - Reschedule liver ultrasound to October. - Order blood tests to assess INR, liver enzymes, and kidney function.  Stroke with resolved symptoms Symptoms resolved. Transition from Eliquis  to aspirin planned. - Transition from Eliquis  to aspirin, once daily.  Type 2 diabetes mellitus with improved control A1c decreased from 11.9 to 6.0. Current medications include Trulicity and Farxiga . Insurance approval pending for once-weekly injection.  The details, risks (including bleeding, perforation, infection, missed lesions, medication reactions and possible hospitalization or surgery if complications occur), benefits, and alternatives to EGD with possible biopsy and possible dilation were discussed with the patient and she consents to proceed.          Dorthy Gavia, MD

## 2024-01-08 NOTE — Telephone Encounter (Signed)
 The medication is covered without a prior approval. I will fax the letter I received from the patients insurance to the pharmacy.

## 2024-01-08 NOTE — Patient Instructions (Signed)
 Your provider has requested that you go to the basement level for lab work before leaving today. Press "B" on the elevator. The lab is located at the first door on the left as you exit the elevator.  Due to recent changes in healthcare laws, you may see the results of your imaging and laboratory studies on MyChart before your provider has had a chance to review them.  We understand that in some cases there may be results that are confusing or concerning to you. Not all laboratory results come back in the same time frame and the provider may be waiting for multiple results in order to interpret others.  Please give us  48 hours in order for your provider to thoroughly review all the results before contacting the office for clarification of your results.    You have been scheduled for an endoscopy. Please follow written instructions given to you at your visit today.  If you use inhalers (even only as needed), please bring them with you on the day of your procedure.  If you take any of the following medications, they will need to be adjusted prior to your procedure:   DO NOT TAKE 7 DAYS PRIOR TO TEST- Trulicity (dulaglutide) Ozempic, Wegovy (semaglutide) Mounjaro (tirzepatide) Bydureon Bcise (exanatide extended release)  DO NOT TAKE 1 DAY PRIOR TO YOUR TEST Rybelsus (semaglutide) Adlyxin (lixisenatide) Victoza (liraglutide) Byetta (exanatide) ___________________________________________________________________________   You have been scheduled for an abdominal ultrasound at [facility] (1st floor of hospital) on 05/12/24 at 9:00 AM. Please arrive 15 minutes prior to your appointment for registration. Make certain not to have anything to eat or drink after midnight prior to your appointment. Should you need to reschedule your appointment, please contact radiology at [phone]. This test typically takes about 30 minutes to perform.   Thank you for entrusting me with your care and  for choosing [facility],  Dr. Darol Elizabeth   _______________________________________________________  If your blood pressure at your visit was 140/90 or greater, please contact your primary care physician to follow up on this.  _______________________________________________________  If you are age 70 or older, your body mass index should be between 23-30. Your Body mass index is 28.14 kg/m. If this is out of the aforementioned range listed, please consider follow up with your Primary Care Provider.  If you are age 41 or younger, your body mass index should be between 19-25. Your Body mass index is 28.14 kg/m. If this is out of the aformentioned range listed, please consider follow up with your Primary Care Provider.   ________________________________________________________  The  GI providers would like to encourage you to use MYCHART to communicate with providers for non-urgent requests or questions.  Due to long hold times on the telephone, sending your provider a message by [facility] may be a faster and more efficient way to get a response.  Please allow 48 business hours for a response.  Please remember that this is for non-urgent requests.  _______________________________________________________

## 2024-01-08 NOTE — Telephone Encounter (Signed)
 Will forward to [name] CMA Copied from CRM [id number]. Topic: Clinical - Prescription Issue >> Jan 08, 2024 10:12 AM Maria Rose F wrote: Reason for CRM:   What is the name of the person calling?   Maria Rose - Pharmacy Technician  Where are you calling from?   [facility]   Calling to follow up on a PA sent in on the 5/29 electronically; They wanted to ensure the electronic notice was received; Agent did confirm with pharmnacy that they require the prior authorization to fill the medication for the patient.    Medication In question:  Dulaglutide (TRULICITY) 0.75 MG/0.5ML SOAJ  Contact information:  [id number] - [place], [place] - [address] [name] [address] Phone: [phone]  Fax: [phone]

## 2024-01-09 LAB — AFP TUMOR MARKER: AFP-Tumor Marker: 2.2 ng/mL

## 2024-01-11 ENCOUNTER — Encounter: Payer: Self-pay | Admitting: *Deleted

## 2024-01-14 ENCOUNTER — Ambulatory Visit: Payer: Self-pay | Admitting: Gastroenterology

## 2024-01-14 NOTE — Progress Notes (Signed)
 Ms. Maria Rose,  Your labs look good with normal liver enzymes and liver function.  Your INR was slightly elevated which can be related to liver dysfunction, but overall your labs are very reassuring

## 2024-01-20 ENCOUNTER — Other Ambulatory Visit: Payer: Self-pay | Admitting: Student

## 2024-01-21 LAB — COLOGUARD

## 2024-01-21 NOTE — Telephone Encounter (Signed)
 Medication sent to pharmacy

## 2024-01-23 ENCOUNTER — Encounter: Payer: Self-pay | Admitting: Student

## 2024-01-23 ENCOUNTER — Ambulatory Visit: Admitting: Student

## 2024-01-23 ENCOUNTER — Ambulatory Visit: Payer: Self-pay | Admitting: Student

## 2024-01-23 VITALS — BP 162/75 | HR 84 | Temp 98.4°F | Ht 66.5 in | Wt 178.4 lb

## 2024-01-23 DIAGNOSIS — R030 Elevated blood-pressure reading, without diagnosis of hypertension: Secondary | ICD-10-CM

## 2024-01-23 DIAGNOSIS — Z1211 Encounter for screening for malignant neoplasm of colon: Secondary | ICD-10-CM

## 2024-01-23 DIAGNOSIS — E1100 Type 2 diabetes mellitus with hyperosmolarity without nonketotic hyperglycemic-hyperosmolar coma (NKHHC): Secondary | ICD-10-CM

## 2024-01-23 DIAGNOSIS — Z7985 Long-term (current) use of injectable non-insulin antidiabetic drugs: Secondary | ICD-10-CM

## 2024-01-23 DIAGNOSIS — L219 Seborrheic dermatitis, unspecified: Secondary | ICD-10-CM | POA: Diagnosis not present

## 2024-01-23 MED ORDER — CARVEDILOL 3.125 MG PO TABS: 3.1250 mg | ORAL_TABLET | Freq: Two times a day (BID) | ORAL | 1 refills | Status: DC

## 2024-01-23 MED ORDER — KETOCONAZOLE 2 % EX CREA: 1.0000 | TOPICAL_CREAM | Freq: Every day | CUTANEOUS | 0 refills | Status: AC

## 2024-01-23 MED ORDER — AMLODIPINE BESYLATE 5 MG PO TABS: 5.0000 mg | ORAL_TABLET | Freq: Every day | ORAL | 11 refills | Status: DC

## 2024-01-23 MED ORDER — TRULICITY 0.75 MG/0.5ML ~~LOC~~ SOAJ: 0.7500 mg | SUBCUTANEOUS | 2 refills | Status: DC

## 2024-01-23 NOTE — Assessment & Plan Note (Signed)
 Presents with a confluent red flaky rash on the upper lip and nasolabial folds. It is improved from last time I saw her, but apparently resolved in the interim and now is back. It remains non-pruritic. She has been using hydrocortisone  cream daily.  She also has been using Vicks vapor rub daily.  I have prescribed her ketoconazole  cream and recommended she use it twice a day.  I recommended she stop using the hydrocortisone  cream for a few weeks.  I also recommended that she stop using the Vicks vapor rub.  Will follow-up on this at her next visit in 4 weeks and see if her rash has cleared up with this treatment.

## 2024-01-23 NOTE — Patient Instructions (Signed)
 Thank you, [name] for allowing us  to provide your care today. Today we discussed your blood pressure, face rash, and diabetes.  For your face rash, I recommend using Nizoral  cream twice daily. Please stop using hydrocortisone  cream for several weeks. Please also stop using Vick's vapor rub.   For your blood pressure, I will start you on a medication called amlodipine, once daily. We will see you back in 4 weeks to recheck this.   Please start taking Trulicity  once weekly. If you tolerate this well, we can go up on the dose in 4 weeks.  You may continue using all of your other medications as prescribed.   I have ordered the following medication/changed the following medications:   Start the following medications: Meds ordered this encounter  Medications   carvedilol  (COREG ) 3.125 MG tablet    Sig: Take 1 tablet (3.125 mg total) by mouth 2 (two) times daily with a meal.    Dispense:  180 tablet    Refill:  1   amLODipine (NORVASC) 5 MG tablet    Sig: Take 1 tablet (5 mg total) by mouth daily.    Dispense:  30 tablet    Refill:  11   ketoconazole  (NIZORAL ) 2 % cream    Sig: Apply 1 Application topically daily.    Dispense:  15 g    Refill:  0     Follow up: 4 weeks  We look forward to seeing you next time. Please call our clinic at [phone] if you have any questions or concerns. The best time to call is Monday-Friday from 9am-4pm, but there is someone available 24/7. If after hours or the weekend, call the main hospital number and ask for the Internal Medicine Resident On-Call. If you need medication refills, please notify your pharmacy one week in advance and they will send us  a request.   Thank you for trusting me with your care. Wishing you the best!   Dorthy Gavia, MD [facility]

## 2024-01-23 NOTE — Progress Notes (Signed)
 CC: diabetes follow up   HPI:  Maria Rose is a 68 y.o. female living with a history stated below and presents today for the chief complaint stated above. Please see problem based assessment and plan for additional details.  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     atorvastatin  (LIPITOR) 10 MG tablet Take 1 tablet (10 mg total) by mouth daily. 90 tablet 3   Blood Glucose Monitoring Suppl DEVI 1 each by Does not apply route 3 (three) times daily. May dispense any manufacturer covered by patient's insurance. 1 each 2   carbamide peroxide (DEBROX) 6.5 % OTIC solution Place 5 drops into the left ear 2 (two) times daily. 15 mL 2   clotrimazole  (LOTRIMIN ) 1 % cream Apply to affected area 2 times daily 60 g 1   dapagliflozin  propanediol (FARXIGA ) 10 MG TABS tablet Take 1 tablet (10 mg total) by mouth daily before breakfast. 30 tablet 11   erythromycin ophthalmic ointment Place 1 Application into the left eye 4 (four) times daily.     Ferrous Sulfate  (IRON) 325 (65 Fe) MG TABS Take 1 tablet by mouth once daily 30 tablet 0   Glucagon , rDNA, (GLUCAGON  EMERGENCY) 1 MG KIT Inject 1 mg into the skin as needed for up to 2 doses (Severe low blood sugar). 1 kit 1   hydrocortisone  cream 1 % Apply to affected area 2 times daily for symptomatic relief 30 g 1   insulin  degludec (TRESIBA ) 100 UNIT/ML FlexTouch Pen Inject 23 Units into the skin daily. 3 mL 11   Insulin  Pen Needle 32G X 4 MM MISC 1 Needle by Does not apply route daily. 100 each 11   Lancet Device MISC 1 each by Does not apply route 3 (three) times daily. May dispense any manufacturer covered by patient's insurance. 1 each 3   Lancets MISC 1 each by Does not  apply route 3 (three) times daily. Use as directed to check blood sugar. May dispense any manufacturer covered by patient's insurance and fits patient's device. 1 each 2   mupirocin  ointment (BACTROBAN ) 2 % Apply 1 Application topically 2 (two) times daily.     Current Facility-Administered Medications on File Prior to Visit  Medication Dose Route Frequency Provider Last Rate Last Admin   diclofenac  Sodium (VOLTAREN ) 1 % topical gel 4 g  4 g Topical QID        Family History  Problem Relation Age of Onset   Hypertension Father    Colon cancer Neg Hx    Stomach cancer Neg Hx    Esophageal cancer Neg Hx    Social History   Socioeconomic History   Marital status: Married    Spouse name: Not on file   Number of children: Not on file   Years of education: Not on file   Highest education level: Not on file  Occupational History   Not on file  Tobacco Use   Smoking status: Every Day   Smokeless tobacco: Never  Substance and Sexual Activity   Alcohol use: Yes   Drug use: Never   Sexual activity: Not Currently  Other Topics Concern   Not on file  Social History Narrative   Not on file   Social Drivers of Health   Financial Resource Strain: Not on file  Food Insecurity: No Food Insecurity (07/30/2023)   Hunger Vital Sign    Worried About Running Out of Food in the Last Year: Never true    Ran Out of Food in the Last Year: Never true  Transportation Needs: No Transportation Needs (07/30/2023)   PRAPARE - Administrator, Civil Service (Medical): No    Lack of Transportation (Non-Medical): No  Physical Activity: Not on file  Stress: Not on file  Social Connections: Unknown (05/14/2023)   [facility] from [facility]   Social Network    Social Network: Not on file  Intimate Partner Violence: Not At Risk (07/30/2023)   Humiliation, Afraid, Rape, and Kick questionnaire    Fear of Current or Ex-Partner: No    Emotionally Abused: No    Physically Abused: No     Sexually Abused: No   Review of Systems: ROS negative except for what is noted on the assessment and plan.  Vitals:   01/23/24 1022 01/23/24 1107  BP: (!) 170/87 (!) 162/75  Pulse: 78 84  Temp: 98.4 F (36.9 C)   TempSrc: Oral   SpO2: 98%   Weight: 178 lb 6.4 oz (80.9 kg)   Height: 5' 6.5 (1.689 m)    Physical Exam: Well-appearing female, sitting in chair no acute distress Heart rate and rhythm regular, euvolemic Lungs clear to auscultate bilaterally Confluent, peeling erythematous rash in the nasolabial folds and upper lip region, nonpruritic No new focal neurological deficits  Assessment & Plan:   Patient discussed with Dr. Ancil Balzarine  Type 2 diabetes mellitus (HCC) She is here for diabetes follow-up appointment today.  Diabetes has been well-controlled recently.  Her most recent hemoglobin A1c was 6.8% in April.  She is currently taking Farxiga  10 mg daily, Tresiba  23 units daily, and a prior authorization for Trulicity  was just approved but she has not taken it yet.  She has been checking her sugars.  Her fasting sugars have been running in the 130s to 140s.  She has not had any lows.  We can have her start her Trulicity  now that it has been approved at 0.75 mg weekly and follow-up in 4 weeks. We will continue Farxiga  10 mg and Tresiba  23 units daily. If she tolerates her Trulicity , we can work on tritrating this up and decreasing her insulin .   Seborrheic dermatitis Presents with a confluent red flaky rash on the upper lip and nasolabial folds. It is improved from last time I saw her, but apparently resolved in the interim and now is back. It remains non-pruritic. She has been using hydrocortisone  cream daily.  She also has been using Vicks vapor rub daily.  I have prescribed her ketoconazole  cream and recommended she use it twice a day.  I recommended she stop using the hydrocortisone  cream for a few weeks.  I also recommended that she stop using the Vicks vapor rub.  Will  follow-up on this at her next visit in 4 weeks and see if her rash has cleared up with this treatment.  Elevated blood pressure reading Blood pressure still elevated at this visit 162/75.  She is taking Coreg  3.125 mg twice daily.  She is currently asymptomatic.  Given her history of stroke, it would be ideal to get better blood pressure control, though her spread is wide between her systolic and her diastolic.  We will try  her on a low-dose of amlodipine 5 mg and see how she does.  She has been keeping a blood pressure log, which has been very helpful.  I have requested that she bring this to her next visit as well.  Dorthy Gavia, MD [facility] Internal Medicine, PGY-1 Phone: [phone] Date 01/23/2024 Time 12:12 PM

## 2024-01-23 NOTE — Assessment & Plan Note (Addendum)
 She is here for diabetes follow-up appointment today.  Diabetes has been well-controlled recently.  Her most recent hemoglobin A1c was 6.8% in April.  She is currently taking Farxiga  10 mg daily, Tresiba  23 units daily, and a prior authorization for Trulicity  was just approved but she has not taken it yet.  She has been checking her sugars.  Her fasting sugars have been running in the 130s to 140s.  She has not had any lows.  We can have her start her Trulicity  now that it has been approved at 0.75 mg weekly and follow-up in 4 weeks. We will continue Farxiga  10 mg and Tresiba  23 units daily. If she tolerates her Trulicity , we can work on tritrating this up and decreasing her insulin .

## 2024-01-23 NOTE — Assessment & Plan Note (Signed)
 Blood pressure still elevated at this visit 162/75.  She is taking Coreg  3.125 mg twice daily.  She is currently asymptomatic.  Given her history of stroke, it would be ideal to get better blood pressure control, though her spread is wide between her systolic and her diastolic.  We will try her on a low-dose of amlodipine 5 mg and see how she does.  She has been keeping a blood pressure log, which has been very helpful.  I have requested that she bring this to her next visit as well.

## 2024-01-24 NOTE — Addendum Note (Signed)
 Addended by: Bevelyn Bryant on: 01/24/2024 11:46 AM   Modules accepted: Level of Service

## 2024-01-24 NOTE — Progress Notes (Signed)
 Internal Medicine Clinic Attending  Case discussed with the resident at the time of the visit.  We reviewed the resident's history and exam and pertinent patient test results.  I agree with the assessment, diagnosis, and plan of care documented in the resident's note.

## 2024-01-28 ENCOUNTER — Other Ambulatory Visit: Payer: Self-pay | Admitting: Student

## 2024-01-29 ENCOUNTER — Other Ambulatory Visit: Payer: Self-pay | Admitting: Student

## 2024-01-29 DIAGNOSIS — E119 Type 2 diabetes mellitus without complications: Secondary | ICD-10-CM

## 2024-02-11 ENCOUNTER — Other Ambulatory Visit (HOSPITAL_COMMUNITY): Payer: Commercial Managed Care - PPO

## 2024-02-12 ENCOUNTER — Ambulatory Visit: Admitting: Cardiology

## 2024-02-12 ENCOUNTER — Encounter: Payer: Self-pay | Admitting: Gastroenterology

## 2024-02-12 ENCOUNTER — Ambulatory Visit: Admitting: Gastroenterology

## 2024-02-12 VITALS — BP 146/75 | HR 74 | Temp 97.9°F | Resp 20 | Ht 66.5 in | Wt 177.0 lb

## 2024-02-12 DIAGNOSIS — I8500 Esophageal varices without bleeding: Secondary | ICD-10-CM

## 2024-02-12 DIAGNOSIS — K2100 Gastro-esophageal reflux disease with esophagitis, without bleeding: Secondary | ICD-10-CM

## 2024-02-12 DIAGNOSIS — K7469 Other cirrhosis of liver: Secondary | ICD-10-CM

## 2024-02-12 DIAGNOSIS — K31A19 Gastric intestinal metaplasia without dysplasia, unspecified site: Secondary | ICD-10-CM

## 2024-02-12 DIAGNOSIS — K2950 Unspecified chronic gastritis without bleeding: Secondary | ICD-10-CM | POA: Diagnosis present

## 2024-02-12 DIAGNOSIS — I8510 Secondary esophageal varices without bleeding: Secondary | ICD-10-CM

## 2024-02-12 MED ORDER — PANTOPRAZOLE SODIUM 40 MG PO TBEC: 40.0000 mg | DELAYED_RELEASE_TABLET | Freq: Every day | ORAL | 3 refills | Status: AC

## 2024-02-12 MED ORDER — PANTOPRAZOLE SODIUM 40 MG PO TBEC: DELAYED_RELEASE_TABLET | ORAL | 0 refills | Status: DC

## 2024-02-12 MED ORDER — SODIUM CHLORIDE 0.9 % IV SOLN: 500.0000 mL | Freq: Once | INTRAVENOUS | Status: DC

## 2024-02-12 NOTE — Op Note (Signed)
 [facility] Patient Name: Heavenly Christine Procedure Date: 02/12/2024 9:41 AM MRN: [id number] Endoscopist: Glendia E. Stacia , MD, [id number] Age: 68 Referring MD:  Date of Birth: May 01, 1956 Gender: Female Account #: [id number] Procedure:                Upper GI endoscopy Indications:              Follow-up of gastric varices; patient experienced                            UGIB from gastric varices in Dec 2024, treated with                            BRTO Medicines:                Monitored Anesthesia Care Procedure:                Pre-Anesthesia Assessment:                           - Prior to the procedure, a History and Physical                            was performed, and patient medications and                            allergies were reviewed. The patient's tolerance of                            previous anesthesia was also reviewed. The risks                            and benefits of the procedure and the sedation                            options and risks were discussed with the patient.                            All questions were answered, and informed consent                            was obtained. Prior Anticoagulants: The patient has                            taken no anticoagulant or antiplatelet agents. ASA                            Grade Assessment: III - A patient with severe                            systemic disease. After reviewing the risks and                            benefits, the patient was deemed in satisfactory  condition to undergo the procedure.                           After obtaining informed consent, the endoscope was                            passed under direct vision. Throughout the                            procedure, the patient's blood pressure, pulse, and                            oxygen saturations were monitored continuously. The                            Olympus Scope SN [id number] was introduced  through the                            mouth, and advanced to the second part of duodenum.                            The upper GI endoscopy was accomplished without                            difficulty. The patient tolerated the procedure                            well. Scope In: 10:51:43 AM Scope Out: 10:58:35 AM Total Procedure Duration: 0 hours 6 minutes 52 seconds  Findings:                 The examined portions of the nasopharynx,                            oropharynx and larynx were normal.                           Grade III varices were found in the lower third of                            the esophagus. They were medium in size.                           LA Grade A (one or more mucosal breaks less than 5                            mm, not extending between tops of 2 mucosal folds)                            esophagitis with no bleeding was found.                           The exam of the esophagus was otherwise normal.  Diffuse granular mucosa was found in the gastric                            body. Biopsies were taken with a cold forceps for                            Helicobacter pylori testing. Estimated blood loss                            was minimal.                           The exam of the stomach was otherwise normal.                           The examined duodenum was normal. Complications:            No immediate complications. Estimated Blood Loss:     Estimated blood loss was minimal. Impression:               - The examined portions of the nasopharynx,                            oropharynx and larynx were normal.                           - Grade III esophageal varices. These did not                            appear very amenable to band ligation. No stigmata                            present                           - LA Grade A reflux esophagitis with no bleeding.                           - Granular gastric mucosa. Biopsied.                            - Normal examined duodenum. Recommendation:           - Patient has a contact number available for                            emergencies. The signs and symptoms of potential                            delayed complications were discussed with the                            patient. Return to normal activities tomorrow.                            Written discharge instructions were provided to the  patient.                           - Resume previous diet.                           - Continue present medications.                           - Await pathology results.                           - Continue carvedilol  for secondary bleeding                            prophylaxis                           - Repeat EGD in 2 years for variceal surveillance                           - Use Protonix  (pantoprazole ) 40 mg PO daily for 4                            weeks to heal esophagitis. Tarryn Bogdan E. Stacia, MD 02/12/2024 11:09:51 AM This report has been signed electronically.

## 2024-02-12 NOTE — Progress Notes (Signed)
 Called to room to assist during endoscopic procedure.  Patient ID and intended procedure confirmed with present staff. Received instructions for my participation in the procedure from the performing physician.

## 2024-02-12 NOTE — Patient Instructions (Signed)
 YOU HAD AN ENDOSCOPIC PROCEDURE TODAY AT THE [facility]:   Refer to the procedure report that was given to you for any specific questions about what was found during the examination.  If the procedure report does not answer your questions, please call your gastroenterologist to clarify.  If you requested that your care partner not be given the details of your procedure findings, then the procedure report has been included in a sealed envelope for you to review at your convenience later.  YOU SHOULD EXPECT: Some feelings of bloating in the abdomen. Passage of more gas than usual.  Walking can help get rid of the air that was put into your GI tract during the procedure and reduce the bloating. If you had a lower endoscopy (such as a colonoscopy or flexible sigmoidoscopy) you may notice spotting of blood in your stool or on the toilet paper. If you underwent a bowel prep for your procedure, you may not have a normal bowel movement for a few days.  Please Note:  You might notice some irritation and congestion in your nose or some drainage.  This is from the oxygen used during your procedure.  There is no need for concern and it should clear up in a day or so.  SYMPTOMS TO REPORT IMMEDIATELY:   Following upper endoscopy (EGD)  Vomiting of blood or coffee ground material  New chest pain or pain under the shoulder blades  Painful or persistently difficult swallowing  New shortness of breath  Fever of 100F or higher  Black, tarry-looking stools  Resume previous diet Continue present medications Await pathology results Continue carvedilol  for secondary bleeding Repeat EGD in 2 years Use Protonix  (pantoprazole ) - 40 mg daily for 4 weeks to heal esophagitis   For urgent or emergent issues, a gastroenterologist can be reached at any hour by calling [phone]. Do not use MyChart messaging for urgent concerns.    DIET:  We do recommend a small meal at first, but then you may  proceed to your regular diet.  Drink plenty of fluids but you should avoid alcoholic beverages for 24 hours.  ACTIVITY:  You should plan to take it easy for the rest of today and you should NOT DRIVE or use heavy machinery until tomorrow (because of the sedation medicines used during the test).    FOLLOW UP: Our staff will call the number listed on your records the next business day following your procedure.  We will call around 7:15- 8:00 am to check on you and address any questions or concerns that you may have regarding the information given to you following your procedure. If we do not reach you, we will leave a message.     If any biopsies were taken you will be contacted by phone or by letter within the next 1-3 weeks.  Please call us  at [phone] if you have not heard about the biopsies in 3 weeks.    SIGNATURES/CONFIDENTIALITY: You and/or your care partner have signed paperwork which will be entered into your electronic medical record.  These signatures attest to the fact that that the information above on your After Visit Summary has been reviewed and is understood.  Full responsibility of the confidentiality of this discharge information lies with you and/or your care-partner.

## 2024-02-12 NOTE — Progress Notes (Signed)
 Vss nad trans to pacu

## 2024-02-12 NOTE — Progress Notes (Signed)
 [facility] History and Physical   Primary Care Physician:  Amilibia, Jaden, DO   Reason for Procedure:   Follow up gastric varices s/p BRTO  Plan:    EGD     HPI: Maria Rose is a 68 y.o. 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  Repeat EGD to assess resolution of gastroesophageal varices.    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   Stroke (HCC) 07/31/2023    Past Surgical History:  Procedure Laterality Date   ESOPHAGOGASTRODUODENOSCOPY (EGD) WITH PROPOFOL  N/A 07/31/2023   Procedure: ESOPHAGOGASTRODUODENOSCOPY (EGD) WITH PROPOFOL ;  Surgeon: Stacia Glendia BRAVO, MD;  Location: [facility] ENDOSCOPY;  Service: Gastroenterology;  Laterality: N/A;   HEMOSTASIS CONTROL  07/31/2023   Procedure: HEMOSTASIS CONTROL;  Surgeon: Stacia Glendia BRAVO, MD;  Location: [facility] ENDOSCOPY;  Service: Gastroenterology;;   IR ANGIOGRAM VISCERAL SELECTIVE  08/07/2023   IR ANGIOGRAM VISCERAL SELECTIVE  08/07/2023   IR EMBO VENOUS NOT HEMORR HEMANG  INC GUIDE ROADMAPPING  08/05/2023   IR RADIOLOGIST EVAL & MGMT  11/27/2023   IR RENAL SELECTIVE  UNI INC S&I MOD SED  08/07/2023   IR US  GUIDE VASC ACCESS RIGHT  08/05/2023   RADIOLOGY WITH ANESTHESIA N/A 08/05/2023   Procedure: IR WITH ANESTHESIA -TIPS;  Surgeon: Radiologist, Medication, MD;  Location: [facility] OR;  Service: Radiology;  Laterality: N/A;    Prior to Admission medications   Medication Sig Start Date End Date Taking? Authorizing Provider  amLODipine  (NORVASC ) 5 MG tablet Take 1 tablet (5 mg total) by mouth daily. 01/23/24 01/22/25 Yes Gregary Sharper, MD  aspirin 81 MG chewable tablet Chew 81 mg by mouth daily.   Yes [provider]  atorvastatin  (LIPITOR) 10 MG tablet Take 1 tablet (10 mg total) by mouth daily. 11/14/23  02/12/24 Yes Loni Soyla DELENA, MD  Blood Glucose Monitoring Suppl DEVI 1 each by Does not apply route 3 (three) times daily. May dispense any manufacturer covered by patient's insurance. 08/12/23  Yes Gregary Sharper, MD  carbamide peroxide (DEBROX) 6.5 % OTIC solution Place 5 drops into the left ear 2 (two) times daily. 12/24/23 12/23/24 Yes Tobie Gaines, DO  carvedilol  (COREG ) 3.125 MG tablet Take 1 tablet (3.125 mg total) by mouth 2 (two) times daily with a meal. 01/23/24  Yes Gregary Sharper, MD  dapagliflozin  propanediol (FARXIGA ) 10 MG TABS tablet Take 1 tablet (10 mg total) by mouth daily before breakfast. 12/24/23  Yes Tobie Gaines, DO  Ferrous Sulfate  (IRON) 325 (65 Fe) MG TABS Take 1 tablet by mouth once daily 01/21/24  Yes Gregary Sharper, MD  insulin  degludec (TRESIBA ) 100 UNIT/ML FlexTouch Pen Inject 23 Units into the skin daily. 12/24/23  Yes Tobie Gaines, DO  Lancet Device MISC 1 each by Does not apply route 3 (three) times daily. May dispense any manufacturer covered by patient's insurance. 08/12/23  Yes Gregary Sharper, MD  Lancets MISC 1 each by Does not apply route 3 (three) times daily. Use as directed to check blood sugar. May dispense any manufacturer covered by patient's insurance and fits patient's device. 08/12/23  Yes Gregary Sharper, MD  clotrimazole  (LOTRIMIN ) 1 % cream Apply to affected area 2 times daily 05/16/23   Geiple, Joshua, PA-C  erythromycin ophthalmic ointment Place 1 Application into the left eye 4 (four)  times daily. 06/03/23   [provider]  Glucagon , rDNA, (GLUCAGON  EMERGENCY) 1 MG KIT Inject 1 mg into the skin as needed for up to 2 doses (Severe low blood sugar). 08/12/23   Gregary Sharper, MD  hydrocortisone  cream 1 % Apply to affected area 2 times daily for symptomatic relief 09/24/23 09/23/24  Gregary Sharper, MD  Insulin  Pen Needle 32G X 4 MM MISC 1 Needle by Does not apply route daily. 12/24/23   Tobie Gaines, DO  ketoconazole  (NIZORAL ) 2 % cream Apply 1  Application topically daily. 01/23/24   Gregary Sharper, MD  mupirocin  ointment (BACTROBAN ) 2 % Apply 1 Application topically 2 (two) times daily. 06/26/23   [provider]  TRULICITY  0.75 MG/0.5ML SOAJ INJECT 3/4 (THREE-FOURTHS) MG SUBCUTANEOUSLY  ONCE A WEEK 01/30/24   Jolaine Pac, DO    Current Outpatient Medications  Medication Sig Dispense Refill   amLODipine  (NORVASC ) 5 MG tablet Take 1 tablet (5 mg total) by mouth daily. 30 tablet 11   aspirin 81 MG chewable tablet Chew 81 mg by mouth daily.     atorvastatin  (LIPITOR) 10 MG tablet Take 1 tablet (10 mg total) by mouth daily. 90 tablet 3   Blood Glucose Monitoring Suppl DEVI 1 each by Does not apply route 3 (three) times daily. May dispense any manufacturer covered by patient's insurance. 1 each 2   carbamide peroxide (DEBROX) 6.5 % OTIC solution Place 5 drops into the left ear 2 (two) times daily. 15 mL 2   carvedilol  (COREG ) 3.125 MG tablet Take 1 tablet (3.125 mg total) by mouth 2 (two) times daily with a meal. 180 tablet 1   dapagliflozin  propanediol (FARXIGA ) 10 MG TABS tablet Take 1 tablet (10 mg total) by mouth daily before breakfast. 30 tablet 11   Ferrous Sulfate  (IRON) 325 (65 Fe) MG TABS Take 1 tablet by mouth once daily 30 tablet 0   insulin  degludec (TRESIBA ) 100 UNIT/ML FlexTouch Pen Inject 23 Units into the skin daily. 3 mL 11   Lancet Device MISC 1 each by Does not apply route 3 (three) times daily. May dispense any manufacturer covered by patient's insurance. 1 each 3   Lancets MISC 1 each by Does not apply route 3 (three) times daily. Use as directed to check blood sugar. May dispense any manufacturer covered by patient's insurance and fits patient's device. 1 each 2   clotrimazole  (LOTRIMIN ) 1 % cream Apply to affected area 2 times daily 60 g 1   erythromycin ophthalmic ointment Place 1 Application into the left eye 4 (four) times daily.     Glucagon , rDNA, (GLUCAGON  EMERGENCY) 1 MG KIT Inject 1 mg into the  skin as needed for up to 2 doses (Severe low blood sugar). 1 kit 1   hydrocortisone  cream 1 % Apply to affected area 2 times daily for symptomatic relief 30 g 1   Insulin  Pen Needle 32G X 4 MM MISC 1 Needle by Does not apply route daily. 100 each 11   ketoconazole  (NIZORAL ) 2 % cream Apply 1 Application topically daily. 15 g 0   mupirocin  ointment (BACTROBAN ) 2 % Apply 1 Application topically 2 (two) times daily.     TRULICITY  0.75 MG/0.5ML SOAJ INJECT 3/4 (THREE-FOURTHS) MG SUBCUTANEOUSLY  ONCE A WEEK 8 mL 2   Current Facility-Administered Medications  Medication Dose Route Frequency Provider Last Rate Last Admin   0.9 %  sodium chloride  infusion  500 mL Intravenous Once Jamisyn Langer E, MD  diclofenac  Sodium (VOLTAREN ) 1 % topical gel 4 g  4 g Topical QID         Allergies as of 02/12/2024   (No Known Allergies)    Family History  Problem Relation Age of Onset   Hypertension Father    Colon cancer Neg Hx    Stomach cancer Neg Hx    Esophageal cancer Neg Hx     Social History   Socioeconomic History   Marital status: Married    Spouse name: Not on file   Number of children: Not on file   Years of education: Not on file   Highest education level: Not on file  Occupational History   Not on file  Tobacco Use   Smoking status: Former    Types: Cigarettes   Smokeless tobacco: Never  Vaping Use   Vaping status: Never Used  Substance and Sexual Activity   Alcohol use: Not Currently   Drug use: Never   Sexual activity: Not Currently  Other Topics Concern   Not on file  Social History Narrative   Not on file   Social Drivers of Health   Financial Resource Strain: Not on file  Food Insecurity: No Food Insecurity (07/30/2023)   Hunger Vital Sign    Worried About Running Out of Food in the Last Year: Never true    Ran Out of Food in the Last Year: Never true  Transportation Needs: No Transportation Needs (07/30/2023)   PRAPARE - Scientist, research (physical sciences) (Medical): No    Lack of Transportation (Non-Medical): No  Physical Activity: Not on file  Stress: Not on file  Social Connections: Unknown (05/14/2023)   Received from [facility]   Social Network    Social Network: Not on file  Intimate Partner Violence: Not At Risk (07/30/2023)   Humiliation, Afraid, Rape, and Kick questionnaire    Fear of Current or Ex-Partner: No    Emotionally Abused: No    Physically Abused: No    Sexually Abused: No    Review of Systems:  All other review of systems negative except as mentioned in the HPI.  Physical Exam: Vital signs BP (!) 145/75   Pulse 72   Temp 97.9 F (36.6 C) (Temporal)   Ht 5' 6.5 (1.689 m)   Wt 177 lb (80.3 kg)   SpO2 94%   BMI 28.14 kg/m   General:   Alert,  Well-developed, well-nourished, pleasant and cooperative in NAD Airway:  Mallampati 2 Lungs:  Clear throughout to auscultation.   Heart:  Regular rate and rhythm; no murmurs, clicks, rubs,  or gallops. Abdomen:  Soft, nontender and nondistended. Normal bowel sounds.   Neuro/Psych:  Normal mood and affect. A and O x 3   Adair Ducre E. Stacia, MD [facility]

## 2024-02-12 NOTE — Progress Notes (Signed)
 Pt's states no medical or surgical changes since previsit or office visit.

## 2024-02-13 ENCOUNTER — Telehealth: Payer: Self-pay

## 2024-02-13 NOTE — Telephone Encounter (Addendum)
 Cathy from [employer] called regarding a cologuard sample they received from the patient. Per Donny the patient did not write the date or time of collection on the sample which is needed in order to process the sample. I called the patient x2 I was unable to reach her. Patients mailbox is full.  [employer] call back @ [phone].

## 2024-02-13 NOTE — Telephone Encounter (Signed)
  Follow up Call-     02/12/2024    9:54 AM  Call back number  Post procedure Call Back phone  # [phone]  Permission to leave phone message Yes     Patient questions:  Do you have a fever, pain , or abdominal swelling? No. Pain Score  0 *  Have you tolerated food without any problems? Yes.    Have you been able to return to your normal activities? Yes.    Do you have any questions about your discharge instructions: Diet   No. Medications  No. Follow up visit  No.  Do you have questions or concerns about your Care? No.  Actions: * If pain score is 4 or above: No action needed, pain <4.

## 2024-02-14 LAB — COLOGUARD

## 2024-02-18 ENCOUNTER — Ambulatory Visit: Payer: Self-pay | Admitting: Internal Medicine

## 2024-02-18 ENCOUNTER — Ambulatory Visit (HOSPITAL_COMMUNITY)
Admission: RE | Admit: 2024-02-18 | Discharge: 2024-02-18 | Disposition: A | Discharge status: Home / Self Care | Source: Ambulatory Visit | Attending: Internal Medicine | Admitting: Internal Medicine

## 2024-02-18 DIAGNOSIS — I639 Cerebral infarction, unspecified: Secondary | ICD-10-CM | POA: Diagnosis not present

## 2024-02-18 LAB — ECHOCARDIOGRAM COMPLETE BUBBLE STUDY
Area-P 1/2: 3.42 cm2
S' Lateral: 2.92 cm

## 2024-02-18 LAB — SURGICAL PATHOLOGY

## 2024-02-19 ENCOUNTER — Other Ambulatory Visit: Payer: Self-pay

## 2024-02-19 MED ORDER — IRON 325 (65 FE) MG PO TABS: 1.0000 | ORAL_TABLET | Freq: Every day | ORAL | 0 refills | Status: DC

## 2024-02-19 NOTE — Telephone Encounter (Signed)
 Medication sent to pharmacy

## 2024-02-20 ENCOUNTER — Ambulatory Visit: Payer: Self-pay | Admitting: Gastroenterology

## 2024-02-20 NOTE — Progress Notes (Signed)
 Maria Rose,  The biopsies taken from your stomach were notable for mild chronic gastritis (inflammation) which is a common finding, but there was no evidence of Helicobacter pylori infection.   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  This finding is thought to represent an increased risk of stomach cancer in some patients.  Patients at highest risk are those patients with a family history of stomach cancer and those with a smoking history, as well as patients who are from areas with high rates of stomach cancer. Periodic surveillance (repeat endoscopy with biopsies) is often performed to monitor for development of cancer or further precancerous changes.  As we will be repeating an EGD in 2 years for varices surveillance, we will also plan to repeat biopsies of your stomach at that time as well.

## 2024-02-21 ENCOUNTER — Ambulatory Visit: Admitting: Student

## 2024-02-21 VITALS — BP 134/64 | HR 70 | Ht 66.5 in | Wt 181.2 lb

## 2024-02-21 DIAGNOSIS — I10 Essential (primary) hypertension: Secondary | ICD-10-CM

## 2024-02-21 DIAGNOSIS — E119 Type 2 diabetes mellitus without complications: Secondary | ICD-10-CM | POA: Diagnosis not present

## 2024-02-21 DIAGNOSIS — Z794 Long term (current) use of insulin: Secondary | ICD-10-CM

## 2024-02-21 DIAGNOSIS — L219 Seborrheic dermatitis, unspecified: Secondary | ICD-10-CM | POA: Diagnosis not present

## 2024-02-21 DIAGNOSIS — Z7985 Long-term (current) use of injectable non-insulin antidiabetic drugs: Secondary | ICD-10-CM

## 2024-02-21 DIAGNOSIS — E1100 Type 2 diabetes mellitus with hyperosmolarity without nonketotic hyperglycemic-hyperosmolar coma (NKHHC): Secondary | ICD-10-CM

## 2024-02-21 DIAGNOSIS — Z7984 Long term (current) use of oral hypoglycemic drugs: Secondary | ICD-10-CM

## 2024-02-21 LAB — POCT GLYCOSYLATED HEMOGLOBIN (HGB A1C): Hemoglobin A1C: 5.5 % (ref 4.0–5.6)

## 2024-02-21 LAB — GLUCOSE, CAPILLARY: Glucose-Capillary: 119 mg/dL — ABNORMAL HIGH (ref 70–99)

## 2024-02-21 MED ORDER — LOSARTAN POTASSIUM 25 MG PO TABS: 25.0000 mg | ORAL_TABLET | Freq: Every day | ORAL | 2 refills | Status: DC

## 2024-02-21 MED ORDER — INSULIN PEN NEEDLE 32G X 4 MM MISC
1.0000 | Freq: Every day | 11 refills | Status: AC
Start: 2024-02-21 — End: ?

## 2024-02-21 MED ORDER — TRULICITY 0.75 MG/0.5ML ~~LOC~~ SOAJ: 1.5000 mg | SUBCUTANEOUS | 2 refills | Status: DC

## 2024-02-21 NOTE — Progress Notes (Signed)
 Established Patient Office Visit  Subjective   Patient ID: Maria Rose, female    DOB: 04-24-1956  Age: 68 y.o. MRN: [id number]  Chief Complaint  Patient presents with   Follow-up    4wk follow up htn, DM, and rash     HPI This is a 68 year old female with past medical history of hypertension, seborrheic dermatitis, type 2 diabetes presenting today for 4-week follow-up on above conditions. Last OV 01/23/2024  ROS   As per assessment and plan  Objective:     BP 134/64 (BP Location: Left Arm, Patient Position: Sitting, Cuff Size: Small)   Pulse 70   Ht 5' 6.5 (1.689 m)   Wt 181 lb 3.2 oz (82.2 kg)   SpO2 94%   BMI 28.81 kg/m  BP Readings from Last 3 Encounters:  02/21/24 134/64  02/12/24 (!) 146/75  01/23/24 (!) 162/75   Wt Readings from Last 3 Encounters:  02/21/24 181 lb 3.2 oz (82.2 kg)  02/12/24 177 lb (80.3 kg)  01/23/24 178 lb 6.4 oz (80.9 kg)      Physical Exam  General: Sitting in char Cardiovascular: RR Pulm: Breathing comfortably Abdomen: Soft, NT, ND, BS present MSK: No LE edema bilaterally Skin: pinkish red rash along the nasal folds with minimal flakes.   Results for orders placed or performed in visit on 02/21/24  Glucose, capillary  Result Value Ref Range   Glucose-Capillary 119 (H) 70 - 99 mg/dL  POC Hbg J8R  Result Value Ref Range   Hemoglobin A1C 5.5 4.0 - 5.6 %   HbA1c POC (<> result, manual entry)     HbA1c, POC (prediabetic range)     HbA1c, POC (controlled diabetic range)      Last metabolic panel Lab Results  Component Value Date   GLUCOSE 112 (H) 01/08/2024   NA 138 01/08/2024   K 3.6 01/08/2024   CL 100 01/08/2024   CO2 29 01/08/2024   BUN 16 01/08/2024   CREATININE 0.71 01/08/2024   GFR 87.06 01/08/2024   CALCIUM  9.6 01/08/2024   PHOS 3.5 07/31/2023   PROT 7.8 01/08/2024   ALBUMIN  4.8 01/08/2024   BILITOT 0.9 01/08/2024   ALKPHOS 53 01/08/2024   AST 26 01/08/2024   ALT 28 01/08/2024   ANIONGAP 11 08/09/2023    Last lipids Lab Results  Component Value Date   CHOL 104 07/31/2023   HDL 23 (L) 07/31/2023   LDLCALC 63 07/31/2023   TRIG 89 07/31/2023   CHOLHDL 4.5 07/31/2023   Last hemoglobin A1c Lab Results  Component Value Date   HGBA1C 5.5 02/21/2024      The ASCVD Risk score (Arnett DK, et al., 2019) failed to calculate for the following reasons:   Risk score cannot be calculated because patient has a medical history suggesting prior/existing ASCVD    Assessment & Plan:  As discussed with Dr Jeanelle   Problem List Items Addressed This Visit       Cardiovascular and Mediastinum   Hypertension   BP Readings from Last 3 Encounters:  02/21/24 (!) 159/77  02/12/24 (!) 146/75  01/23/24 (!) 162/75   Patient is currently taking Coreg  3.125 mg twice daily and amlodipine  5 mg daily.  Blood pressures at home SBP 125-130, HR 78-71. No CP or SOB.  - Stop amlodipine  5 mg - Start losartan  25 mg daily with underlying history of DM. - Continue Coreg  3.125 mg twice daily      Relevant Medications   losartan  (COZAAR ) 25  MG tablet     Endocrine   Type 2 diabetes mellitus (HCC) - Primary   Lab Results  Component Value Date   HGBA1C 5.5 02/21/2024   Last A1c 6.8% checked on 11/26/2023; A1c is 5.5 today. Patient is currently taking Tresiba  23 units daily, Farxiga  10 mg daily, was started on Trulicity  0.75 mg weekly. FSBG 120s at home. Denies any GI symptoms or hypoglycemic events.  - Increase Trulicity  1.5 mg weekly  - Decrease Tresiba  to 20 units daily - Continue Farxiga  10 mg daily      Relevant Medications   Dulaglutide  (TRULICITY ) 0.75 MG/0.5ML SOAJ   losartan  (COZAAR ) 25 MG tablet   Other Relevant Orders   POC Hbg A1C (Completed)     Musculoskeletal and Integument   Seborrheic dermatitis   Per the last office visit, patient had a confluent red follicular rash on the upper lip and nasolabial folds.  Patient was prescribed ketoconazole  cream, per evaluation today, minimal red  rash on the nasal fold with mild flakes.  - Continue ketoconazole  cream       Return in about 3 months (around 05/23/2024) for HTN, DM.    Toma Edwards, DO

## 2024-02-21 NOTE — Progress Notes (Signed)
 Internal Medicine Clinic Attending  Case discussed with the resident at the time of the visit.  We reviewed the resident's history and exam and pertinent patient test results.  I agree with the assessment, diagnosis, and plan of care documented in the resident's note.

## 2024-02-21 NOTE — Assessment & Plan Note (Signed)
 Lab Results  Component Value Date   HGBA1C 5.5 02/21/2024   Last A1c 6.8% checked on 11/26/2023; A1c is 5.5 today. Patient is currently taking Tresiba  23 units daily, Farxiga  10 mg daily, was started on Trulicity  0.75 mg weekly. FSBG 120s at home. Denies any GI symptoms or hypoglycemic events.  - Increase Trulicity  1.5 mg weekly  - Decrease Tresiba  to 20 units daily - Continue Farxiga  10 mg daily

## 2024-02-21 NOTE — Patient Instructions (Addendum)
 Thank you, Ms.[name] for allowing us  to provide your care today. Today we discussed:  - STOP TAKING AMLODIPINE   - Start losartan  25 mg, take 1 tablet by mouth 1 time daily - Decrease Tresiba  to 20 units a day - Increase Trulicity  to 1.5 mg weekly  I have ordered the following labs for you:  Lab Orders  No laboratory test(s) ordered today     Tests ordered today:  None  Referrals ordered today:   Referral Orders  No referral(s) requested today     I have ordered the following medication/changed the following medications:   Stop the following medications: There are no discontinued medications.   Start the following medications: No orders of the defined types were placed in this encounter.    Follow up: 3-4 months    Remember:   Should you have any questions or concerns please call the internal medicine clinic at [phone].     Rayann Atway, D.O. [facility]

## 2024-02-21 NOTE — Assessment & Plan Note (Signed)
 BP Readings from Last 3 Encounters:  02/21/24 (!) 159/77  02/12/24 (!) 146/75  01/23/24 (!) 162/75   Patient is currently taking Coreg  3.125 mg twice daily and amlodipine  5 mg daily.  Blood pressures at home SBP 125-130, HR 78-71. No CP or SOB.  - Stop amlodipine  5 mg - Start losartan  25 mg daily with underlying history of DM. - Continue Coreg  3.125 mg twice daily

## 2024-02-21 NOTE — Assessment & Plan Note (Signed)
 Per the last office visit, patient had a confluent red follicular rash on the upper lip and nasolabial folds.  Patient was prescribed ketoconazole  cream, per evaluation today, minimal red rash on the nasal fold with mild flakes.  - Continue ketoconazole  cream

## 2024-02-25 ENCOUNTER — Institutional Professional Consult (permissible substitution): Admitting: Cardiology

## 2024-02-29 LAB — COLOGUARD

## 2024-03-02 NOTE — Progress Notes (Unsigned)
 Cardiology Office Note    Date:  03/03/2024  ID:  Maria Rose, DOB 18-Sep-1955, MRN [id number] PCP:  Amilibia, Jaden, DO  Cardiologist:  Soyla DELENA Merck, MD  Electrophysiologist:  None   Chief Complaint: Follow up cryptogenic stroke   History of Present Illness: .    Maria Rose is a 68 y.o. 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  Patient seen on/9/25 by Dr. Merck for follow-up visit I after a stroke.  It was felt that her stroke was embolic and cryptogenic, patient had been on Eliquis  following the event.  It was noted the patient also has a history of varices and cirrhosis, as such TEE was not pursued previously.  Noted patient was a former smoker however quit a year ago.  Echocardiogram on 02/18/2024 indicated LVEF of 60 to 65%, no RWMA, diastolic parameters were normal, RV systolic function and size was normal, there was mild calcification of the aortic valve, regurgitation was not visualized, no stenosis present, agitated saline contrast bubble study was negative with no evidence of interatrial shunt.  Today she presents for follow-up.  She reports that she has been doing well.  She denies any cardiac concerns or complaints today.  She denies any chest pain, shortness of breath, lower extremity edema, orthopnea or PND.  She denies any palpitations or feeling of irregular or fast heartbeats, denies any presyncope or syncope.  Patient reports that she has been working towards weight loss, has cut sugar out of her diet with around a 50 pound weight loss in recent months.  Labwork independently reviewed: 01/08/2024: Sodium 138, potassium 3.6, creatinine 0.71, AST 26, ALT 28 11/26/2023: Hemoglobin 16, hematocrit 48.3 ROS: .   Today she denies chest pain, shortness of breath, lower extremity edema, fatigue, palpitations, melena, hematuria,  hemoptysis, diaphoresis, weakness, presyncope, syncope, orthopnea, and PND.  All other systems are reviewed and otherwise negative. Studies Reviewed: SABRA   EKG:  EKG is not ordered today.  CV Studies: Cardiac studies reviewed are outlined and summarized above. Otherwise please see EMR for full report. Cardiac Studies & Procedures   ______________________________________________________________________________________________     ECHOCARDIOGRAM  ECHOCARDIOGRAM COMPLETE BUBBLE STUDY 02/18/2024  Narrative ECHOCARDIOGRAM REPORT    Patient Name:   Maria Rose Date of Exam: 02/18/2024 Medical Rec #:  [id number]       Height:       66.5 in Accession #:    [id number]      Weight:       177.0 lb Date of Birth:  [birth year]        BSA:          1.909 m Patient Age:    68 years        BP:           159/91 mmHg Patient Gender: F               HR:           76 bpm. Exam Location:  [address]  Procedure: 2D Echo, Saline Contrast Bubble Study, 3D Echo and Strain Analysis (Both Spectral and Color Flow Doppler were utilized during procedure).  Indications:    I63.9 Stroke  History:        Patient has prior history of Echocardiogram examinations, most recent 07/31/2023. Risk Factors:Diabetes and Former Smoker. Anemia. Aortic atherosclerosis.  Sonographer:    Jon Hacker RCS Referring Phys:  [id number] GAYATRI A ACHARYA  IMPRESSIONS   1. Left ventricular ejection fraction, by estimation, is 60 to 65%. Left ventricular ejection fraction by 3D volume is 60 %. The left ventricle has normal function. The left ventricle has no regional wall motion abnormalities. Left ventricular diastolic parameters were normal. The average left ventricular global longitudinal strain is -23.0 %. The global longitudinal strain is normal. 2. Right ventricular systolic function is normal. The right ventricular size is normal. 3. The mitral valve is normal in structure. No evidence of mitral valve regurgitation. No  evidence of mitral stenosis. 4. The aortic valve is normal in structure. There is mild calcification of the aortic valve. Aortic valve regurgitation is not visualized. No aortic stenosis is present. 5. The inferior vena cava is normal in size with greater than 50% respiratory variability, suggesting right atrial pressure of 3 mmHg. 6. Agitated saline contrast bubble study was negative, with no evidence of any interatrial shunt.  FINDINGS Left Ventricle: Left ventricular ejection fraction, by estimation, is 60 to 65%. Left ventricular ejection fraction by 3D volume is 60 %. The left ventricle has normal function. The left ventricle has no regional wall motion abnormalities. The average left ventricular global longitudinal strain is -23.0 %. Strain was performed and the global longitudinal strain is normal. The left ventricular internal cavity size was normal in size. There is no left ventricular hypertrophy. Left ventricular diastolic parameters were normal.  Right Ventricle: The right ventricular size is normal. No increase in right ventricular wall thickness. Right ventricular systolic function is normal.  Left Atrium: Left atrial size was normal in size.  Right Atrium: Right atrial size was normal in size.  Pericardium: There is no evidence of pericardial effusion.  Mitral Valve: The mitral valve is normal in structure. No evidence of mitral valve regurgitation. No evidence of mitral valve stenosis.  Tricuspid Valve: The tricuspid valve is normal in structure. Tricuspid valve regurgitation is trivial. No evidence of tricuspid stenosis.  Aortic Valve: The aortic valve is normal in structure. There is mild calcification of the aortic valve. Aortic valve regurgitation is not visualized. No aortic stenosis is present.  Pulmonic Valve: The pulmonic valve was normal in structure. Pulmonic valve regurgitation is not visualized. No evidence of pulmonic stenosis.  Aorta: The aortic root is normal  in size and structure.  Venous: The inferior vena cava is normal in size with greater than 50% respiratory variability, suggesting right atrial pressure of 3 mmHg.  IAS/Shunts: No atrial level shunt detected by color flow Doppler. Agitated saline contrast was given intravenously to evaluate for intracardiac shunting. Agitated saline contrast bubble study was negative, with no evidence of any interatrial shunt.  Additional Comments: 3D was performed not requiring image post processing on an independent workstation and was normal.   LEFT VENTRICLE PLAX 2D LVIDd:         4.32 cm         Diastology LVIDs:         2.92 cm         LV e' medial:    7.40 cm/s LV PW:         1.13 cm         LV E/e' medial:  15.8 LV IVS:        1.01 cm         LV e' lateral:   9.90 cm/s LVOT diam:     2.00 cm         LV E/e' lateral: 11.8  LV SV:         75 LV SV Index:   39              2D Longitudinal LVOT Area:     3.14 cm        Strain 2D Strain GLS   -25.3 % (A4C): 2D Strain GLS   -21.6 % (A3C): 2D Strain GLS   -22.1 % (A2C): 2D Strain GLS   -23.0 % Avg:  3D Volume EF LV 3D EF:    Left ventricul ar ejection fraction by 3D volume is 60 %.  3D Volume EF: 3D EF:        60 % LV EDV:       122 ml LV ESV:       49 ml LV SV:        74 ml  RIGHT VENTRICLE RV Basal diam:  2.24 cm RV S prime:     13.30 cm/s TAPSE (M-mode): 2.2 cm  LEFT ATRIUM             Index        RIGHT ATRIUM          Index LA diam:        3.70 cm 1.94 cm/m   RA Area:     9.53 cm LA Vol (A2C):   36.6 ml 19.17 ml/m  RA Volume:   19.70 ml 10.32 ml/m LA Vol (A4C):   64.7 ml 33.89 ml/m LA Biplane Vol: 50.7 ml 26.55 ml/m AORTIC VALVE LVOT Vmax:   99.00 cm/s LVOT Vmean:  58.800 cm/s LVOT VTI:    0.238 m  AORTA Ao Root diam: 3.20 cm Ao Asc diam:  3.20 cm  MITRAL VALVE MV Area (PHT): 3.42 cm     SHUNTS MV Decel Time: 222 msec     Systemic VTI:  0.24 m MV E velocity: 117.00 cm/s  Systemic Diam: 2.00 cm MV A  velocity: 99.60 cm/s MV E/A ratio:  1.17  Aditya Sabharwal Electronically signed by Ria Commander Signature Date/Time: 02/18/2024/1:08:27 PM    Final    MONITORS  CARDIAC EVENT MONITOR 10/01/2023  Narrative Total monitoring time of 30 days. Average HR of 77 bpm, ranging from 59  to 122 bpm. Predominantly sinus rhythm. No atrial fibrillation, sustained ventricular tachyarrhythmias, or bradyarrhythmias were detected. Rare PVCs and PACs. Patient triggered events corresponded with sinus tachycardia.  SUMMARY:  Reassuring monitor.       ______________________________________________________________________________________________       Current Reported Medications:.    Current Meds  Medication Sig   amLODipine  (NORVASC ) 5 MG tablet Take 1 tablet (5 mg total) by mouth daily.   aspirin 81 MG chewable tablet Chew 81 mg by mouth daily.   atorvastatin  (LIPITOR) 10 MG tablet Take 1 tablet (10 mg total) by mouth daily.   Blood Glucose Monitoring Suppl DEVI 1 each by Does not apply route 3 (three) times daily. May dispense any manufacturer covered by patient's insurance.   carbamide peroxide (DEBROX) 6.5 % OTIC solution Place 5 drops into the left ear 2 (two) times daily.   carvedilol  (COREG ) 3.125 MG tablet Take 1 tablet (3.125 mg total) by mouth 2 (two) times daily with a meal.   clotrimazole  (LOTRIMIN ) 1 % cream Apply to affected area 2 times daily   dapagliflozin  propanediol (FARXIGA ) 10 MG TABS tablet Take 1 tablet (10 mg total) by mouth daily before breakfast.   Dulaglutide  (TRULICITY ) 0.75 MG/0.5ML SOAJ Inject 1.5 mg into the muscle  once a week.   erythromycin ophthalmic ointment Place 1 Application into the left eye 4 (four) times daily.   Ferrous Sulfate  (IRON ) 325 (65 Fe) MG TABS Take 1 tablet (325 mg total) by mouth daily.   Glucagon , rDNA, (GLUCAGON  EMERGENCY) 1 MG KIT Inject 1 mg into the skin as needed for up to 2 doses (Severe low blood sugar).   hydrocortisone   cream 1 % Apply to affected area 2 times daily for symptomatic relief   insulin  degludec (TRESIBA ) 100 UNIT/ML FlexTouch Pen Inject 23 Units into the skin daily.   Insulin  Pen Needle 32G X 4 MM MISC 1 Needle by Does not apply route daily.   ketoconazole  (NIZORAL ) 2 % cream Apply 1 Application topically daily.   Lancet Device MISC 1 each by Does not apply route 3 (three) times daily. May dispense any manufacturer covered by patient's insurance.   Lancets MISC 1 each by Does not apply route 3 (three) times daily. Use as directed to check blood sugar. May dispense any manufacturer covered by patient's insurance and fits patient's device.   losartan  (COZAAR ) 25 MG tablet Take 1 tablet (25 mg total) by mouth daily.   mupirocin  ointment (BACTROBAN ) 2 % Apply 1 Application topically 2 (two) times daily.   pantoprazole  (PROTONIX ) 40 MG tablet Take 1 tablet (40 mg total) by mouth daily.   pantoprazole  (PROTONIX ) 40 MG tablet Use for 4 weeks to heal esophagitis   Current Facility-Administered Medications for the 03/03/24 encounter (Office Visit) with Waseem Suess D, NP  Medication   diclofenac  Sodium (VOLTAREN ) 1 % topical gel 4 g    Physical Exam:    VS:  BP 120/60   Pulse 85   Ht 5' 6.5 (1.689 m)   Wt 182 lb (82.6 kg)   SpO2 95%   BMI 28.94 kg/m    Wt Readings from Last 3 Encounters:  03/03/24 182 lb (82.6 kg)  02/21/24 181 lb 3.2 oz (82.2 kg)  02/12/24 177 lb (80.3 kg)    GEN: Well nourished, well developed in no acute distress NECK: No JVD; No carotid bruits CARDIAC: RRR, no murmurs, rubs, gallops RESPIRATORY:  Clear to auscultation without rales, wheezing or rhonchi  ABDOMEN: Soft, non-tender, non-distended EXTREMITIES:  No edema; No acute deformity     Asessement and Plan:.    Cryptogenic stroke: Patient with embolic stroke with no source identified. Echo on 02/18/2024 showed no evidence of interatrial shunt.  Previous cardiac monitor showed no evidence of atrial fibrillation.  Patient denies any further events, denies any residuals.  She denies any feeling of palpitations.  Reviewed her recent echocardiogram, all questions were answered.  Discussed TEE given recent EGD, patient reports that she is not interested at this time. Patient to see Dr. Inocencio later this week for consideration of loop recorder. Patient previously transitioned from Eliquis  to aspirin by neurology.  Reviewed ED precautions.  Aortic atherosclerosis: Aortic arch and descending aortic atherosclerosis with plaque and calcium . Patient started on atorvastatin  10 mg daily at last office visit.  Check fasting lipid profile and LFTs.   HTN: Blood pressure today 120/60.  Continue amlodipine  5 mg daily, carvedilol  3.125 mg twice daily, losartan  25 mg daily. Monitored and managed per PCP.   Cirrhosis with varices: Increases atrial fibrillation risk and complicates anticoagulation.  TEE previously deferred due to contraindication.  CT in April showed significant improvement compared to prior imaging.  Patient recently underwent endoscopy, per patient report there was no evidence of bleeding varices and was  told she was stable.  Diabetes mellitus: Last A1c 5.5% on 02/21/24.  Monitored and managed per PCP.  Iron  deficiency anemia: Monitor managed per PCP.   Disposition: F/u with Dr. Loni in six months per patient request.   Signed, Corderius Saraceni D Tameem Pullara, NP

## 2024-03-03 ENCOUNTER — Ambulatory Visit: Attending: Cardiology | Admitting: Cardiology

## 2024-03-03 ENCOUNTER — Encounter: Payer: Self-pay | Admitting: Cardiology

## 2024-03-03 VITALS — BP 120/60 | HR 85 | Ht 66.5 in | Wt 182.0 lb

## 2024-03-03 DIAGNOSIS — Z79899 Other long term (current) drug therapy: Secondary | ICD-10-CM

## 2024-03-03 DIAGNOSIS — I700 Atherosclerosis of aorta: Secondary | ICD-10-CM | POA: Diagnosis not present

## 2024-03-03 DIAGNOSIS — I639 Cerebral infarction, unspecified: Secondary | ICD-10-CM | POA: Diagnosis not present

## 2024-03-03 DIAGNOSIS — I10 Essential (primary) hypertension: Secondary | ICD-10-CM

## 2024-03-03 DIAGNOSIS — D509 Iron deficiency anemia, unspecified: Secondary | ICD-10-CM

## 2024-03-03 DIAGNOSIS — E1100 Type 2 diabetes mellitus with hyperosmolarity without nonketotic hyperglycemic-hyperosmolar coma (NKHHC): Secondary | ICD-10-CM

## 2024-03-03 DIAGNOSIS — I864 Gastric varices: Secondary | ICD-10-CM

## 2024-03-03 NOTE — Patient Instructions (Signed)
 Medication Instructions:  No changes *If you need a refill on your cardiac medications before your next appointment, please call your pharmacy*  Lab Work: On Thursday we are going to draw a fasting Lipid panel and LFT's  If you have labs (blood work) drawn today and your tests are completely normal, you will receive your results only by: MyChart Message (if you have MyChart) OR A paper copy in the mail If you have any lab test that is abnormal or we need to change your treatment, we will call you to review the results.  Testing/Procedures: No testing  Follow-Up: At [facility], you and your health needs are our priority.  As part of our continuing mission to provide you with exceptional heart care, our providers are all part of one team.  This team includes your primary Cardiologist (physician) and Advanced Practice Providers or APPs (Physician Assistants and Nurse Practitioners) who all work together to provide you with the care you need, when you need it.  Your next appointment:   6 month(s)  Provider:   Gayatri A Acharya, MD    We recommend signing up for the patient portal called MyChart.  Sign up information is provided on this After Visit Summary.  MyChart is used to connect with patients for Virtual Visits (Telemedicine).  Patients are able to view lab/test results, encounter notes, upcoming appointments, etc.  Non-urgent messages can be sent to your provider as well.   To learn more about what you can do with MyChart, go to [email or web address].

## 2024-03-06 ENCOUNTER — Ambulatory Visit: Attending: Cardiology | Admitting: Cardiology

## 2024-03-06 ENCOUNTER — Encounter: Payer: Self-pay | Admitting: Cardiology

## 2024-03-06 VITALS — BP 140/80 | HR 82 | Ht 66.5 in | Wt 180.0 lb

## 2024-03-06 DIAGNOSIS — I639 Cerebral infarction, unspecified: Secondary | ICD-10-CM

## 2024-03-06 NOTE — Progress Notes (Signed)
 Electrophysiology Office Note:   Date:  03/06/2024  ID:  Maria Rose, DOB October 17, 1955, MRN [id number]  Primary Cardiologist: Maria DELENA Merck, MD Primary Heart Failure: None Electrophysiologist: None      History of Present Illness:   Maria Rose is a 68 y.o. female with h/o cryptogenic stroke, aortic atherosclerosis, cirrhosis with varices, diabetes, anemia, thrombocytopenia seen today for  for Electrophysiology evaluation of cryptogenic stroke at the request of Gayatri Acharya.    It was felt that her stroke was cryptogenic in nature and embolic.  She has been on Eliquis .  She does have a history of varices and cirrhosis.  TEE was not pursued.  Echo showed an ejection fraction of 60 to 65% with normal diastolic parameters.  Today, denies symptoms of palpitations, chest pain, dyspnea, orthopnea, PND, lower extremity edema, claudication, dizziness, presyncope, syncope, bleeding, or neurologic sequela. The patient is tolerating medications without difficulties.  Since her stroke she has been doing well.  She has almost completely recovered.  She has no chest pain or shortness of breath.  She is able to do her daily activities without restriction.  Review of systems complete and found to be negative unless listed in HPI.   EP Information / Studies Reviewed:    EKG is not ordered today. EKG from 11/14/2023 reviewed which showed sinus rhythm        Risk Assessment/Calculations:           Physical Exam:   VS:  There were no vitals taken for this visit.   Wt Readings from Last 3 Encounters:  03/03/24 182 lb (82.6 kg)  02/21/24 181 lb 3.2 oz (82.2 kg)  02/12/24 177 lb (80.3 kg)     GEN: Well nourished, well developed in no acute distress NECK: No JVD; No carotid bruits CARDIAC: Regular rate and rhythm, no murmurs, rubs, gallops RESPIRATORY:  Clear to auscultation without rales, wheezing or rhonchi  ABDOMEN: Soft, non-tender, non-distended EXTREMITIES:  No edema; No deformity    ASSESSMENT AND PLAN:    1.  Cryptogenic stroke: Thought embolic with no source identified.  No evidence of interatrial shunt on echo.  Had a previous cardiac monitor with no evidence of atrial fibrillation.  Has no palpitations.  She was recently transitioned from Eliquis  to aspirin by neurology.  She would benefit from ILR implant.  Risks and benefits have been discussed.  Risk include bleeding and infection.  She understands these risks and is agreed to the procedure.  2.  Aortic atherosclerosis: Plan per primary cardiology  3.  Hypertension: Currently well-controlled.  Plan per primary cardiology  Follow up with Dr. Inocencio pending ILR results   Signed, Jakyia Gaccione Gladis Inocencio, MD   He was atSURGEON:  Ahlia Lemanski Gladis Inocencio, MD     PREPROCEDURE DIAGNOSIS:  Cryptogenic stroke    POSTPROCEDURE DIAGNOSIS: Cryptogenic stroke     PROCEDURES:   1. Implantable loop recorder implantation    INTRODUCTION:  [name] presents with a history of cryptogenic stroke The costs of loop recorder monitoring have been discussed with the patient.    DESCRIPTION OF PROCEDURE:  Informed written consent was obtained.   Time Out Completed with RN    The patient required no sedation for the procedure today.  Mapping over the patient's chest was performed to identify the area where electrograms were most prominent for ILR recording.  This area was found to be the left parasternal region over the 4th intercostal space. The patients left chest was therefore prepped  and draped in the usual sterile fashion. The skin overlying the left parasternal region was infiltrated with lidocaine  for local analgesia.  A 0.5-cm incision was made over the left parasternal region over the 3rd intercostal space.  A subcutaneous ILR pocket was fashioned using a combination of sharp and blunt dissection.  A Medtronic Reveal LINQ 2 implantable loop recorder (serial [id number]) was then placed into the pocket  R waves were very  prominent and measuring 0.28.  Steri- Strips and a sterile dressing were then applied.  There were no early apparent complications.     CONCLUSIONS:   1. Successful implantation of a implantable loop recorder for a history of cryptogenic stroke  2. No early apparent complications.   Nishawn Rotan Gladis Norton, MD  Cardiac Electrophysiology

## 2024-03-06 NOTE — Patient Instructions (Signed)
Medication Instructions:  Your physician recommends that you continue on your current medications as directed. Please refer to the Current Medication list given to you today.  Labwork: None ordered.  Testing/Procedures: None ordered.  Follow-Up:  Your physician wants you to follow-up as needed with Dr. Camnitz    Implantable Loop Recorder Placement, Care After This sheet gives you information about how to care for yourself after your procedure. Your health care provider may also give you more specific instructions. If you have problems or questions, contact your health care provider. What can I expect after the procedure? After the procedure, it is common to have: Soreness or discomfort near the incision. Some swelling or bruising near the incision.  Follow these instructions at home: Incision care  Monitor your cardiac device site for redness, swelling, and drainage. Call the device clinic at [phone] if you experience these symptoms or fever/chills.  Keep the large square bandage on your site for 24 hours and then you may remove it yourself. Keep the steri-strips underneath in place.   You may shower after 72 hours / 3 days from your procedure with the steri-strips in place. They will usually fall off on their own, or may be removed after 10 days. Pat dry.   Avoid lotions, ointments, or perfumes over your incision until it is well-healed.  Please do not submerge in water until your site is completely healed.   Your device is MRI compatible.   Remote monitoring is used to monitor your cardiac device from home. This monitoring is scheduled every month by our office. It allows us to keep an eye on the function of your device to ensure it is working properly.  If your wound site starts to bleed apply pressure.    For help with the monitor please call Medtronic Monitor Support Specialist directly at [phone].    If you have any questions/concerns please call the device  clinic at [phone].  Activity  Return to your normal activities.  General instructions Follow instructions from your health care provider about how to manage your implantable loop recorder and transmit the information. Learn how to activate a recording if this is necessary for your type of device. You may go through a metal detection gate, and you may let someone hold a metal detector over your chest. Show your ID card if needed. Do not have an MRI unless you check with your health care provider first. Take over-the-counter and prescription medicines only as told by your health care provider. Keep all follow-up visits as told by your health care provider. This is important. Contact a health care provider if: You have redness, swelling, or pain around your incision. You have a fever. You have pain that is not relieved by your pain medicine. You have triggered your device because of fainting (syncope) or because of a heartbeat that feels like it is racing, slow, fluttering, or skipping (palpitations). Get help right away if you have: Chest pain. Difficulty breathing. Summary After the procedure, it is common to have soreness or discomfort near the incision. Change your dressing as told by your health care provider. Follow instructions from your health care provider about how to manage your implantable loop recorder and transmit the information. Keep all follow-up visits as told by your health care provider. This is important. This information is not intended to replace advice given to you by your health care provider. Make sure you discuss any questions you have with your health care provider. Document Released: 07/05/2015   Document Revised: 09/08/2017 Document Reviewed: 09/08/2017 Elsevier Patient Education  2020 Elsevier Inc.  

## 2024-03-07 LAB — LIPID PANEL
Chol/HDL Ratio: 3.1 ratio (ref 0.0–4.4)
Cholesterol, Total: 192 mg/dL (ref 100–199)
HDL: 62 mg/dL (ref >39)
LDL Chol Calc (NIH): 112 mg/dL — ABNORMAL HIGH (ref 0–99)
Triglycerides: 101 mg/dL (ref 0–149)
VLDL Cholesterol Cal: 18 mg/dL (ref 5–40)

## 2024-03-07 LAB — HEPATIC FUNCTION PANEL
ALT: 24 IU/L (ref 0–32)
AST: 22 IU/L (ref 0–40)
Albumin: 4.9 g/dL (ref 3.9–4.9)
Alkaline Phosphatase: 61 IU/L (ref 44–121)
Bilirubin Total: 0.6 mg/dL (ref 0.0–1.2)
Bilirubin, Direct: 0.21 mg/dL (ref 0.00–0.40)
Total Protein: 7.6 g/dL (ref 6.0–8.5)

## 2024-03-09 ENCOUNTER — Ambulatory Visit: Payer: Self-pay | Admitting: Cardiology

## 2024-03-09 DIAGNOSIS — Z79899 Other long term (current) drug therapy: Secondary | ICD-10-CM

## 2024-03-11 MED ORDER — ATORVASTATIN CALCIUM 20 MG PO TABS: 20.0000 mg | ORAL_TABLET | Freq: Every day | ORAL | 3 refills | Status: DC

## 2024-03-11 NOTE — Telephone Encounter (Signed)
-----   Message from Katlyn D West sent at 03/09/2024 10:14 AM EDT ----- Please let Ms. Beissel know that her LDL remains elevated and is above goal. Her liver function is normal. Recommend increasing her atorvastatin  to 20 mg daily, she will need a repeat fasting lipid  profile and LFTs in 6-8 weeks.  ----- Message ----- From: Rebecka Memos Lab Results In Sent: 03/07/2024   6:37 AM EDT To: Katlyn D West, NP

## 2024-03-11 NOTE — Telephone Encounter (Signed)
 Called patient advised of below they verbalized understanding.

## 2024-03-17 ENCOUNTER — Other Ambulatory Visit: Payer: Self-pay | Admitting: Student

## 2024-03-24 ENCOUNTER — Telehealth: Payer: Self-pay

## 2024-03-24 NOTE — Telephone Encounter (Signed)
 Maria Rose (Key: [id number]) PA Case ID #: [id number] Rx #: [id number] Need Help? Call us  at [phone] Outcome N/A today by [employer] 2017 This request has been closed by the Patient's Pharmacy Benefit Manager. Call Pharmacy Customer Care at [phone]. PA Not Required. Drug Trulicity  0.75MG /0.5ML auto-injectors ePA cloud logo Form [employer] ePA Form [phone] NCPDP) Original Claim Info 6 Member needs to enroll in copay assistance. Once enrolled, please contact customer care.Plan Limitations Exceeded

## 2024-03-24 NOTE — Telephone Encounter (Signed)
 Prior Authorization for patient (Trulicity  0.75MG /0.5ML auto-injectors) came through on cover my meds was submitted with last office notes and labs awaiting approval or denial.  XZB:[id number]

## 2024-03-25 ENCOUNTER — Telehealth: Payer: Self-pay

## 2024-03-25 ENCOUNTER — Other Ambulatory Visit: Payer: Self-pay | Admitting: Student

## 2024-03-25 MED ORDER — TRULICITY 1.5 MG/0.5ML ~~LOC~~ SOAJ
1.5000 mg | SUBCUTANEOUS | 2 refills | Status: DC
Start: 2024-03-25 — End: 2024-03-25

## 2024-03-25 MED ORDER — TRULICITY 1.5 MG/0.5ML ~~LOC~~ SOAJ: 1.5000 mg | SUBCUTANEOUS | 2 refills | Status: DC

## 2024-03-25 NOTE — Telephone Encounter (Unsigned)
 Copied from CRM [id number]. Topic: Clinical - Medication Question >> Mar 25, 2024  2:33 PM Farrel B wrote: Reason for CRM: Ms. Edsel called from [facility] in regard to the Trulicity  she stated they are trying to fill the medication request but need the diagnosis code for the patient. Please call [facility] pharmacy at [phone]. Ms. Edsel stated she would be leaving soon but anyone can take the information requested.

## 2024-03-25 NOTE — Telephone Encounter (Unsigned)
 Copied from CRM #[id number]. Topic: Clinical - Medication Refill >> Mar 25, 2024  9:41 AM Zane F wrote: Patient is contacting the office and stated that she is out of the listed prescription and after her last visit on ... She was informed the dosage should increase from the .75 mg to 100 or  1 mg. Please review visit notes to confirm and refill proper dosage.   Callback Number: [id number]   Medication: Dulaglutide  (TRULICITY ) 0.75 MG/0.5ML SOAJ  Has the patient contacted their pharmacy? Yes   This is the patient's preferred pharmacy:  [facility] [address], [place] - [address] [place] [address] Phone: [phone] Fax: [phone]  Is this the correct pharmacy for this prescription? Yes  Has the prescription been filled recently? No  Is the patient out of the medication? Yes  Has the patient been seen for an appointment in the last year OR does the patient have an upcoming appointment? Yes  Can we respond through MyChart? Yes  Agent: Please be advised that Rx refills may take up to 3 business days. We ask that you follow-up with your pharmacy.

## 2024-03-25 NOTE — Telephone Encounter (Signed)
 Dr Marylu has re-sent rx with dx code.

## 2024-04-09 ENCOUNTER — Ambulatory Visit

## 2024-04-09 DIAGNOSIS — I639 Cerebral infarction, unspecified: Secondary | ICD-10-CM

## 2024-04-09 LAB — CUP PACEART REMOTE DEVICE CHECK
Date Time Interrogation Session: 20250831195712
Implantable Pulse Generator Implant Date: 20250731

## 2024-04-10 ENCOUNTER — Ambulatory Visit: Payer: Self-pay | Admitting: Cardiology

## 2024-04-15 NOTE — Progress Notes (Signed)
 Remote Loop Recorder Transmission

## 2024-04-18 ENCOUNTER — Other Ambulatory Visit: Payer: Self-pay | Admitting: Student

## 2024-05-08 ENCOUNTER — Ambulatory Visit: Payer: Self-pay | Admitting: Cardiology

## 2024-05-08 LAB — CUP PACEART REMOTE DEVICE CHECK
Date Time Interrogation Session: 20251001233610
Implantable Pulse Generator Implant Date: 20250731

## 2024-05-12 ENCOUNTER — Ambulatory Visit (HOSPITAL_COMMUNITY)
Admission: RE | Admit: 2024-05-12 | Discharge: 2024-05-12 | Disposition: A | Discharge status: Home / Self Care | Source: Ambulatory Visit | Attending: Gastroenterology | Admitting: Gastroenterology

## 2024-05-12 ENCOUNTER — Ambulatory Visit (INDEPENDENT_AMBULATORY_CARE_PROVIDER_SITE_OTHER)

## 2024-05-12 DIAGNOSIS — K7460 Unspecified cirrhosis of liver: Secondary | ICD-10-CM | POA: Insufficient documentation

## 2024-05-12 DIAGNOSIS — I639 Cerebral infarction, unspecified: Secondary | ICD-10-CM | POA: Diagnosis not present

## 2024-05-12 NOTE — Progress Notes (Signed)
 Remote Loop Recorder Transmission

## 2024-05-13 ENCOUNTER — Other Ambulatory Visit: Payer: Self-pay

## 2024-05-15 ENCOUNTER — Ambulatory Visit: Payer: Self-pay | Admitting: Gastroenterology

## 2024-05-15 NOTE — Progress Notes (Signed)
 Ms. Dowling,  Your ultrasound showed no concerning lesions for liver cancer.  Plan to repeat ultrasound and labs in 6 months.  Alan,  Please place reminder for RUQUS, CBC, CMP, AFP and INR in 6 months.

## 2024-06-12 ENCOUNTER — Encounter

## 2024-06-13 ENCOUNTER — Ambulatory Visit (INDEPENDENT_AMBULATORY_CARE_PROVIDER_SITE_OTHER)

## 2024-06-13 DIAGNOSIS — I639 Cerebral infarction, unspecified: Secondary | ICD-10-CM

## 2024-06-15 LAB — CUP PACEART REMOTE DEVICE CHECK
Date Time Interrogation Session: 20251107003832
Implantable Pulse Generator Implant Date: 20250731

## 2024-06-16 ENCOUNTER — Ambulatory Visit: Admitting: Neurology

## 2024-06-16 ENCOUNTER — Encounter: Payer: Self-pay | Admitting: Neurology

## 2024-06-16 VITALS — BP 137/77 | HR 88 | Ht 66.0 in | Wt 189.0 lb

## 2024-06-16 DIAGNOSIS — K55069 Acute infarction of intestine, part and extent unspecified: Secondary | ICD-10-CM | POA: Diagnosis not present

## 2024-06-16 DIAGNOSIS — Z8673 Personal history of transient ischemic attack (TIA), and cerebral infarction without residual deficits: Secondary | ICD-10-CM

## 2024-06-16 NOTE — Progress Notes (Signed)
 PATIENT: Maria Rose DOB: August 30, 1955  REASON FOR VISIT: follow up HISTORY FROM: patient PRIMARY NEUROLOGIST: Dr. Rosemarie  Chief Complaint  Patient presents with   RM17/CVA    Pt is here Alone. Pt states that she has been doing good since her last appointment.      HISTORY OF PRESENT ILLNESS: Today 06/16/24 she returns for follow-up after last visit with Duwaine Eriksson, NP 8 months ago.  Patient states she is doing well.  She has had no recurrent stroke or TIA symptoms.  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  She is tolerating aspirin well without bruising or bleeding.  Her blood pressure is under good control and today it is 137/77.  She remains on Lipitor which is tolerating well without side effects.  She states her lipid profile was satisfactory by my review on 03/06/2024 LDL cholesterol was 112 mg percent.  And she does have an upcoming visit to see primary care physician next week where she will have it checked again.  She states that sugars are also under good control.  She has no complaints today.  She has not had any recurrent stroke or TIA symptoms.  Loop recorder interrogation has so far not revealed paroxysmal A-fib  Prior visit 10/18/2023 Johnnie Eriksson, NP) Maria Rose is a 68 y.o. 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  Returns today for follow-up.  She reports that since her stroke she feels that she is back to her normal.  She states that she was originally having blurry vision in the left eye but that has improved.  She will be making an appointment with her eye doctor to follow-up.  She states that her hemoglobin A1c was recently checked and it had decreased to 6.9.  Currently not on any medication for cholesterol as her LDL has been in normal range.  Blood pressure today is in normal range.  She did wear a cardiac monitor that did not show A-fib.  She did have echocardiogram  with bubble study that did not show endocarditis.  She is currently on Eliquis  due to a superior mesenteric vein mural thrombus. overall she feels that she is doing well.  She returns today for an evaluation.    CTA head and neck: IMPRESSION: 1. No large vessel occlusion. 2. Intracranial atherosclerosis with mild bilateral ICA stenoses. 3. Cervical carotid atherosclerosis without significant stenosis. 4.  Aortic Atherosclerosis (ICD10-I70.0).  MRI brain: IMPRESSION: 1. Acute to early subacute infarct in the right posterior temporal and lateral occipital and parietal cortex, with associated edema and petechial hemorrhage. 2. Additional punctate acute infarcts in the right cerebellum, left temporal lobe, left occipital lobe, right occipital lobe, right parietal lobe, right frontal lobe, right thalamus, and left frontal lobe. These foci are both in the white matter and cortex. The distribution is suggestive of a central embolic phenomenon.     REVIEW OF SYSTEMS: Out of a complete 14 system review of symptoms, the patient complains only of the following symptoms, and all other reviewed systems are negative.  ALLERGIES: No Known Allergies  HOME MEDICATIONS: Outpatient Medications Prior to Visit  Medication Sig Dispense Refill   amLODipine  (NORVASC ) 5 MG tablet Take 1 tablet (5 mg total) by mouth daily. 30 tablet 11   aspirin 81 MG chewable tablet Chew 81 mg by mouth daily.     atorvastatin  (LIPITOR) 20 MG tablet Take 1 tablet (20 mg total)  by mouth daily. 90 tablet 3   Blood Glucose Monitoring Suppl DEVI 1 each by Does not apply route 3 (three) times daily. May dispense any manufacturer covered by patient's insurance. 1 each 2   carvedilol  (COREG ) 3.125 MG tablet Take 1 tablet (3.125 mg total) by mouth 2 (two) times daily with a meal. 180 tablet 1   dapagliflozin  propanediol (FARXIGA ) 10 MG TABS tablet Take 1 tablet (10 mg total) by mouth daily before breakfast. 30 tablet 11    Dulaglutide  (TRULICITY ) 1.5 MG/0.5ML SOAJ Inject 1.5 mg into the skin once a week. 2 mL 2   ferrous sulfate  (FEROSUL) 325 (65 FE) MG tablet Take 1 tablet by mouth once daily 30 tablet 3   Glucagon , rDNA, (GLUCAGON  EMERGENCY) 1 MG KIT Inject 1 mg into the skin as needed for up to 2 doses (Severe low blood sugar). 1 kit 1   insulin  degludec (TRESIBA ) 100 UNIT/ML FlexTouch Pen Inject 23 Units into the skin daily. 3 mL 11   Insulin  Pen Needle 32G X 4 MM MISC 1 Needle by Does not apply route daily. 100 each 11   Lancet Device MISC 1 each by Does not apply route 3 (three) times daily. May dispense any manufacturer covered by patient's insurance. 1 each 3   Lancets MISC 1 each by Does not apply route 3 (three) times daily. Use as directed to check blood sugar. May dispense any manufacturer covered by patient's insurance and fits patient's device. 1 each 2   losartan  (COZAAR ) 25 MG tablet Take 1 tablet (25 mg total) by mouth daily. 60 tablet 2   pantoprazole  (PROTONIX ) 40 MG tablet Take 1 tablet (40 mg total) by mouth daily. 90 tablet 3   carbamide peroxide (DEBROX) 6.5 % OTIC solution Place 5 drops into the left ear 2 (two) times daily. (Patient not taking: Reported on 06/16/2024) 15 mL 2   clotrimazole  (LOTRIMIN ) 1 % cream Apply to affected area 2 times daily (Patient not taking: Reported on 06/16/2024) 60 g 1   erythromycin ophthalmic ointment Place 1 Application into the left eye 4 (four) times daily. (Patient not taking: Reported on 06/16/2024)     hydrocortisone  cream 1 % Apply to affected area 2 times daily for symptomatic relief (Patient not taking: Reported on 06/16/2024) 30 g 1   ketoconazole  (NIZORAL ) 2 % cream Apply 1 Application topically daily. (Patient not taking: Reported on 06/16/2024) 15 g 0   mupirocin  ointment (BACTROBAN ) 2 % Apply 1 Application topically 2 (two) times daily. 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   Stroke (HCC) 07/31/2023    PAST SURGICAL HISTORY: Past Surgical History:  Procedure Laterality Date   ESOPHAGOGASTRODUODENOSCOPY (EGD) WITH PROPOFOL  N/A 07/31/2023   Procedure: ESOPHAGOGASTRODUODENOSCOPY (EGD) WITH PROPOFOL ;  Surgeon: Stacia Glendia BRAVO, MD;  Location: [facility] ENDOSCOPY;  Service: Gastroenterology;  Laterality: N/A;   HEMOSTASIS CONTROL  07/31/2023   Procedure: HEMOSTASIS CONTROL;  Surgeon: Stacia Glendia BRAVO, MD;  Location: [facility] ENDOSCOPY;  Service: Gastroenterology;;   IR ANGIOGRAM VISCERAL SELECTIVE  08/07/2023   IR ANGIOGRAM VISCERAL SELECTIVE  08/07/2023   IR EMBO VENOUS NOT HEMORR HEMANG  INC GUIDE ROADMAPPING  08/05/2023   IR RADIOLOGIST EVAL & MGMT  11/27/2023   IR RENAL SELECTIVE  UNI INC S&I MOD SED  08/07/2023   IR US  GUIDE VASC ACCESS RIGHT  08/05/2023   RADIOLOGY WITH ANESTHESIA N/A 08/05/2023   Procedure: IR WITH ANESTHESIA -TIPS;  Surgeon: Radiologist, Medication, MD;  Location: [facility] OR;  Service: Radiology;  Laterality: N/A;    FAMILY HISTORY: Family History  Problem Relation Age of Onset   Hypertension Father    Colon cancer Neg Hx    Stomach cancer Neg Hx    Esophageal cancer Neg Hx     SOCIAL HISTORY: Social History   Socioeconomic History   Marital status: Married    Spouse name: Not on file   Number of children: Not on file   Years of education: Not on file   Highest education level: Not on file  Occupational History   Not on file  Tobacco Use   Smoking status: Former    Types: Cigarettes    Smokeless tobacco: Never  Vaping Use   Vaping status: Never Used  Substance and Sexual Activity   Alcohol use: Not Currently   Drug use: Never   Sexual activity: Not Currently  Other Topics Concern   Not on file  Social History Narrative   Not on file   Social Drivers of Health   Financial Resource Strain: Not on file  Food Insecurity: No Food Insecurity (07/30/2023)   Hunger Vital Sign    Worried About Running Out of Food in the Last Year: Never true    Ran Out of Food in the Last Year: Never true  Transportation Needs: No Transportation Needs (07/30/2023)   PRAPARE - Administrator, Civil Service (Medical): No    Lack of Transportation (Non-Medical): No  Physical Activity: Not on file  Stress: Not on file  Social Connections: Unknown (05/14/2023)   Received from [facility]   Social Network    Social Network: Not on file  Intimate Partner Violence: Not At Risk (07/30/2023)   Humiliation, Afraid, Rape, and Kick questionnaire    Fear of Current or Ex-Partner: No    Emotionally Abused: No    Physically Abused: No    Sexually Abused: No      PHYSICAL EXAM  Vitals:   06/16/24 1258  BP: 137/77  Pulse: 88  SpO2: 98%  Weight: 189 lb (85.7 kg)  Height: 5' 6 (1.676 m)   Body mass index is 30.51 kg/m.  Generalized: Pleasant middle-aged Caucasian lady in no acute distress   Neurological examination  Mentation: Alert oriented to time, place, history taking. Follows all commands speech and language fluent Cranial nerve II-XII: Pupils were equal round reactive to light. Extraocular movements were full, visual field were full on confrontational test. Facial sensation and strength were normal. Uvula tongue midline. Head turning and shoulder shrug  were normal and symmetric. Motor: The motor testing reveals 5 over 5 strength of all 4 extremities. Good symmetric motor tone is noted throughout.  Sensory: Sensory testing is intact to soft touch on all 4  extremities. No evidence of extinction is noted.  Coordination: Cerebellar testing reveals good finger-nose-finger and heel-to-shin bilaterally.  Gait and station: Gait is normal.  Some difficulty with tandem walking  DIAGNOSTIC DATA (LABS, IMAGING, TESTING) - I reviewed patient records, labs, notes, testing and imaging myself where available.  Lab Results  Component Value Date   WBC 6.8 11/26/2023   HGB 16.0 (H) 11/26/2023   HCT 48.3 (H) 11/26/2023   MCV 79 11/26/2023   PLT 103 (L) 11/26/2023      Component Value Date/Time   NA 138 01/08/2024 1010   K 3.6 01/08/2024 1010   CL 100 01/08/2024 1010   CO2 29 01/08/2024 1010   GLUCOSE 112 (H) 01/08/2024 1010   BUN 16 01/08/2024 1010   CREATININE 0.71 01/08/2024 1010   CALCIUM  9.6 01/08/2024 1010   PROT 7.6 03/06/2024 1307   ALBUMIN  4.9 03/06/2024 1307   AST 22 03/06/2024 1307   ALT 24 03/06/2024 1307   ALKPHOS 61 03/06/2024 1307   BILITOT 0.6 03/06/2024 1307   GFRNONAA >60 08/09/2023 1031   Lab Results  Component Value Date   CHOL 192 03/06/2024   HDL 62 03/06/2024   LDLCALC 112 (H) 03/06/2024   TRIG 101 03/06/2024   CHOLHDL 3.1 03/06/2024   Lab Results  Component Value Date   HGBA1C 5.5 02/21/2024   No results found for: VITAMINB12 Lab Results  Component Value Date   TSH 3.586 07/31/2023      ASSESSMENT AND PLAN 68 y.o. year old female  has a past medical history of Arthritis, Diabetes mellitus without complication (HCC), GI bleed (07/31/2023), Splenic varices (07/31/2023), and Stroke (HCC) (07/31/2023). 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  Vascular risk factors of diabetes, hypertension and hyperlipidemia embolic stroke    I had a long d/w patient about her remote cryptogenic stroke, history of mesenteric vein thrombosis, risk for recurrent stroke/TIAs, personally  independently reviewed imaging studies and stroke evaluation results and answered questions.Continue aspirin 81 mg daily  for secondary stroke prevention and maintain strict control of hypertension with blood pressure goal below 130/90, diabetes with hemoglobin A1c goal below 6.5% and lipids with LDL cholesterol goal below 70 mg/dL. I also advised the patient to eat a healthy diet with plenty of whole grains, cereals, fruits and vegetables, exercise regularly and maintain ideal body weight Followup in the future with me only as needed and no scheduled appointment was made.      I personally spent a total of 35 minutes in the care of the patient today including getting/reviewing separately obtained history, performing a medically appropriate exam/evaluation, counseling and educating, placing orders, referring and communicating with other health care professionals, documenting clinical information in the EHR, independently interpreting results, and coordinating care.             Eather Popp, MD 06/16/2024, 1:05 PM [facility] [address] [place], [place] [address] [phone]

## 2024-06-16 NOTE — Patient Instructions (Signed)
 I had a long d/w patient about her remote cryptogenic stroke, history of mesenteric vein thrombosis, risk for recurrent stroke/TIAs, personally independently reviewed imaging studies and stroke evaluation results and answered questions.Continue aspirin 81 mg daily  for secondary stroke prevention and maintain strict control of hypertension with blood pressure goal below 130/90, diabetes with hemoglobin A1c goal below 6.5% and lipids with LDL cholesterol goal below 70 mg/dL. I also advised the patient to eat a healthy diet with plenty of whole grains, cereals, fruits and vegetables, exercise regularly and maintain ideal body weight Followup in the future with me only as needed and no scheduled appointment was made.

## 2024-06-17 ENCOUNTER — Ambulatory Visit: Payer: Self-pay | Admitting: Cardiology

## 2024-06-17 NOTE — Progress Notes (Signed)
 Remote Loop Recorder Transmission

## 2024-06-23 ENCOUNTER — Other Ambulatory Visit: Payer: Self-pay

## 2024-06-23 ENCOUNTER — Ambulatory Visit: Admitting: Student

## 2024-06-23 ENCOUNTER — Encounter: Payer: Self-pay | Admitting: Student

## 2024-06-23 VITALS — BP 136/65 | HR 82 | Temp 98.1°F | Ht 66.0 in | Wt 189.6 lb

## 2024-06-23 DIAGNOSIS — Z8249 Family history of ischemic heart disease and other diseases of the circulatory system: Secondary | ICD-10-CM

## 2024-06-23 DIAGNOSIS — Z79899 Other long term (current) drug therapy: Secondary | ICD-10-CM

## 2024-06-23 DIAGNOSIS — Z0000 Encounter for general adult medical examination without abnormal findings: Secondary | ICD-10-CM

## 2024-06-23 DIAGNOSIS — E1142 Type 2 diabetes mellitus with diabetic polyneuropathy: Secondary | ICD-10-CM

## 2024-06-23 DIAGNOSIS — E1169 Type 2 diabetes mellitus with other specified complication: Secondary | ICD-10-CM | POA: Diagnosis not present

## 2024-06-23 DIAGNOSIS — Z794 Long term (current) use of insulin: Secondary | ICD-10-CM

## 2024-06-23 DIAGNOSIS — I10 Essential (primary) hypertension: Secondary | ICD-10-CM | POA: Diagnosis not present

## 2024-06-23 DIAGNOSIS — Z23 Encounter for immunization: Secondary | ICD-10-CM

## 2024-06-23 DIAGNOSIS — Z87891 Personal history of nicotine dependence: Secondary | ICD-10-CM

## 2024-06-23 DIAGNOSIS — E1140 Type 2 diabetes mellitus with diabetic neuropathy, unspecified: Secondary | ICD-10-CM | POA: Insufficient documentation

## 2024-06-23 LAB — POCT GLYCOSYLATED HEMOGLOBIN (HGB A1C): HbA1c, POC (controlled diabetic range): 5.9 % (ref 0.0–7.0)

## 2024-06-23 LAB — GLUCOSE, CAPILLARY: Glucose-Capillary: 110 mg/dL — ABNORMAL HIGH (ref 70–99)

## 2024-06-23 MED ORDER — AMLODIPINE BESYLATE 10 MG PO TABS: 10.0000 mg | ORAL_TABLET | Freq: Every day | ORAL | 3 refills | Status: AC

## 2024-06-23 MED ORDER — INSULIN DEGLUDEC 100 UNIT/ML ~~LOC~~ SOPN: 20.0000 [IU] | PEN_INJECTOR | Freq: Every day | SUBCUTANEOUS | Status: DC

## 2024-06-23 MED ORDER — GABAPENTIN 100 MG PO CAPS: 100.0000 mg | ORAL_CAPSULE | Freq: Every day | ORAL | 2 refills | Status: AC

## 2024-06-23 NOTE — Assessment & Plan Note (Signed)
 Flu vaccine done today

## 2024-06-23 NOTE — Assessment & Plan Note (Addendum)
 Lab Results  Component Value Date   HGBA1C 5.9 06/23/2024   HGBA1C 5.5 02/21/2024   HGBA1C 6.8 (A) 11/26/2023   Chronic, stable. No hypoglycemia. Encouraged for positive lifestyle changes including diet lower in carbohydrates and sugar. Continue Trulicity  1.5 mg weekly, Tresiba  20 units daily and Farxiga  10 mg daily.

## 2024-06-23 NOTE — Patient Instructions (Signed)
 Remember to bring all of the medications that you take (including over the counter medications and supplements) with you to every clinic visit.  This after visit summary is an important review of tests, referrals, and medication changes that were discussed during your visit. If you have questions or concerns, call [phone]. Outside of clinic business hours, call the main hospital at [phone] and ask the operator for the on-call internal medicine resident.   Ozell Kung MD 06/23/2024, 10:37 AM

## 2024-06-23 NOTE — Progress Notes (Unsigned)
 Patient name: Maria Rose Date of birth: 30-Dec-1955 Date of visit: 06/24/24  Subjective:  Maria Rose is here for a periodic preventive medicine exam.  No new or worsening symptoms. Review of general health and wellness concerns, lifestyle, and risk factors discussed.  Diabetes and high blood pressure were also addressed.    No Known Allergies  Current Outpatient Medications  Medication Instructions   amLODipine  (NORVASC ) 10 mg, Oral, Daily   aspirin 81 mg, Daily   atorvastatin  (LIPITOR) 20 mg, Oral, Daily   Blood Glucose Monitoring Suppl DEVI 1 each, Does not apply, 3 times daily, May dispense any manufacturer covered by patient's insurance.   carvedilol  (COREG ) 3.125 mg, Oral, 2 times daily with meals   dapagliflozin  propanediol (FARXIGA ) 10 mg, Oral, Daily before breakfast   FeroSul 325 mg, Oral, Daily   gabapentin (NEURONTIN) 100 mg, Oral, Daily at bedtime   Glucagon  Emergency 1 mg, Subcutaneous, As needed   insulin  degludec (TRESIBA ) 20 Units, Subcutaneous, Daily   Insulin  Pen Needle 32G X 4 MM MISC 1 Needle, Does not apply, Daily   ketoconazole  (NIZORAL ) 2 % cream 1 Application, Topical, Daily   Lancet Device MISC 1 each, Does not apply, 3 times daily, May dispense any manufacturer covered by patient's insurance.   Lancets MISC 1 each, Does not apply, 3 times daily, Use as directed to check blood sugar. May dispense any manufacturer covered by patient's insurance and fits patient's device.   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   Stroke (HCC) 07/31/2023    Past Surgical History:  Procedure Laterality Date   ESOPHAGOGASTRODUODENOSCOPY (EGD) WITH PROPOFOL  N/A 07/31/2023   Procedure:  ESOPHAGOGASTRODUODENOSCOPY (EGD) WITH PROPOFOL ;  Surgeon: Stacia Glendia BRAVO, MD;  Location: [facility] ENDOSCOPY;  Service: Gastroenterology;  Laterality: N/A;   HEMOSTASIS CONTROL  07/31/2023   Procedure: HEMOSTASIS CONTROL;  Surgeon: Stacia Glendia BRAVO, MD;  Location: [facility] ENDOSCOPY;  Service: Gastroenterology;;   IR ANGIOGRAM VISCERAL SELECTIVE  08/07/2023   IR ANGIOGRAM VISCERAL SELECTIVE  08/07/2023   IR EMBO VENOUS NOT HEMORR HEMANG  INC GUIDE ROADMAPPING  08/05/2023   IR RADIOLOGIST EVAL & MGMT  11/27/2023   IR RENAL SELECTIVE  UNI INC S&I MOD SED  08/07/2023   IR US  GUIDE VASC ACCESS RIGHT  08/05/2023   RADIOLOGY WITH ANESTHESIA N/A 08/05/2023   Procedure: IR WITH ANESTHESIA -TIPS;  Surgeon: Radiologist, Medication, MD;  Location: [facility] OR;  Service: Radiology;  Laterality: N/A;    Family History  Problem Relation Age of Onset   Hypertension Father    Colon cancer Neg Hx    Stomach cancer Neg Hx    Esophageal cancer Neg Hx     Social History   Socioeconomic History   Marital status: Married    Spouse name: Not on file   Number of children: Not on file   Years of education: Not on file   Highest education level: Not on file  Occupational History   Not on file  Tobacco Use   Smoking status: Former    Types: Cigarettes   Smokeless tobacco: Never  Vaping Use   Vaping status: Never Used  Substance and Sexual Activity   Alcohol use: Not Currently  Drug use: Never   Sexual activity: Not Currently  Other Topics Concern   Not on file  Social History Narrative   Not on file   Social Drivers of Health   Financial Resource Strain: Not on file  Food Insecurity: No Food Insecurity (07/30/2023)   Hunger Vital Sign    Worried About Running Out of Food in the Last Year: Never true    Ran Out of Food in the Last Year: Never true  Transportation Needs: No Transportation Needs (07/30/2023)   PRAPARE - Administrator, Civil Service (Medical): No    Lack of Transportation  (Non-Medical): No  Physical Activity: Not on file  Stress: Not on file  Social Connections: Not on file  Intimate Partner Violence: Not At Risk (07/30/2023)   Humiliation, Afraid, Rape, and Kick questionnaire    Fear of Current or Ex-Partner: No    Emotionally Abused: No    Physically Abused: No    Sexually Abused: No      Objective: Today's Vitals   06/23/24 0943  BP: 136/65  Pulse: 82  Temp: 98.1 F (36.7 C)  TempSrc: Oral  SpO2: 97%  Weight: 189 lb 9.6 oz (86 kg)  Height: 5' 6 (1.676 m)  Body mass index is 30.6 kg/m.   Physical Exam Constitutional:      General: She is not in acute distress. Cardiovascular:     Rate and Rhythm: Normal rate and regular rhythm.     Pulses: Normal pulses.     Heart sounds: No murmur heard. Pulmonary:     Effort: Pulmonary effort is normal.     Breath sounds: Normal breath sounds.  Musculoskeletal:        General: No swelling.  Skin:    General: Skin is warm and dry.     Capillary Refill: Capillary refill takes less than 2 seconds.  Neurological:     Mental Status: She is oriented to person, place, and time.    Diabetic Foot Exam - Simple   Simple Foot Form Diabetic Foot exam was performed with the following findings: Yes 06/23/2024  9:45 AM  Visual Inspection No deformities, no ulcerations, no other skin breakdown bilaterally: Yes Sensation Testing Intact to touch and monofilament testing bilaterally: Yes Pulse Check See comments: Yes Comments Dorsalis pedis pulses intact bilaterally      Health Maintenance  Topic Date Due   COVID-19 Vaccine (1) Never done   Zoster Vaccines- Shingrix (1 of 2) Never done   Pneumococcal Vaccine: 50+ Years (1 of 2 - PCV) 12/23/2024 (Originally 02/05/1974)   Mammogram  12/23/2024 (Originally 02/06/1995)   DEXA SCAN  12/23/2024 (Originally 02/06/2020)   Colonoscopy  12/23/2024 (Originally 02/06/2000)   Lung Cancer Screening  07/29/2024   Diabetic kidney evaluation - Urine ACR  08/21/2024    OPHTHALMOLOGY EXAM  11/21/2024   HEMOGLOBIN A1C  12/21/2024   Diabetic kidney evaluation - eGFR measurement  01/07/2025   FOOT EXAM  06/23/2025   DTaP/Tdap/Td (2 - Td or Tdap) 07/25/2030   Influenza Vaccine  Completed   Hepatitis C Screening  Completed   Meningococcal B Vaccine  Aged Out    Assessment and plan: Type 2 diabetes mellitus with other specified complication, with long-term current use of insulin  [facility]) Assessment & Plan: Lab Results  Component Value Date   HGBA1C 5.9 06/23/2024   HGBA1C 5.5 02/21/2024   HGBA1C 6.8 (A) 11/26/2023   Chronic, stable. No hypoglycemia. Encouraged for positive lifestyle changes including diet lower in  carbohydrates and sugar. Continue Trulicity  1.5 mg weekly, Tresiba  20 units daily and Farxiga  10 mg daily.   Orders: -     POCT glycosylated hemoglobin (Hb A1C) -     Lipid panel -     Insulin  Degludec; Inject 20 Units into the skin daily.  Hypertension, unspecified type Assessment & Plan: BP Readings from Last 3 Encounters:  06/23/24 136/65  06/16/24 137/77  03/06/24 (!) 140/80   Chronic, not at goal. 130s to 140s systolic at home, never 120s. Increase amlodipine  to 10 mg daily. Continue carvedilol  3.125 mg bid and losartan  25 mg daily.  Orders: -     amLODIPine  Besylate; Take 1 tablet (10 mg total) by mouth daily.  Dispense: 90 tablet; Refill: 3  Neuropathy due to type 2 diabetes mellitus (HCC) Assessment & Plan: Subacute. Describes numbness in both feet, worse at night. Foot exam is normal. She is taking over the counter B12 supplements. Check CBC, BMP, TSH, B12. She'll need to return for these labs since these orders were placed after she left.  Orders: -     Gabapentin; Take 1 capsule (100 mg total) by mouth at bedtime.  Dispense: 30 capsule; Refill: 2 -     CBC; Future -     Vitamin B12; Future -     Basic metabolic panel with GFR; Future -     TSH; Future  Flu vaccine need -     Flu vaccine, recombinant, trivalent,  inj  Encounter for immunization -     Flu vaccine HIGH DOSE PF(Fluzone Trivalent)  Annual physical exam Assessment & Plan: Flu vaccine done today.   Other orders -     Glucose, capillary    Return in about 3 months (around 09/23/2024) for follow-up, diabetes, hypertension.  Ozell Kung MD 06/24/2024, 9:31 PM

## 2024-06-23 NOTE — Assessment & Plan Note (Signed)
 Subacute. Describes numbness in both feet, worse at night. Foot exam is normal. She is taking over the counter B12 supplements. Check CBC, BMP, TSH, B12. She'll need to return for these labs since these orders were placed after she left.

## 2024-06-23 NOTE — Assessment & Plan Note (Addendum)
 BP Readings from Last 3 Encounters:  06/23/24 136/65  06/16/24 137/77  03/06/24 (!) 140/80   Chronic, not at goal. 130s to 140s systolic at home, never 120s. Increase amlodipine  to 10 mg daily. Continue carvedilol  3.125 mg bid and losartan  25 mg daily.

## 2024-06-24 LAB — LIPID PANEL
Chol/HDL Ratio: 3.3 ratio (ref 0.0–4.4)
Cholesterol, Total: 179 mg/dL (ref 100–199)
HDL: 55 mg/dL (ref >39)
LDL Chol Calc (NIH): 106 mg/dL — ABNORMAL HIGH (ref 0–99)
Triglycerides: 99 mg/dL (ref 0–149)
VLDL Cholesterol Cal: 18 mg/dL (ref 5–40)

## 2024-06-25 ENCOUNTER — Ambulatory Visit: Payer: Self-pay | Admitting: Student

## 2024-06-25 MED ORDER — ATORVASTATIN CALCIUM 40 MG PO TABS: 40.0000 mg | ORAL_TABLET | Freq: Every day | ORAL | 3 refills | Status: AC

## 2024-07-01 NOTE — Progress Notes (Signed)
 Internal Medicine Clinic Attending  Case discussed with the resident at the time of the visit.  We reviewed the resident's history and exam and pertinent patient test results.  I agree with the assessment, diagnosis, and plan of care documented in the resident's note.

## 2024-07-14 ENCOUNTER — Encounter

## 2024-07-14 ENCOUNTER — Other Ambulatory Visit: Payer: Self-pay

## 2024-07-14 ENCOUNTER — Ambulatory Visit

## 2024-07-14 MED ORDER — CARVEDILOL 3.125 MG PO TABS: 3.1250 mg | ORAL_TABLET | Freq: Two times a day (BID) | ORAL | 1 refills | Status: DC

## 2024-07-15 LAB — CUP PACEART REMOTE DEVICE CHECK
Date Time Interrogation Session: 20251207234127
Implantable Pulse Generator Implant Date: 20250731

## 2024-07-16 ENCOUNTER — Ambulatory Visit: Payer: Self-pay | Admitting: Cardiology

## 2024-07-16 ENCOUNTER — Ambulatory Visit: Admitting: Dermatology

## 2024-07-16 ENCOUNTER — Encounter: Payer: Self-pay | Admitting: Dermatology

## 2024-07-16 VITALS — BP 110/81

## 2024-07-16 DIAGNOSIS — L219 Seborrheic dermatitis, unspecified: Secondary | ICD-10-CM

## 2024-07-16 MED ORDER — SODIUM SULFACETAMIDE WASH 10 % EX LIQD: 1.0000 | Freq: Every day | CUTANEOUS | 11 refills | Status: AC

## 2024-07-16 NOTE — Progress Notes (Signed)
 Remote Loop Recorder Transmission

## 2024-07-16 NOTE — Progress Notes (Signed)
° °  New Patient Visit   Subjective  Maria Rose is a 68 y.o. female who presents for a NEW PATIENT appointment to be examined for the concerns as listed below.  Patient (and/or pt guardian) consented to the use of AI-assisted tools for note generation.  Seb Derm: Pt does not have any concerns today. She was previously having an issue with Seb Derm on her nose that her PCP Rx ketoconazole  cream that she applied BID for 2 weeks. The issues has resolved but she wanted to keep her appointment in case any other skin concerns arose.    The following portions of the chart were reviewed this encounter and updated as appropriate: medications, allergies, medical history  Review of Systems:  No other skin or systemic complaints except as noted in HPI or Assessment and Plan.  Objective  Well appearing patient in no apparent distress; mood and affect are within normal limits.   A focused examination was performed of the following areas: scalp   Relevant exam findings are noted in the Assessment and Plan.    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 Symptoms resolved with treatment but have recurred. No involvement of other areas such as the face or scalp. Avoidance of oil-based products has been beneficial in preventing recurrence.  - Prescribed sodium sulfacetamide face wash to be used daily with CeraVe face wash. Apply sodium sulfacetamide first, followed by CeraVe, then wash off. - Provided samples of Cicalfate moisturizer to prevent flares. Use at night and consider morning application if needed. - Advised to use ketoconazole  cream if symptoms recur and to contact the office if ketoconazole  is ineffective for alternative prescription options. - Scheduled annual follow-up appointment and advised to return for any concerns.    Return for SEB  DERM.   Documentation: I have reviewed the above documentation for accuracy and completeness, and I agree with the above.  I, Shirron Maranda, CMA II, am acting as scribe for:   Delon Lenis, DO

## 2024-07-16 NOTE — Patient Instructions (Addendum)
 VISIT SUMMARY:  Today, you were seen for redness and irritation on your nose, which is suspected to be seborrheic dermatitis. This is a recurring issue for you, but it has been effectively managed in the past with ketoconazole  cream and an oral medication. You have also found that avoiding pig oil-based products has helped prevent further episodes.  YOUR PLAN:  -SEBORRHEIC DERMATITIS:  Seborrheic dermatitis is a common skin condition that causes redness, irritation, and flaking, often in areas with oil-producing glands like the nose.   To manage this, you have been prescribed a sodium sulfacetamide face wash to use daily along with your CeraVe face wash.   Apply the sodium sulfacetamide cleanser  first, followed by the CeraVe, and then wash off.   You were also given samples of [place] Cicalfate moisturizer to use at night and in the morning if needed. If your symptoms recur, you should use the ketoconazole  cream and contact our office if it is not effective for alternative options.  INSTRUCTIONS:  Please use the sodium sulfacetamide face wash daily as directed, followed by the CeraVe face wash. Apply the Evensiclophate moisturizer at night and in the morning if needed. If your symptoms recur, use the ketoconazole  cream and contact our office if it is not effective. Your annual follow-up appointment has been scheduled, but feel free to return for any concerns before then.   Your prescription was sent to [facility] in [place]. A representative from [facility] will contact you within 2 business hours to verify your address and insurance information to schedule a free delivery. If for any reason you do not receive a phone call from them, please reach out to them. Their phone number is [phone] and their hours are Monday-Friday 9:00 am-5:00 pm.       Important Information  Due to recent changes in healthcare laws, you may see results of your pathology and/or laboratory studies  on MyChart before the doctors have had a chance to review them. We understand that in some cases there may be results that are confusing or concerning to you. Please understand that not all results are received at the same time and often the doctors may need to interpret multiple results in order to provide you with the best plan of care or course of treatment. Therefore, we ask that you please give us  2 business days to thoroughly review all your results before contacting the office for clarification. Should we see a critical lab result, you will be contacted sooner.   If You Need Anything After Your Visit  If you have any questions or concerns for your doctor, please call our main line at [phone] If no one answers, please leave a voicemail as directed and we will return your call as soon as possible. Messages left after 4 pm will be answered the following business day.   You may also send us  a message via MyChart. We typically respond to MyChart messages within 1-2 business days.  For prescription refills, please ask your pharmacy to contact our office. Our fax number is [phone].  If you have an urgent issue when the clinic is closed that cannot wait until the next business day, you can page your doctor at the number below.    Please note that while we do our best to be available for urgent issues outside of office hours, we are not available 24/7.   If you have an urgent issue and are unable to reach us , you may choose to seek  medical care at your doctor's office, retail clinic, urgent care center, or emergency room.  If you have a medical emergency, please immediately call 911 or go to the emergency department. In the event of inclement weather, please call our main line at [phone] for an update on the status of any delays or closures.  Dermatology Medication Tips: Please keep the boxes that topical medications come in in order to help keep track of the instructions about  where and how to use these. Pharmacies typically print the medication instructions only on the boxes and not directly on the medication tubes.   If your medication is too expensive, please contact our office at [phone] or send us  a message through MyChart.   We are unable to tell what your co-pay for medications will be in advance as this is different depending on your insurance coverage. However, we may be able to find a substitute medication at lower cost or fill out paperwork to get insurance to cover a needed medication.   If a prior authorization is required to get your medication covered by your insurance company, please allow us  1-2 business days to complete this process.  Drug prices often vary depending on where the prescription is filled and some pharmacies may offer cheaper prices.  The website www.goodrx.com contains coupons for medications through different pharmacies. The prices here do not account for what the cost may be with help from insurance (it may be cheaper with your insurance), but the website can give you the price if you did not use any insurance.  - You can print the associated coupon and take it with your prescription to the pharmacy.  - You may also stop by our office during regular business hours and pick up a GoodRx coupon card.  - If you need your prescription sent electronically to a different pharmacy, notify our office through [facility] or by phone at [phone]

## 2024-07-25 ENCOUNTER — Other Ambulatory Visit: Payer: Self-pay

## 2024-07-25 DIAGNOSIS — E1169 Type 2 diabetes mellitus with other specified complication: Secondary | ICD-10-CM

## 2024-07-25 NOTE — Telephone Encounter (Signed)
 Copied from CRM #[id number]. Topic: Clinical - Medication Refill >> Jul 25, 2024  9:37 AM Suzette B wrote: Medication:  carvedilol  (COREG ) 3.125 MG tablet insulin  degludec (TRESIBA ) 100 UNIT/ML FlexTouch Pen not out of this one just need it transferred over to [facility]  Has the patient contacted their pharmacy? No: pt called in to advise that she needed to call us  to make sure it was sent to the correct pharmacy    This is the patient's preferred pharmacy:  [facility] [address], [place] - [address] [place] [address] Phone: [phone] Fax: [phone]    Is this the correct pharmacy for this prescription? Yes If no, delete pharmacy and type the correct one.   Has the prescription been filled recently? Yes  Is the patient out of the medication? Out of the Carvedilol    Has the patient been seen for an appointment in the last year OR does the patient have an upcoming appointment? Yes  Can we respond through MyChart? Yes  Agent: Please be advised that Rx refills may take up to 3 business days. We ask that you follow-up with your pharmacy.

## 2024-07-29 NOTE — Telephone Encounter (Signed)
 Called pt about her concerns with Coreg  - no answer. Mailbox full - unable to leave a message. There are refills on Coreg  - rx written 12/8 #180 x 1 RF.

## 2024-07-30 MED ORDER — CARVEDILOL 3.125 MG PO TABS: 3.1250 mg | ORAL_TABLET | Freq: Two times a day (BID) | ORAL | 1 refills | Status: AC

## 2024-07-30 MED ORDER — INSULIN DEGLUDEC 100 UNIT/ML ~~LOC~~ SOPN: 20.0000 [IU] | PEN_INJECTOR | Freq: Every day | SUBCUTANEOUS | 3 refills | Status: AC

## 2024-08-09 ENCOUNTER — Other Ambulatory Visit: Payer: Self-pay | Admitting: Student

## 2024-08-09 DIAGNOSIS — I10 Essential (primary) hypertension: Secondary | ICD-10-CM

## 2024-08-11 NOTE — Telephone Encounter (Signed)
 Medication sent to pharmacy

## 2024-08-14 ENCOUNTER — Encounter

## 2024-08-14 ENCOUNTER — Ambulatory Visit

## 2024-08-14 DIAGNOSIS — I639 Cerebral infarction, unspecified: Secondary | ICD-10-CM

## 2024-08-15 ENCOUNTER — Ambulatory Visit: Payer: Self-pay | Admitting: Cardiology

## 2024-08-15 LAB — CUP PACEART REMOTE DEVICE CHECK
Date Time Interrogation Session: 20260107233623
Implantable Pulse Generator Implant Date: 20250731

## 2024-08-19 NOTE — Progress Notes (Signed)
 Remote Loop Recorder Transmission

## 2024-08-27 ENCOUNTER — Other Ambulatory Visit: Payer: Self-pay | Admitting: Student

## 2024-08-27 ENCOUNTER — Other Ambulatory Visit: Payer: Self-pay

## 2024-08-27 DIAGNOSIS — E1100 Type 2 diabetes mellitus with hyperosmolarity without nonketotic hyperglycemic-hyperosmolar coma (NKHHC): Secondary | ICD-10-CM

## 2024-09-14 ENCOUNTER — Encounter

## 2024-09-23 ENCOUNTER — Ambulatory Visit: Admitting: Student

## 2024-10-15 ENCOUNTER — Encounter
